# Patient Record
Sex: Male | Born: 1950 | Race: White | Hispanic: No | Marital: Married | Smoking: Former smoker
Health system: Southern US, Community
[De-identification: ages and names within clinical notes are randomized; demographics above are authoritative.]

## PROBLEM LIST (undated history)

## (undated) DIAGNOSIS — H9190 Unspecified hearing loss, unspecified ear: Secondary | ICD-10-CM

## (undated) DIAGNOSIS — N529 Male erectile dysfunction, unspecified: Secondary | ICD-10-CM

## (undated) DIAGNOSIS — N400 Enlarged prostate without lower urinary tract symptoms: Secondary | ICD-10-CM

## (undated) DIAGNOSIS — E039 Hypothyroidism, unspecified: Secondary | ICD-10-CM

## (undated) DIAGNOSIS — I10 Essential (primary) hypertension: Secondary | ICD-10-CM

## (undated) DIAGNOSIS — M549 Dorsalgia, unspecified: Secondary | ICD-10-CM

## (undated) DIAGNOSIS — I4891 Unspecified atrial fibrillation: Secondary | ICD-10-CM

## (undated) DIAGNOSIS — L570 Actinic keratosis: Secondary | ICD-10-CM

## (undated) DIAGNOSIS — E291 Testicular hypofunction: Secondary | ICD-10-CM

## (undated) DIAGNOSIS — I480 Paroxysmal atrial fibrillation: Secondary | ICD-10-CM

## (undated) HISTORY — DX: Essential (primary) hypertension: I10

## (undated) HISTORY — PX: TONSILLECTOMY: SUR1361

## (undated) HISTORY — DX: Actinic keratosis: L57.0

## (undated) HISTORY — DX: Benign prostatic hyperplasia without lower urinary tract symptoms: N40.0

## (undated) HISTORY — DX: Paroxysmal atrial fibrillation: I48.0

## (undated) HISTORY — DX: Testicular hypofunction: E29.1

## (undated) HISTORY — DX: Male erectile dysfunction, unspecified: N52.9

---

## 2005-04-15 HISTORY — PX: COLONOSCOPY: SHX174

## 2005-05-05 ENCOUNTER — Ambulatory Visit: Payer: Self-pay | Admitting: General Surgery

## 2005-05-08 ENCOUNTER — Ambulatory Visit: Payer: Self-pay | Admitting: General Surgery

## 2005-11-17 ENCOUNTER — Ambulatory Visit: Payer: Self-pay | Admitting: Physician Assistant

## 2013-03-10 ENCOUNTER — Encounter: Payer: Self-pay | Admitting: General Surgery

## 2013-03-22 ENCOUNTER — Encounter: Payer: Self-pay | Admitting: General Surgery

## 2013-05-05 ENCOUNTER — Encounter: Payer: Self-pay | Admitting: General Surgery

## 2013-05-05 ENCOUNTER — Ambulatory Visit (INDEPENDENT_AMBULATORY_CARE_PROVIDER_SITE_OTHER): Payer: BC Managed Care – PPO | Admitting: General Surgery

## 2013-05-05 VITALS — BP 130/70 | HR 78 | Resp 14 | Ht 73.0 in | Wt 210.0 lb

## 2013-05-05 DIAGNOSIS — Z1211 Encounter for screening for malignant neoplasm of colon: Secondary | ICD-10-CM

## 2013-05-05 MED ORDER — POLYETHYLENE GLYCOL 3350 17 GM/SCOOP PO POWD: ORAL | Status: DC

## 2013-05-05 NOTE — Progress Notes (Signed)
Patient ID: Alexander Carey, male   DOB: [birth year], 63 y.o.   MRN: [id number]  Chief Complaint  Patient presents with  . Other    colonoscopy    HPI Alexander Carey is a 62 y.o. male here for colonoscopy discussion. His last colonoscopy was done 04/2005. He reports no problems with the bowels since his last visit. Family history is notable in that his older brother has had colonic polyps. HPI  Past Medical History  Diagnosis Date  . Enlarged prostate     Past Surgical History  Procedure Laterality Date  . Colonoscopy  04/2005    Family History  Problem Relation Age of Onset  . Ovarian cancer Mother   . Throat cancer Father     Social History History  Substance Use Topics  . Smoking status: Former Smoker -- 0.50 packs/day    Types: Cigarettes    Quit date: 09/15/2008  . Smokeless tobacco: Never Used  . Alcohol Use: Yes    No Known Allergies  Current Outpatient Prescriptions  Medication Sig Dispense Refill  . aspirin 81 MG tablet Take 81 mg by mouth daily.      Marland Kitchen dutasteride (AVODART) 0.5 MG capsule Take 0.5 mg by mouth daily.      . fish oil-omega-3 fatty acids 1000 MG capsule Take 2 g by mouth daily.      . Multiple Vitamin (MULTIVITAMIN) tablet Take 1 tablet by mouth daily.      . tamsulosin (FLOMAX) 0.4 MG CAPS capsule Take 0.4 mg by mouth.      . polyethylene glycol powder (GLYCOLAX/MIRALAX) powder 255 grams one bottle for colonoscopy prep  255 g  0   No current facility-administered medications for this visit.    Review of Systems Review of Systems  Constitutional: Negative.   Respiratory: Negative.   Cardiovascular: Negative.     Blood pressure 130/70, pulse 78, resp. rate 14, height 6\' 1"  (1.854 m), weight 210 lb (95.255 kg).  Physical Exam Physical Exam  Neck: Neck supple.  Cardiovascular: Normal rate and regular rhythm.   Pulmonary/Chest: Effort normal and breath sounds normal.  Lymphadenopathy:    He has no cervical adenopathy.    Data  Reviewed Colonoscopy in 2006 was unremarkable.  Assessment    Candidate for screening colonoscopy.     Plan    The procedure was reviewed. This will be scheduled convenient day.     Patient has been scheduled for a colonoscopy on 06-29-13 at [facility]. It is okay for patient to continue 81 mg aspirin. Patient has been asked to discontinue fish oil one week prior to procedure.    Earline Mayotte 05/06/2013, 7:48 PM

## 2013-05-05 NOTE — Patient Instructions (Addendum)
Colonoscopy A colonoscopy is an exam to evaluate your entire colon. In this exam, your colon is cleansed. A long fiberoptic tube is inserted through your rectum and into your colon. The fiberoptic scope (endoscope) is a long bundle of enclosed and very flexible fibers. These fibers transmit light to the area examined and send images from that area to your caregiver. Discomfort is usually minimal. You may be given a drug to help you sleep (sedative) during or prior to the procedure. This exam helps to detect lumps (tumors), polyps, inflammation, and areas of bleeding. Your caregiver may also take a small piece of tissue (biopsy) that will be examined under a microscope. LET YOUR CAREGIVER KNOW ABOUT:   Allergies to food or medicine.  Medicines taken, including vitamins, herbs, eyedrops, over-the-counter medicines, and creams.  Use of steroids (by mouth or creams).  Previous problems with anesthetics or numbing medicines.  History of bleeding problems or blood clots.  Previous surgery.  Other health problems, including diabetes and kidney problems.  Possibility of pregnancy, if this applies. BEFORE THE PROCEDURE   A clear liquid diet may be required for 2 days before the exam.  Ask your caregiver about changing or stopping your regular medications.  Liquid injections (enemas) or laxatives may be required.  A large amount of electrolyte solution may be given to you to drink over a short period of time. This solution is used to clean out your colon.  You should be present 60 minutes prior to your procedure or as directed by your caregiver. AFTER THE PROCEDURE   If you received a sedative or pain relieving medication, you will need to arrange for someone to drive you home.  Occasionally, there is a little blood passed with the first bowel movement. Do not be concerned. FINDING OUT THE RESULTS OF YOUR TEST Not all test results are available during your visit. If your test results are  not back during the visit, make an appointment with your caregiver to find out the results. Do not assume everything is normal if you have not heard from your caregiver or the medical facility. It is important for you to follow up on all of your test results. HOME CARE INSTRUCTIONS   It is not unusual to pass moderate amounts of gas and experience mild abdominal cramping following the procedure. This is due to air being used to inflate your colon during the exam. Walking or a warm pack on your belly (abdomen) may help.  You may resume all normal meals and activities after sedatives and medicines have worn off.  Only take over-the-counter or prescription medicines for pain, discomfort, or fever as directed by your caregiver. Do not use aspirin or blood thinners if a biopsy was taken. Consult your caregiver for medicine usage if biopsies were taken. SEEK IMMEDIATE MEDICAL CARE IF:   You have a fever.  You pass large blood clots or fill a toilet with blood following the procedure. This may also occur 10 to 14 days following the procedure. This is more likely if a biopsy was taken.  You develop abdominal pain that keeps getting worse and cannot be relieved with medicine. Document Released: 08/29/2000 Document Revised: 11/24/2011 Document Reviewed: 04/13/2008 [facility] Patient Information 2014 [place], [place].  Patient has been scheduled for a colonoscopy on 06-29-13 at [facility]. It is okay for patient to continue 81 mg aspirin. Patient has been asked to discontinue fish oil one week prior to procedure.

## 2013-05-06 ENCOUNTER — Encounter: Payer: Self-pay | Admitting: General Surgery

## 2013-06-23 ENCOUNTER — Other Ambulatory Visit: Payer: Self-pay | Admitting: General Surgery

## 2013-06-23 ENCOUNTER — Telehealth: Payer: Self-pay | Admitting: *Deleted

## 2013-06-23 DIAGNOSIS — Z1211 Encounter for screening for malignant neoplasm of colon: Secondary | ICD-10-CM

## 2013-06-23 NOTE — Telephone Encounter (Signed)
Patient reports no change in medications since last office visit. He was instructed to pre-register by Friday since he has not done so already. We will proceed with colonoscopy that is scheduled for 06-29-13 at [facility]. He was instructed to call the office if he has any questions.

## 2013-06-29 ENCOUNTER — Ambulatory Visit: Payer: Self-pay | Admitting: General Surgery

## 2013-06-29 DIAGNOSIS — Z1211 Encounter for screening for malignant neoplasm of colon: Secondary | ICD-10-CM

## 2013-06-30 ENCOUNTER — Encounter: Payer: Self-pay | Admitting: General Surgery

## 2013-07-18 ENCOUNTER — Ambulatory Visit: Payer: Self-pay | Admitting: Orthopedic Surgery

## 2014-02-01 DIAGNOSIS — M5136 Other intervertebral disc degeneration, lumbar region: Secondary | ICD-10-CM | POA: Insufficient documentation

## 2014-02-01 DIAGNOSIS — M5116 Intervertebral disc disorders with radiculopathy, lumbar region: Secondary | ICD-10-CM | POA: Insufficient documentation

## 2014-02-01 DIAGNOSIS — M48061 Spinal stenosis, lumbar region without neurogenic claudication: Secondary | ICD-10-CM | POA: Insufficient documentation

## 2014-02-01 DIAGNOSIS — M47816 Spondylosis without myelopathy or radiculopathy, lumbar region: Secondary | ICD-10-CM | POA: Insufficient documentation

## 2015-07-26 ENCOUNTER — Ambulatory Visit: Payer: Self-pay | Admitting: Obstetrics and Gynecology

## 2015-08-01 ENCOUNTER — Encounter: Payer: Self-pay | Admitting: *Deleted

## 2015-08-01 DIAGNOSIS — K30 Functional dyspepsia: Secondary | ICD-10-CM | POA: Insufficient documentation

## 2015-08-01 DIAGNOSIS — R195 Other fecal abnormalities: Secondary | ICD-10-CM | POA: Insufficient documentation

## 2015-08-01 DIAGNOSIS — N400 Enlarged prostate without lower urinary tract symptoms: Secondary | ICD-10-CM | POA: Insufficient documentation

## 2015-08-01 DIAGNOSIS — J302 Other seasonal allergic rhinitis: Secondary | ICD-10-CM | POA: Insufficient documentation

## 2015-08-01 DIAGNOSIS — E785 Hyperlipidemia, unspecified: Secondary | ICD-10-CM | POA: Insufficient documentation

## 2015-08-02 ENCOUNTER — Ambulatory Visit (INDEPENDENT_AMBULATORY_CARE_PROVIDER_SITE_OTHER): Payer: PPO | Admitting: Obstetrics and Gynecology

## 2015-08-02 ENCOUNTER — Encounter: Payer: Self-pay | Admitting: Obstetrics and Gynecology

## 2015-08-02 VITALS — BP 161/101 | HR 69 | Resp 16 | Ht 71.0 in | Wt 213.3 lb

## 2015-08-02 DIAGNOSIS — N400 Enlarged prostate without lower urinary tract symptoms: Secondary | ICD-10-CM

## 2015-08-02 LAB — BLADDER SCAN AMB NON-IMAGING

## 2015-08-02 MED ORDER — DUTASTERIDE 0.5 MG PO CAPS: 0.5000 mg | ORAL_CAPSULE | Freq: Every day | ORAL | Status: DC

## 2015-08-02 MED ORDER — TAMSULOSIN HCL 0.4 MG PO CAPS: 0.4000 mg | ORAL_CAPSULE | Freq: Every day | ORAL | Status: DC

## 2015-08-02 NOTE — Progress Notes (Addendum)
08/02/2015 9:36 AM   Alexander Carey April 05, 1951 [id number]  Referring provider: Rocco Serene, MD [place], [place] [address]  Chief Complaint  Patient presents with  . Benign Prostatic Hypertrophy    HPI: Patient is a 64 year old male presenting today for his 1 year follow-up for BPH. Patient reports that urinary symptoms have been well controlled on daily Avodart and Flomax for at least 3 years. He states that his urinary symptoms have remained stable. He reports no adverse side effects of medications and would like to continue current treatment.    The current IPSS (International Prostate Symptom Score) is 12 and QOL (Quality of Life) score due to urinary symptoms is 1 pleased. Last Visit: 07/2014  PSA History 12/2012   PSA 0.2 07/2014 PSA 0.3  PMH: Past Medical History  Diagnosis Date  . Enlarged prostate   . ED (erectile dysfunction)   . Hypogonadism in male     Surgical History: Past Surgical History  Procedure Laterality Date  . Colonoscopy  04/2005  . Tonsillectomy      Home Medications:    Medication List       This list is accurate as of: 08/02/15  9:36 AM.  Always use your most recent med list.               aspirin 81 MG tablet  Take 81 mg by mouth daily.     dutasteride 0.5 MG capsule  Commonly known as:  AVODART  Take 1 capsule (0.5 mg total) by mouth daily.     multivitamin tablet  Take 1 tablet by mouth daily.     tamsulosin 0.4 MG Caps capsule  Commonly known as:  FLOMAX  Take 1 capsule (0.4 mg total) by mouth daily.        Allergies:  Allergies  Allergen Reactions  . Ciprofloxacin     Other reaction(s): Unknown    Family History: Family History  Problem Relation Age of Onset  . Ovarian cancer Mother   . Throat cancer Father     Social History:  reports that he quit smoking about 6 years ago. His smoking use included Cigarettes. He smoked 0.50 packs per day. He has never used smokeless tobacco. He reports  that he drinks alcohol. He reports that he does not use illicit drugs.  ROS: UROLOGY Frequent Urination?: No Hard to postpone urination?: No Burning/pain with urination?: No Get up at night to urinate?: No Leakage of urine?: No Urine stream starts and stops?: No Trouble starting stream?: No Do you have to strain to urinate?: No Blood in urine?: No Urinary tract infection?: No Sexually transmitted disease?: No Injury to kidneys or bladder?: No Painful intercourse?: No Weak stream?: No Erection problems?: No Penile pain?: No  Gastrointestinal Nausea?: No Vomiting?: No Indigestion/heartburn?: No Diarrhea?: No Constipation?: No  Constitutional Fever: No Night sweats?: No Weight loss?: No Fatigue?: No  Skin Skin rash/lesions?: No Itching?: No  Eyes Blurred vision?: No Double vision?: No  Ears/Nose/Throat Sore throat?: No Sinus problems?: No  Hematologic/Lymphatic Swollen glands?: No Easy bruising?: No  Cardiovascular Leg swelling?: No Chest pain?: No  Respiratory Cough?: No Shortness of breath?: No  Endocrine Excessive thirst?: No  Musculoskeletal Back pain?: No Joint pain?: No  Neurological Headaches?: No Dizziness?: No  Psychologic Depression?: No Anxiety?: No  Physical Exam: BP 161/101 mmHg  Pulse 69  Resp 16  Ht 5\' 11"  (1.803 m)  Wt 213 lb 4.8 oz (96.752 kg)  BMI 29.76 kg/m2  Constitutional:  Alert and oriented, No acute distress. HEENT: [place] AT, moist mucus membranes.  Trachea midline, no masses. Cardiovascular: No clubbing, cyanosis, or edema. Respiratory: Normal respiratory effort, no increased work of breathing. GI: Abdomen is soft, nontender, nondistended, no abdominal masses GU:  DRE: prostate normal in size and smooth, nontender Skin: No rashes, bruises or suspicious lesions. Lymph: No cervical adenopathy. Neurologic: Grossly intact, no focal deficits, moving all 4 extremities. Psychiatric: Normal mood and  affect.  Laboratory Data:   Urinalysis Results for orders placed or performed in visit on 08/02/15  BLADDER SCAN AMB NON-IMAGING  Result Value Ref Range   Scan Result 12 mL     Pertinent Imaging:   Assessment & Plan:   1. BPH (benign prostatic hyperplasia)-  PVR 92mL. Patient's urinary symptoms are well managed on Flomax and Avodart. We will continue these medications and refill sent to his pharmacy today. PSA checked today. DRE unremarkable. Patient to follow up in 1 year for DRE and PSA. - BLADDER SCAN AMB NON-IMAGING -Flomax -Advodart   Return in about 1 year (around 08/01/2016) for DRE/PSA/I-PSS.  These notes generated with voice recognition software. I apologize for typographical errors.  Herbert Moors, [facility] [address], [place],  [address] [phone]

## 2015-08-02 NOTE — Addendum Note (Signed)
Addended by: Roda Shutters on: 08/02/2015 09:39 AM   Modules accepted: Level of Service

## 2015-08-03 LAB — PSA: PROSTATE SPECIFIC AG, SERUM: 0.3 ng/mL (ref 0.0–4.0)

## 2015-08-06 ENCOUNTER — Telehealth: Payer: Self-pay

## 2015-08-06 NOTE — Telephone Encounter (Signed)
LMOM- labs are good and well see in 1 year.

## 2015-08-06 NOTE — Telephone Encounter (Signed)
-----   Message from Roda Shutters, [place] sent at 08/03/2015  9:24 AM EST ----- Please notify patient that his PSA 0.3 which is within normal limits and stable from last years. We will see him in 1 year as scheduled.  thanks

## 2015-11-29 DIAGNOSIS — J019 Acute sinusitis, unspecified: Secondary | ICD-10-CM | POA: Diagnosis not present

## 2015-11-29 DIAGNOSIS — B9689 Other specified bacterial agents as the cause of diseases classified elsewhere: Secondary | ICD-10-CM | POA: Diagnosis not present

## 2016-01-21 DIAGNOSIS — K130 Diseases of lips: Secondary | ICD-10-CM | POA: Diagnosis not present

## 2016-07-16 DIAGNOSIS — L578 Other skin changes due to chronic exposure to nonionizing radiation: Secondary | ICD-10-CM | POA: Diagnosis not present

## 2016-07-16 DIAGNOSIS — I8311 Varicose veins of right lower extremity with inflammation: Secondary | ICD-10-CM | POA: Diagnosis not present

## 2016-07-16 DIAGNOSIS — L821 Other seborrheic keratosis: Secondary | ICD-10-CM | POA: Diagnosis not present

## 2016-07-16 DIAGNOSIS — Z1283 Encounter for screening for malignant neoplasm of skin: Secondary | ICD-10-CM | POA: Diagnosis not present

## 2016-07-16 DIAGNOSIS — B353 Tinea pedis: Secondary | ICD-10-CM | POA: Diagnosis not present

## 2016-07-16 DIAGNOSIS — B351 Tinea unguium: Secondary | ICD-10-CM | POA: Diagnosis not present

## 2016-07-16 DIAGNOSIS — L570 Actinic keratosis: Secondary | ICD-10-CM | POA: Diagnosis not present

## 2016-07-16 DIAGNOSIS — L853 Xerosis cutis: Secondary | ICD-10-CM | POA: Diagnosis not present

## 2016-07-16 DIAGNOSIS — L309 Dermatitis, unspecified: Secondary | ICD-10-CM | POA: Diagnosis not present

## 2016-07-16 DIAGNOSIS — L718 Other rosacea: Secondary | ICD-10-CM | POA: Diagnosis not present

## 2016-07-16 DIAGNOSIS — L812 Freckles: Secondary | ICD-10-CM | POA: Diagnosis not present

## 2016-09-21 NOTE — Progress Notes (Signed)
09/22/2016 9:16 AM   [name] [birth year] [id number]  Referring provider: Rocco Serene, MD [facility] [place], [place] [address]  Chief Complaint  Patient presents with  . Benign Prostatic Hypertrophy    1 year follow up needs med refill    HPI: Patient is a 66 year old Caucasian male with BPH with LU TS and erectile dysfunction who presents today for yearly follow-up.  BPH WITH LUTS His IPSS score today is 8, which is moderate lower urinary tract symptomatology.  He is pleased with his quality life due to his urinary symptoms.  His previous IPSS score was 12/1.   His major complaints today are intermittency and a weak urinary stream.  He has had these symptoms for the last few years.  He denies any dysuria, hematuria or suprapubic pain.   He currently taking tamsulosin 0.4 mg and dutasteride 0.5 mg daily.  He also denies any recent fevers, chills, nausea or vomiting.  He does not have a family history of PCa.     IPSS    Row Name 09/22/16 0900         International Prostate Symptom Score   How often have you had the sensation of not emptying your bladder? Less than 1 in 5     How often have you had to urinate less than every two hours? Less than 1 in 5 times     How often have you found you stopped and started again several times when you urinated? Less than half the time     How often have you found it difficult to postpone urination? Not at All     How often have you had a weak urinary stream? Less than half the time     How often have you had to strain to start urination? Less than 1 in 5 times     How many times did you typically get up at night to urinate? 1 Time     Total IPSS Score 8       Quality of Life due to urinary symptoms   If you were to spend the rest of your life with your urinary condition just the way it is now how would you feel about that? Pleased        Score:  1-7 Mild 8-19 Moderate 20-35 Severe  Erectile dysfunction His SHIM  score is 16, which is mild to moderate ED.   He has been having difficulty with erections for several years.   His major complaint is lack of firmness.  His libido is preserved.   His risk factors for ED are age, BPH, HTN and HLD.  He denies any painful erections or curvatures with his erections.   He has tried Cialis in the past, he would like to have a prescription.       SHIM    Row Name 09/22/16 0901         SHIM: Over the last 6 months:   How do you rate your confidence that you could get and keep an erection? Moderate     When you had erections with sexual stimulation, how often were your erections hard enough for penetration (entering your partner)? Sometimes (about half the time)     During sexual intercourse, how often were you able to maintain your erection after you had penetrated (entered) your partner? Difficult     During sexual intercourse, how difficult was it to maintain your erection to completion of intercourse?  Slightly Difficult     When you attempted sexual intercourse, how often was it satisfactory for you? Difficult       SHIM Total Score   SHIM 16        Score: 1-7 Severe ED 8-11 Moderate ED 12-16 Mild-Moderate ED 17-21 Mild ED 22-25 No ED    PMH: Past Medical History:  Diagnosis Date  . ED (erectile dysfunction)   . Enlarged prostate   . Hypogonadism in male     Surgical History: Past Surgical History:  Procedure Laterality Date  . COLONOSCOPY  04/2005  . TONSILLECTOMY      Home Medications:  Allergies as of 09/22/2016      Reactions   Ciprofloxacin    Other reaction(s): Unknown      Medication List       Accurate as of 09/22/16  9:16 AM. Always use your most recent med list.          aspirin 81 MG tablet Take 81 mg by mouth daily.   dutasteride 0.5 MG capsule Commonly known as:  AVODART Take 1 capsule (0.5 mg total) by mouth daily.   mometasone 0.1 % ointment Commonly known as:  ELOCON Apply topically.   multivitamin  tablet Take 1 tablet by mouth daily.   tadalafil 20 MG tablet Commonly known as:  CIALIS Take 1 tablet (20 mg total) by mouth daily as needed for erectile dysfunction.   tamsulosin 0.4 MG Caps capsule Commonly known as:  FLOMAX Take 1 capsule (0.4 mg total) by mouth daily.       Allergies:  Allergies  Allergen Reactions  . Ciprofloxacin     Other reaction(s): Unknown    Family History: Family History  Problem Relation Age of Onset  . Ovarian cancer Mother   . Throat cancer Father   . Prostate cancer Neg Hx   . Kidney cancer Neg Hx     Social History:  reports that he quit smoking about 8 years ago. His smoking use included Cigarettes. He smoked 0.50 packs per day. He has never used smokeless tobacco. He reports that he drinks alcohol. He reports that he does not use drugs.  ROS: UROLOGY Frequent Urination?: No Hard to postpone urination?: No Burning/pain with urination?: No Get up at night to urinate?: No Leakage of urine?: No Urine stream starts and stops?: No Trouble starting stream?: No Do you have to strain to urinate?: No Blood in urine?: No Urinary tract infection?: No Sexually transmitted disease?: No Injury to kidneys or bladder?: No Painful intercourse?: No Weak stream?: No Erection problems?: No Penile pain?: No  Gastrointestinal Nausea?: No Vomiting?: No Indigestion/heartburn?: No Diarrhea?: No Constipation?: No  Constitutional Fever: No Night sweats?: No Weight loss?: No Fatigue?: No  Skin Skin rash/lesions?: No Itching?: No  Eyes Blurred vision?: No Double vision?: No  Ears/Nose/Throat Sore throat?: No Sinus problems?: No  Hematologic/Lymphatic Swollen glands?: No Easy bruising?: No  Cardiovascular Leg swelling?: No Chest pain?: No  Respiratory Cough?: No Shortness of breath?: No  Endocrine Excessive thirst?: No  Musculoskeletal Back pain?: No Joint pain?: No  Neurological Headaches?: No Dizziness?:  No  Psychologic Depression?: No Anxiety?: No  Physical Exam: BP (!) 163/82   Pulse 75   Ht 6' (1.829 m)   Wt 209 lb 6.4 oz (95 kg)   BMI 28.40 kg/m   Constitutional: Well nourished. Alert and oriented, No acute distress. HEENT: [place] AT, moist mucus membranes. Trachea midline, no masses. Cardiovascular: No clubbing, cyanosis, or edema.  Respiratory: Normal respiratory effort, no increased work of breathing. GI: Abdomen is soft, non tender, non distended, no abdominal masses. Liver and spleen not palpable.  No hernias appreciated.  Stool sample for occult testing is not indicated.   GU: No CVA tenderness.  No bladder fullness or masses.  Patient with uncircumcised phallus.  Foreskin easily retracted  Urethral meatus is patent.  No penile discharge. No penile lesions or rashes. Scrotum without lesions, cysts, rashes and/or edema.  Testicles are located scrotally bilaterally. No masses are appreciated in the testicles. Left and right epididymis are normal. Rectal: Patient with  normal sphincter tone. Anus and perineum without scarring or rashes. No rectal masses are appreciated. Prostate is approximately 45 grams, no nodules are appreciated. Seminal vesicles are normal. Skin: No rashes, bruises or suspicious lesions. Lymph: No cervical or inguinal adenopathy. Neurologic: Grossly intact, no focal deficits, moving all 4 extremities. Psychiatric: Normal mood and affect.  Laboratory Data: PSA History 12/2012   PSA 0.2 07/2014 PSA 0.3   Assessment & Plan:    1. BPH with LUTS  - IPSS score is 8/1, it is improving  - Continue conservative management, avoiding bladder irritants and timed voiding's  - Continue tamsulosin 0.4 mg daily and dutasteride 0.5 mg daily; refills given  - RTC in 12 months for IPSS, PSA and exam    2. Erectile dysfunction  - SHIM score is 16   - Continue Cialis; script sent to pharmacy  - RTC in 12 months for repeat SHIM score and exam    Return in about 1 year  (around 09/22/2017) for IPSS, SHIM, PSA and exam.  These notes generated with voice recognition software. I apologize for typographical errors.  Zara Council, [facility] [address], [place], [place] [address] [phone]

## 2016-09-22 ENCOUNTER — Encounter: Payer: Self-pay | Admitting: Urology

## 2016-09-22 ENCOUNTER — Ambulatory Visit: Payer: PPO | Admitting: Urology

## 2016-09-22 VITALS — BP 163/82 | HR 75 | Ht 72.0 in | Wt 209.4 lb

## 2016-09-22 DIAGNOSIS — N401 Enlarged prostate with lower urinary tract symptoms: Secondary | ICD-10-CM | POA: Diagnosis not present

## 2016-09-22 DIAGNOSIS — L2389 Allergic contact dermatitis due to other agents: Secondary | ICD-10-CM | POA: Diagnosis not present

## 2016-09-22 DIAGNOSIS — N138 Other obstructive and reflux uropathy: Secondary | ICD-10-CM | POA: Diagnosis not present

## 2016-09-22 DIAGNOSIS — N529 Male erectile dysfunction, unspecified: Secondary | ICD-10-CM | POA: Diagnosis not present

## 2016-09-22 DIAGNOSIS — L2089 Other atopic dermatitis: Secondary | ICD-10-CM | POA: Diagnosis not present

## 2016-09-22 DIAGNOSIS — N400 Enlarged prostate without lower urinary tract symptoms: Secondary | ICD-10-CM | POA: Diagnosis not present

## 2016-09-22 DIAGNOSIS — L308 Other specified dermatitis: Secondary | ICD-10-CM | POA: Diagnosis not present

## 2016-09-22 MED ORDER — DUTASTERIDE 0.5 MG PO CAPS: 0.5000 mg | ORAL_CAPSULE | Freq: Every day | ORAL | 3 refills | Status: DC

## 2016-09-22 MED ORDER — TAMSULOSIN HCL 0.4 MG PO CAPS
0.4000 mg | ORAL_CAPSULE | Freq: Every day | ORAL | 12 refills | Status: DC
Start: 2016-09-22 — End: 2016-09-22

## 2016-09-22 MED ORDER — DUTASTERIDE 0.5 MG PO CAPS: 0.5000 mg | ORAL_CAPSULE | Freq: Every day | ORAL | 12 refills | Status: DC

## 2016-09-22 MED ORDER — TAMSULOSIN HCL 0.4 MG PO CAPS
0.4000 mg | ORAL_CAPSULE | Freq: Every day | ORAL | 3 refills | Status: DC
Start: 2016-09-22 — End: 2017-09-22

## 2016-09-22 MED ORDER — TADALAFIL 20 MG PO TABS: 20.0000 mg | ORAL_TABLET | Freq: Every day | ORAL | 12 refills | Status: DC | PRN

## 2016-09-23 LAB — PSA: PROSTATE SPECIFIC AG, SERUM: 0.4 ng/mL (ref 0.0–4.0)

## 2016-09-25 ENCOUNTER — Telehealth: Payer: Self-pay

## 2016-09-25 NOTE — Telephone Encounter (Signed)
LMOM

## 2016-09-25 NOTE — Telephone Encounter (Signed)
-----   Message from Nori Riis, PA-C sent at 09/24/2016  7:43 AM EST ----- Please notify the patient that his PSA is stable at 0.4 ng/mL.  We will see him in one year.

## 2016-09-25 NOTE — Telephone Encounter (Signed)
Spoke with pt in reference to PSA results. Pt voiced understanding.  

## 2016-10-06 DIAGNOSIS — M19011 Primary osteoarthritis, right shoulder: Secondary | ICD-10-CM | POA: Diagnosis not present

## 2016-10-06 DIAGNOSIS — M542 Cervicalgia: Secondary | ICD-10-CM | POA: Diagnosis not present

## 2016-10-06 DIAGNOSIS — M25511 Pain in right shoulder: Secondary | ICD-10-CM | POA: Diagnosis not present

## 2016-10-06 DIAGNOSIS — M47812 Spondylosis without myelopathy or radiculopathy, cervical region: Secondary | ICD-10-CM | POA: Insufficient documentation

## 2016-10-06 DIAGNOSIS — L300 Nummular dermatitis: Secondary | ICD-10-CM | POA: Diagnosis not present

## 2016-10-06 DIAGNOSIS — M7581 Other shoulder lesions, right shoulder: Secondary | ICD-10-CM | POA: Insufficient documentation

## 2016-10-07 ENCOUNTER — Other Ambulatory Visit: Payer: Self-pay | Admitting: Surgery

## 2016-10-07 DIAGNOSIS — M25511 Pain in right shoulder: Secondary | ICD-10-CM

## 2016-10-14 DIAGNOSIS — D2372 Other benign neoplasm of skin of left lower limb, including hip: Secondary | ICD-10-CM | POA: Diagnosis not present

## 2016-10-14 DIAGNOSIS — M79672 Pain in left foot: Secondary | ICD-10-CM | POA: Diagnosis not present

## 2016-10-22 ENCOUNTER — Ambulatory Visit
Admission: RE | Admit: 2016-10-22 | Discharge: 2016-10-22 | Disposition: A | Discharge status: Home / Self Care | Payer: PPO | Source: Ambulatory Visit | Attending: Surgery | Admitting: Surgery

## 2016-10-22 DIAGNOSIS — M25511 Pain in right shoulder: Secondary | ICD-10-CM | POA: Insufficient documentation

## 2016-10-22 DIAGNOSIS — M25811 Other specified joint disorders, right shoulder: Secondary | ICD-10-CM | POA: Insufficient documentation

## 2016-10-22 DIAGNOSIS — M25411 Effusion, right shoulder: Secondary | ICD-10-CM | POA: Diagnosis not present

## 2016-10-22 DIAGNOSIS — M75101 Unspecified rotator cuff tear or rupture of right shoulder, not specified as traumatic: Secondary | ICD-10-CM | POA: Insufficient documentation

## 2016-10-22 DIAGNOSIS — M24011 Loose body in right shoulder: Secondary | ICD-10-CM | POA: Insufficient documentation

## 2016-10-22 DIAGNOSIS — M19011 Primary osteoarthritis, right shoulder: Secondary | ICD-10-CM | POA: Diagnosis not present

## 2016-10-27 DIAGNOSIS — M75111 Incomplete rotator cuff tear or rupture of right shoulder, not specified as traumatic: Secondary | ICD-10-CM | POA: Diagnosis not present

## 2016-10-27 DIAGNOSIS — M720 Palmar fascial fibromatosis [Dupuytren]: Secondary | ICD-10-CM | POA: Diagnosis not present

## 2016-10-27 DIAGNOSIS — M19011 Primary osteoarthritis, right shoulder: Secondary | ICD-10-CM | POA: Diagnosis not present

## 2016-10-27 DIAGNOSIS — M7581 Other shoulder lesions, right shoulder: Secondary | ICD-10-CM | POA: Diagnosis not present

## 2016-11-04 DIAGNOSIS — M79672 Pain in left foot: Secondary | ICD-10-CM | POA: Diagnosis not present

## 2016-11-04 DIAGNOSIS — D2372 Other benign neoplasm of skin of left lower limb, including hip: Secondary | ICD-10-CM | POA: Diagnosis not present

## 2016-11-26 ENCOUNTER — Encounter: Payer: Self-pay | Admitting: *Deleted

## 2016-12-03 ENCOUNTER — Ambulatory Visit
Admission: RE | Admit: 2016-12-03 | Discharge: 2016-12-03 | Disposition: A | Discharge status: Home / Self Care | Payer: PPO | Source: Ambulatory Visit | Attending: Surgery | Admitting: Surgery

## 2016-12-03 ENCOUNTER — Ambulatory Visit: Payer: PPO | Admitting: Anesthesiology

## 2016-12-03 ENCOUNTER — Encounter
Admission: RE | Disposition: A | Discharge status: Home / Self Care | Payer: Self-pay | Source: Ambulatory Visit | Attending: Surgery

## 2016-12-03 DIAGNOSIS — Z7982 Long term (current) use of aspirin: Secondary | ICD-10-CM | POA: Diagnosis not present

## 2016-12-03 DIAGNOSIS — M720 Palmar fascial fibromatosis [Dupuytren]: Secondary | ICD-10-CM | POA: Insufficient documentation

## 2016-12-03 DIAGNOSIS — N400 Enlarged prostate without lower urinary tract symptoms: Secondary | ICD-10-CM | POA: Diagnosis not present

## 2016-12-03 DIAGNOSIS — Z881 Allergy status to other antibiotic agents status: Secondary | ICD-10-CM | POA: Insufficient documentation

## 2016-12-03 DIAGNOSIS — Z79899 Other long term (current) drug therapy: Secondary | ICD-10-CM | POA: Insufficient documentation

## 2016-12-03 DIAGNOSIS — Z87891 Personal history of nicotine dependence: Secondary | ICD-10-CM | POA: Insufficient documentation

## 2016-12-03 HISTORY — PX: TRIGGER FINGER RELEASE: SHX641

## 2016-12-03 SURGERY — RELEASE, A1 PULLEY, FOR TRIGGER FINGER
Anesthesia: Monitor Anesthesia Care | Laterality: Left | Wound class: Clean

## 2016-12-03 MED ORDER — ROPIVACAINE HCL 5 MG/ML IJ SOLN
INTRAMUSCULAR | Status: DC | PRN
  Administered 2016-12-03: 40 mL via PERINEURAL

## 2016-12-03 MED ORDER — OXYCODONE HCL 5 MG PO TABS: 5.0000 mg | ORAL_TABLET | Freq: Once | ORAL | Status: DC | PRN

## 2016-12-03 MED ORDER — LACTATED RINGERS IV SOLN
INTRAVENOUS | Status: DC
  Administered 2016-12-03: 12:00:00 via INTRAVENOUS

## 2016-12-03 MED ORDER — BUPIVACAINE HCL (PF) 0.25 % IJ SOLN
INTRAMUSCULAR | Status: DC | PRN
  Administered 2016-12-03: 10 mL

## 2016-12-03 MED ORDER — HYDROCODONE-ACETAMINOPHEN 5-325 MG PO TABS: 1.0000 | ORAL_TABLET | Freq: Four times a day (QID) | ORAL | 0 refills | Status: DC | PRN

## 2016-12-03 MED ORDER — LIDOCAINE HCL (CARDIAC) 20 MG/ML IV SOLN
INTRAVENOUS | Status: DC | PRN
  Administered 2016-12-03: 50 mg via INTRAVENOUS

## 2016-12-03 MED ORDER — DEXAMETHASONE SODIUM PHOSPHATE 4 MG/ML IJ SOLN
INTRAMUSCULAR | Status: DC | PRN
  Administered 2016-12-03: 4 mg via PERINEURAL

## 2016-12-03 MED ORDER — FENTANYL CITRATE (PF) 100 MCG/2ML IJ SOLN
INTRAMUSCULAR | Status: DC | PRN
  Administered 2016-12-03: 100 ug via INTRAVENOUS

## 2016-12-03 MED ORDER — PROMETHAZINE HCL 25 MG/ML IJ SOLN: 6.2500 mg | INTRAMUSCULAR | Status: DC | PRN

## 2016-12-03 MED ORDER — PROPOFOL 500 MG/50ML IV EMUL
INTRAVENOUS | Status: DC | PRN
  Administered 2016-12-03: 75 ug/kg/min via INTRAVENOUS

## 2016-12-03 MED ORDER — OXYCODONE HCL 5 MG/5ML PO SOLN: 5.0000 mg | Freq: Once | ORAL | Status: DC | PRN

## 2016-12-03 MED ORDER — MIDAZOLAM HCL 2 MG/2ML IJ SOLN
INTRAMUSCULAR | Status: DC | PRN
  Administered 2016-12-03: 2 mg via INTRAVENOUS

## 2016-12-03 MED ORDER — MEPERIDINE HCL 25 MG/ML IJ SOLN: 6.2500 mg | INTRAMUSCULAR | Status: DC | PRN

## 2016-12-03 MED ORDER — CEFAZOLIN SODIUM-DEXTROSE 2-4 GM/100ML-% IV SOLN
2.0000 g | Freq: Once | INTRAVENOUS | Status: AC
  Administered 2016-12-03: 2 g via INTRAVENOUS

## 2016-12-03 MED ORDER — HYDROMORPHONE HCL 1 MG/ML IJ SOLN: 0.2500 mg | INTRAMUSCULAR | Status: DC | PRN

## 2016-12-03 SURGICAL SUPPLY — 26 items
BANDAGE ELASTIC 2 LF NS (GAUZE/BANDAGES/DRESSINGS) IMPLANT
BANDAGE ELASTIC 3 LF NS (GAUZE/BANDAGES/DRESSINGS) ×3 IMPLANT
BNDG ESMARK 4X12 TAN STRL LF (GAUZE/BANDAGES/DRESSINGS) ×3 IMPLANT
CHLORAPREP W/TINT 26ML (MISCELLANEOUS) ×3 IMPLANT
CORD BIP STRL DISP 12FT (MISCELLANEOUS) ×3 IMPLANT
COVER LIGHT HANDLE UNIVERSAL (MISCELLANEOUS) ×6 IMPLANT
CUFF TOURNIQUET DUAL PORT 18X3 (MISCELLANEOUS) ×3 IMPLANT
DECANTER SPIKE VIAL GLASS SM (MISCELLANEOUS) IMPLANT
GAUZE PETRO XEROFOAM 1X8 (MISCELLANEOUS) ×3 IMPLANT
GAUZE SPONGE 4X4 12PLY STRL (GAUZE/BANDAGES/DRESSINGS) ×3 IMPLANT
GLOVE BIO SURGEON STRL SZ8 (GLOVE) ×6 IMPLANT
GLOVE INDICATOR 8.0 STRL GRN (GLOVE) ×3 IMPLANT
GOWN STRL REUS W/ TWL LRG LVL3 (GOWN DISPOSABLE) ×1 IMPLANT
GOWN STRL REUS W/ TWL XL LVL3 (GOWN DISPOSABLE) ×1 IMPLANT
GOWN STRL REUS W/TWL LRG LVL3 (GOWN DISPOSABLE) ×2
GOWN STRL REUS W/TWL XL LVL3 (GOWN DISPOSABLE) ×2
KIT ROOM TURNOVER OR (KITS) ×3 IMPLANT
NS IRRIG 500ML POUR BTL (IV SOLUTION) ×3 IMPLANT
PACK EXTREMITY ARMC (MISCELLANEOUS) ×3 IMPLANT
SLING ARM LRG DEEP (SOFTGOODS) ×3 IMPLANT
SPLINT CAST 1 STEP 3X12 (MISCELLANEOUS) ×3 IMPLANT
STOCKINETTE IMPERVIOUS 9X36 MD (GAUZE/BANDAGES/DRESSINGS) ×3 IMPLANT
STRAP BODY AND KNEE 60X3 (MISCELLANEOUS) ×3 IMPLANT
SUT PROLENE 4 0 PS 2 18 (SUTURE) ×9 IMPLANT
SUT VIC AB 3-0 SH 27 (SUTURE)
SUT VIC AB 3-0 SH 27X BRD (SUTURE) IMPLANT

## 2016-12-03 NOTE — Discharge Instructions (Addendum)
General Anesthesia, Adult, Care After These instructions provide you with information about caring for yourself after your procedure. Your health care provider may also give you more specific instructions. Your treatment has been planned according to current medical practices, but problems sometimes occur. Call your health care provider if you have any problems or questions after your procedure. What can I expect after the procedure? After the procedure, it is common to have:  Vomiting.  A sore throat.  Mental slowness. It is common to feel:  Nauseous.  Cold or shivery.  Sleepy.  Tired.  Sore or achy, even in parts of your body where you did not have surgery. Follow these instructions at home: For at least 24 hours after the procedure:   Do not:  Participate in activities where you could fall or become injured.  Drive.  Use heavy machinery.  Drink alcohol.  Take sleeping pills or medicines that cause drowsiness.  Make important decisions or sign legal documents.  Take care of children on your own.  Rest. Eating and drinking   If you vomit, drink water, juice, or soup when you can drink without vomiting.  Drink enough fluid to keep your urine clear or pale yellow.  Make sure you have little or no nausea before eating solid foods.  Follow the diet recommended by your health care provider. General instructions   Have a responsible adult stay with you until you are awake and alert.  Return to your normal activities as told by your health care provider. Ask your health care provider what activities are safe for you.  Take over-the-counter and prescription medicines only as told by your health care provider.  If you smoke, do not smoke without supervision.  Keep all follow-up visits as told by your health care provider. This is important. Contact a health care provider if:  You continue to have nausea or vomiting at home, and medicines are not helpful.  You  cannot drink fluids or start eating again.  You cannot urinate after 8-12 hours.  You develop a skin rash.  You have fever.  You have increasing redness at the site of your procedure. Get help right away if:  You have difficulty breathing.  You have chest pain.  You have unexpected bleeding.  You feel that you are having a life-threatening or urgent problem. This information is not intended to replace advice given to you by your health care provider. Make sure you discuss any questions you have with your health care provider. Document Released: 12/08/2000 Document Revised: 02/04/2016 Document Reviewed: 08/16/2015 Elsevier Interactive Patient Education  2017 [place].  Keep splint dry and intact. Keep hand elevated above heart level. Apply ice to affected area frequently. Take ibuprofen 800 mg TID with meals for 7-10 days, then as necessary. Take pain medication as prescribed or ES Tylenol when needed.  Return for follow-up in 2 days as scheduled.

## 2016-12-03 NOTE — Progress Notes (Signed)
Assisted Estill Batten ANDO with left, ultrasound guided, supraclavicular block. Side rails up, monitors on throughout procedure. See vital signs in flow sheet. Tolerated Procedure well.

## 2016-12-03 NOTE — Anesthesia Postprocedure Evaluation (Signed)
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

## 2016-12-03 NOTE — H&P (Signed)
Paper H&P to be scanned into permanent record. H&P reviewed and patient re-examined. No changes. 

## 2016-12-03 NOTE — Anesthesia Procedure Notes (Signed)
Anesthesia Regional Block: Supraclavicular block   Pre-Anesthetic Checklist: ,, timeout performed, Correct Patient, Correct Site, Correct Laterality, Correct Procedure, Correct Position, site marked, Risks and benefits discussed,  Surgical consent,  Pre-op evaluation,  At surgeon's request and post-op pain management  Laterality: Left  Prep: chloraprep       Needles:  Injection technique: Single-shot  Needle Type: Echogenic Stimulator Needle      Needle Gauge: 21     Additional Needles:   Procedures: ultrasound guided, nerve stimulator,,,,,,   Nerve Stimulator or Paresthesia:  Response: bicep contraction, 0.45 mA,   Additional Responses:   Narrative:  Injection made incrementally with aspirations every 5 mL.  Performed by: Personally  Anesthesiologist: Estill Batten  Additional Notes: Functioning IV was confirmed and monitors applied.  Sterile prep and drape,hand hygiene and sterile gloves were used.Ultrasound guidance: relevant anatomy identified, needle position confirmed, local anesthetic spread visualized around nerve(s)., vascular puncture avoided.  Image printed for medical record.  Negative aspiration and negative test dose prior to incremental administration of local anesthetic. The patient tolerated the procedure well. Vitals signes recorded in RN notes.

## 2016-12-03 NOTE — Op Note (Signed)
12/03/2016  1:57 PM  Patient:   Alexander Carey  Pre-Op Diagnosis:   Dupuytren's contracture, left little finger  Post-Op Diagnosis:   Same.  Procedure:   Release of Dupuytren's contracture, left little finger.  Surgeon:   Pascal Lux, MD  Assistant:   None  Anesthesia:   IV sedation with supraclavicular block by anesthesiologist  Findings:   As above.  Complications:   None  EBL:   0 cc  Fluids:   1000 cc crystalloid  TT:   69 minutes at 250 mmHg  Drains:   None  Closure:   4-0 Prolene interrupted sutures  Brief Clinical Note:   The patient is a 66 year old male with a several year history of progressively worsening contracture of the left little finger. The patient's history and examination are consistent with a Dupuytren's contracture of the left little finger. The patient presents at this time for release of the Dupuytren's contracture of the left little finger.  Procedure:   The patient underwent placement of a supraclavicular block in the preoperative holding area by the anesthesiologist before he was brought into the operating room and lain in the supine position. After adequate IV sedation was achieved, the left hand and upper extremity were prepped with ChloraPrep solution before being draped sterilely. Preoperative antibiotics were administered. After performing a timeout to verify the appropriate surgical site, a Erick Blinks type zigzag incision was made along the volar aspect of the left little finger beginning just proximal to the proximal palmar crease and extending to the PIP flexion crease. The incision was carried down through subcutaneous tissues. The fibrous cord was identified and carefully dissected out from proximal to distal after releasing it proximally. As dissection was carried out, care was taken to identify and protect the common digital nerve and artery on either side of the cord, as well as the underlying flexor tendon, proximally. More distally, the  digital neurovascular bundles were identified and protected. The ulnar digital neurovascular bundle was noted to have been pulled toward the midline of the little finger, requiring very careful dissection. After the mass was removed in its entirety, the adequacy of excision was verified by palpation as well as visually. Several additional fibrous bands were released on the palmar aspect overlying the ring metacarpal. After excision of the Dupuytren's tissue, the little finger MCP and PIP joints could be extended fully.  The wound was copiously irrigated with sterile saline solution before the skin was reapproximated using 4-0 Prolene interrupted sutures. A total of 10 cc of 0.25% plain Sensorcaine was injected in and around the incision to help with postoperative analgesia before a sterile bulky dressing and volar splint extending to the fingertips was applied, maintaining the MCP joints in extension. The patient was then awakened and returned to the recovery room in satisfactory condition after tolerating the procedure well.

## 2016-12-03 NOTE — Anesthesia Procedure Notes (Signed)
Procedure Name: MAC Performed by: Willma Obando Pre-anesthesia Checklist: Patient identified, Emergency Drugs available, Suction available, Timeout performed and Patient being monitored Patient Re-evaluated:Patient Re-evaluated prior to inductionOxygen Delivery Method: Nasal cannula Placement Confirmation: positive ETCO2     

## 2016-12-03 NOTE — Anesthesia Preprocedure Evaluation (Signed)
Anesthesia Evaluation  Patient identified by MRN, date of birth, ID band Patient awake    Reviewed: Allergy & Precautions, NPO status , Patient's Chart, lab work & pertinent test results  Airway Mallampati: II  TM Distance: >3 FB Neck ROM: Full    Dental no notable dental hx.    Pulmonary former smoker,    Pulmonary exam normal        Cardiovascular      Neuro/Psych  Neuromuscular disease    GI/Hepatic negative GI ROS, Neg liver ROS,   Endo/Other  negative endocrine ROS  Renal/GU negative Renal ROS     Musculoskeletal  (+) Arthritis , Osteoarthritis,    Abdominal   Peds  Hematology negative hematology ROS (+)   Anesthesia Other Findings   Reproductive/Obstetrics                             Anesthesia Physical Anesthesia Plan  ASA: II  Anesthesia Plan: Regional and MAC   Post-op Pain Management:    Induction: Intravenous  Airway Management Planned:   Additional Equipment:   Intra-op Plan:   Post-operative Plan:   Informed Consent: I have reviewed the patients History and Physical, chart, labs and discussed the procedure including the risks, benefits and alternatives for the proposed anesthesia with the patient or authorized representative who has indicated his/her understanding and acceptance.     Plan Discussed with: CRNA  Anesthesia Plan Comments:         Anesthesia Quick Evaluation

## 2016-12-03 NOTE — Transfer of Care (Signed)
Immediate Anesthesia Transfer of Care Note  Patient: Alexander Carey  Procedure(s) Performed: Procedure(s): 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 (Left)  Patient Location: PACU  Anesthesia Type: Regional, MAC  Level of Consciousness: awake, alert  and patient cooperative  Airway and Oxygen Therapy: Patient Spontanous Breathing and Patient connected to supplemental oxygen  Post-op Assessment: Post-op Vital signs reviewed, Patient's Cardiovascular Status Stable, Respiratory Function Stable, Patent Airway and No signs of Nausea or vomiting  Post-op Vital Signs: Reviewed and stable  Complications: No apparent anesthesia complications

## 2016-12-05 LAB — SURGICAL PATHOLOGY

## 2016-12-22 ENCOUNTER — Ambulatory Visit: Payer: PPO | Attending: Surgery | Admitting: Occupational Therapy

## 2016-12-22 DIAGNOSIS — L905 Scar conditions and fibrosis of skin: Secondary | ICD-10-CM | POA: Diagnosis not present

## 2016-12-22 DIAGNOSIS — M6281 Muscle weakness (generalized): Secondary | ICD-10-CM | POA: Insufficient documentation

## 2016-12-22 DIAGNOSIS — R600 Localized edema: Secondary | ICD-10-CM | POA: Insufficient documentation

## 2016-12-22 DIAGNOSIS — M79642 Pain in left hand: Secondary | ICD-10-CM | POA: Insufficient documentation

## 2016-12-22 DIAGNOSIS — M25642 Stiffness of left hand, not elsewhere classified: Secondary | ICD-10-CM | POA: Insufficient documentation

## 2016-12-22 NOTE — Patient Instructions (Signed)
Contrast  Scar massage and  cica scar pad for night time   PROM for extention of 5th digit  Tendon glide - each step  Blocked intrinsic  Full fist to 1 finger out of palm 10 reps each  Reinforce exention importance  3 x day  Static custom splint wear at night time

## 2016-12-22 NOTE — Therapy (Signed)
[facility] [address], [place], [address] Phone: [phone]   Fax:  [phone]  Occupational Therapy Treatment  Patient Details  Name: Alexander Carey MRN: [id number] Date of Birth: 03-Jan-1951 Referring Provider: Roland Rack  Encounter Date: 12/22/2016      OT End of Session - 12/22/16 1543    Visit Number 1   Number of Visits 12   Date for OT Re-Evaluation 02/02/17   OT Start Time 0910   OT Stop Time 1002   OT Time Calculation (min) 52 min   Activity Tolerance Patient tolerated treatment well   Behavior During Therapy [facility] for tasks assessed/performed      Past Medical History:  Diagnosis Date  . ED (erectile dysfunction)   . Enlarged prostate   . Hypogonadism in male     Past Surgical History:  Procedure Laterality Date  . COLONOSCOPY  04/2005  . TONSILLECTOMY    . TRIGGER FINGER RELEASE Left 12/03/2016   Procedure: 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;  Surgeon: Corky Mull, MD;  Location: [place];  Service: Orthopedics;  Laterality: Left;    There were no vitals filed for this visit.      Subjective Assessment - 12/22/16 0917    Subjective  My pinkie was bend nearly 90 degrees at big knuckle - so much better - but scar feels tight , very thick and tight and tender  - surgery was 3/21   Patient Stated Goals Want to use my hand - play golf, lift objects - still work  as Chief Strategy Officer - gardening, fishing , driving truck   Currently in Pain? Yes   Pain Score 2    Pain Location Hand   Pain Orientation Left   Pain Descriptors / Indicators Sore   Pain Type Surgical pain            [facility] OT Assessment - 12/22/16 0001      Assessment   Diagnosis L 5th digit dupuytrens release    Referring Provider Poggi   Onset Date 12/03/16     Precautions   Required Braces or Orthoses --  Needs new custom splint - static     Home  Environment   Lives  With Spouse     Prior Function   Vocation --  contractor   Leisure WOrk still as Chief Strategy Officer, R hand dominant-  play golf, drive truck , fish, and work in garden      Edema   Edema proximal phalanges 5th L 7.8 cm and R 6.8 cm      Right Hand AROM   R Little  MCP 0-90 90 Degrees   R Little PIP 0-100 95 Degrees   R Little DIP 0-70 65 Degrees     Left Hand AROM   L Little  MCP 0-90 80 Degrees  -25 with hyper extention of PIP during extention of 5th   L Little PIP 0-100 50 Degrees   L Little DIP 0-70 45 Degrees         Contrast  Scar massage and  cica scar pad for night time   PROM for extention of 5th digit  Tendon glide - each step  Blocked intrinsic  Full fist to 1 finger out of palm 10 reps each  Reinforce exention importance  3 x day  Static custom splint wear at night time  Fabricate hand base dorsal splint -included 5th and 4th -  increase extention at -20 at [facility] extention - sleep with                    OT Education - 12/22/16 1542    Education provided Yes   Education Details HEP and findings of eval    Person(s) Educated Patient;Spouse   Methods Explanation;Demonstration;Tactile cues;Verbal cues;Handout   Comprehension Verbal cues required;Returned demonstration;Verbalized understanding          OT Short Term Goals - 12/22/16 1552      OT SHORT TERM GOAL #1   Title Pt ind in wearing of custom splint and show improve MC extention at L 5th MC    Baseline fabricate  this date dorsal hand base splint for 4th and 5th extention    Time 3   Period Weeks   Status New     OT SHORT TERM GOAL #2   Title L 5th digit flexion improve at all joints to [facility] to touch palm to hold on to 1 cm objects   Baseline MC 80 , PIP 50 , DIP 45 L 5th digit- extention MC - 25   Time 4   Period Weeks   Status New           OT Long Term Goals - 12/22/16 1602      OT LONG TERM GOAL #1   Title L hand grip strength improve to at least 50% compare to R hand to  grip on to tools , golf club   Baseline NT yet   Time 6   Period Weeks   Status New     OT LONG TERM GOAL #2   Title Scar improve for pt to tolerate different textures , clapping hands and using tools    Baseline scar still with 3 scabs and with increase edema and thickness on thenar eminence - pain 2/10   Time 3   Period Weeks   Status New     OT LONG TERM GOAL #3   Title Function on PRWHE improve by at least 7 points and pain by 10 points    Baseline Pain on PRHWE 17/50 and function 9/50   Time 6   Period Weeks   Status New               Plan - 12/22/16 1543    Clinical Impression Statement Pt present 2 and 1/2 wks s/p dupuytrens release of L 5th digit - pt with scar healing very well - 3 areas of scabs - pt show some increase edema , decrease ROM in flexion in all digits at 5th and decreae MC extention -fabricated dorsal hand base  extention splint -included 4th and 5th digits with 2 straps on digits - one at proximal phalanges to decrease hyper extention of PIP - pt and wife ed on HEP and splint wearing    Rehab Potential Good   OT Frequency 2x / week   OT Duration 6 weeks   OT Treatment/Interventions Self-care/ADL training;Fluidtherapy;Splinting;Patient/family education;Therapeutic exercises;Contrast Bath;Ultrasound;Scar mobilization;Passive range of motion;Manual Therapy;Parrafin   Plan assess tolerance for splint - if needed change - update HEP as needed    Consulted and Agree with Plan of Care Patient      Patient will benefit from skilled therapeutic intervention in order to improve the following deficits and impairments:  Decreased coordination, Decreased range of motion, Impaired flexibility, Increased edema, Decreased skin integrity, Pain, Impaired UE functional use, Decreased scar mobility, Decreased strength  Visit Diagnosis: Pain in  left hand - Plan: Ot plan of care cert/re-cert  Stiffness of left hand, not elsewhere classified - Plan: Ot plan of care  cert/re-cert  Localized edema - Plan: Ot plan of care cert/re-cert  Muscle weakness (generalized) - Plan: Ot plan of care cert/re-cert  Scar condition and fibrosis of skin - Plan: Ot plan of care cert/re-cert    Problem List Patient Active Problem List   Diagnosis Date Noted  . 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/01/2015  . Dyslipidemia 08/01/2015  . Acid indigestion 08/01/2015  . Fecal occult blood test positive 08/01/2015  . Allergic rhinitis, seasonal 08/01/2015  . Degeneration of intervertebral disc of lumbar region 02/01/2014  . Neuritis or radiculitis due to rupture of lumbar intervertebral disc 02/01/2014  . Lumbar canal stenosis 02/01/2014  . Degenerative arthritis of lumbar spine 02/01/2014    Rosalyn Gess OTR/L,CLT  12/22/2016, 4:09 PM  [facility] [address], [place], [address] Phone: [phone]   Fax:  [phone]  Name: Alexander Carey MRN: [id number] Date of Birth: February 21, 1951

## 2016-12-26 ENCOUNTER — Ambulatory Visit: Payer: PPO | Admitting: Occupational Therapy

## 2016-12-26 DIAGNOSIS — M79642 Pain in left hand: Secondary | ICD-10-CM

## 2016-12-26 DIAGNOSIS — L905 Scar conditions and fibrosis of skin: Secondary | ICD-10-CM

## 2016-12-26 DIAGNOSIS — R600 Localized edema: Secondary | ICD-10-CM

## 2016-12-26 DIAGNOSIS — M25642 Stiffness of left hand, not elsewhere classified: Secondary | ICD-10-CM

## 2016-12-26 DIAGNOSIS — M6281 Muscle weakness (generalized): Secondary | ICD-10-CM

## 2016-12-26 NOTE — Patient Instructions (Addendum)
Same hep  Add  isotoner glove for night time fitted to decrease edema in hand  Silicon digi sleeve fitted on 4th digit to decrease digit edema  for during day and  cica scar pad for night time under glove

## 2016-12-26 NOTE — Therapy (Signed)
[place] [address], [place], [address] Phone: [phone]   Fax:  [phone]  Occupational Therapy Treatment  Patient Details  Name: Alexander Carey MRN: [id number] Date of Birth: [birth year] Referring Provider: Roland Rack  Encounter Date: 12/26/2016      OT End of Session - 12/26/16 0939    Visit Number 2   Number of Visits 12   Date for OT Re-Evaluation 02/02/17   OT Start Time 0844   OT Stop Time 0929   OT Time Calculation (min) 45 min   Activity Tolerance Patient tolerated treatment well   Behavior During Therapy [facility] for tasks assessed/performed      Past Medical History:  Diagnosis Date  . ED (erectile dysfunction)   . Enlarged prostate   . Hypogonadism in male     Past Surgical History:  Procedure Laterality Date  . COLONOSCOPY  04/2005  . TONSILLECTOMY    . TRIGGER FINGER RELEASE Left 12/03/2016   Procedure: 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;  Surgeon: Corky Mull, MD;  Location: [place];  Service: Orthopedics;  Laterality: Left;    There were no vitals filed for this visit.      Subjective Assessment - 12/26/16 0845    Subjective  Motion better - just tender still -and the splint was bothering me last night - had to take if off - was throbbing - but other nights were fine - scar improving    Patient Stated Goals Want to use my hand - play golf, lift objects - still work  as Chief Strategy Officer - gardening, fishing , driving truck   Currently in Pain? Yes   Pain Score 1    Pain Location Hand   Pain Orientation Left   Pain Descriptors / Indicators Tender   Pain Type Surgical pain            [facility] OT Assessment - 12/26/16 0001      Left Hand AROM   L Little  MCP 0-90 85 Degrees   L Little PIP 0-100 80 Degrees                  OT Treatments/Exercises (OP) - 12/26/16 0001      Ultrasound   Ultrasound Location palmar scar    Ultrasound Parameters 3.3MHZ at 20% , 1.0 intensity , for 5 min to decrease pain and edema   Ultrasound Goals Edema;Pain     LUE Fluidotherapy   Number Minutes Fluidotherapy 10 Minutes   LUE Fluidotherapy Location Hand   Comments at [facility] to increase ROM and decrease scar tissue       Measure ROM at [facility] and PIP of L 5th  See flow sheet   fluido done this date - to increase ROM   Scar mobs and massage done over palmar scar - focus on thick areas , as well as fibrosis around scar and on thenar eminence  Done vibration  On parts of scar  isotoner glove for night time fitted to decrease edema in hand  Silicon digi sleeve fitted on 4th digit to decrease digit edema  for during day and  cica scar pad for night time under glove  PROM for extention at [facility] of 5th digit  Tendon glide - each step  Blocked intrinsic  Full fist touching palm - limited by edema in PIP and MC  10 reps each  Reinforce exention importance  - do  compensate with increase PIP ext for lag at [facility]  3 x day  Static custom splint to cont with for extention of 4th and 5th -  night time             OT Education - 12/26/16 0939    Education provided Yes   Education Details glove and digi sleeve wearing - review scar massage   Person(s) Educated Patient   Methods Explanation;Demonstration;Tactile cues;Verbal cues   Comprehension Verbal cues required;Returned demonstration;Verbalized understanding          OT Short Term Goals - 12/22/16 1552      OT SHORT TERM GOAL #1   Title Pt ind in wearing of custom splint and show improve MC extention at L 5th MC    Baseline fabricate  this date dorsal hand base splint for 4th and 5th extention    Time 3   Period Weeks   Status New     OT SHORT TERM GOAL #2   Title L 5th digit flexion improve at all joints to [facility] to touch palm to hold on to 1 cm objects   Baseline MC 80 , PIP 50 , DIP 45 L 5th digit- extention MC - 25   Time 4   Period Weeks   Status New            OT Long Term Goals - 12/22/16 1602      OT LONG TERM GOAL #1   Title L hand grip strength improve to at least 50% compare to R hand to grip on to tools , golf club   Baseline NT yet   Time 6   Period Weeks   Status New     OT LONG TERM GOAL #2   Title Scar improve for pt to tolerate different textures , clapping hands and using tools    Baseline scar still with 3 scabs and with increase edema and thickness on thenar eminence - pain 2/10   Time 3   Period Weeks   Status New     OT LONG TERM GOAL #3   Title Function on PRWHE improve by at least 7 points and pain by 10 points    Baseline Pain on PRHWE 17/50 and function 9/50   Time 6   Period Weeks   Status New               Plan - 12/26/16 0939    Clinical Impression Statement Pt this date walking in with flexion - able to touch palm but limited by edema in thenar eminence and 5th digit - showed increase exention - but compensate with hyper extention of PIP for lag at [facility] extnetion - still 2 scabs present but closed - provided compression glove and digi sleeve for 5th - to decrease edema - cont to decrease scar tissue , increase ROM and decrease edema    Rehab Potential Good   OT Frequency 2x / week   OT Duration 6 weeks   OT Treatment/Interventions Self-care/ADL training;Fluidtherapy;Splinting;Patient/family education;Therapeutic exercises;Contrast Bath;Ultrasound;Scar mobilization;Passive range of motion;Manual Therapy;Parrafin   Plan assess fit of splint and scar mobs and assess ROM    OT Home Exercise Plan see pt instruction    Consulted and Agree with Plan of Care Patient      Patient will benefit from skilled therapeutic intervention in order to improve the following deficits and impairments:  Decreased coordination, Decreased range of motion, Impaired flexibility, Increased edema, Decreased skin integrity, Pain, Impaired UE functional  use, Decreased scar mobility, Decreased strength  Visit  Diagnosis: Pain in left hand  Stiffness of left hand, not elsewhere classified  Muscle weakness (generalized)  Scar condition and fibrosis of skin  Localized edema    Problem List Patient Active Problem List   Diagnosis Date Noted  . 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/01/2015  . Dyslipidemia 08/01/2015  . Acid indigestion 08/01/2015  . Fecal occult blood test positive 08/01/2015  . Allergic rhinitis, seasonal 08/01/2015  . Degeneration of intervertebral disc of lumbar region 02/01/2014  . Neuritis or radiculitis due to rupture of lumbar intervertebral disc 02/01/2014  . Lumbar canal stenosis 02/01/2014  . Degenerative arthritis of lumbar spine 02/01/2014    Rosalyn Gess OTR/L,CLT 12/26/2016, 12:29 PM  [place] [facility] [address] [place], [address] Phone: [phone]   Fax:  [phone]  Name: Alexander Carey MRN: [id number] Date of Birth: [birth year]

## 2017-01-01 ENCOUNTER — Ambulatory Visit: Payer: PPO | Admitting: Occupational Therapy

## 2017-01-01 DIAGNOSIS — R600 Localized edema: Secondary | ICD-10-CM

## 2017-01-01 DIAGNOSIS — L905 Scar conditions and fibrosis of skin: Secondary | ICD-10-CM

## 2017-01-01 DIAGNOSIS — M79642 Pain in left hand: Secondary | ICD-10-CM

## 2017-01-01 DIAGNOSIS — M25642 Stiffness of left hand, not elsewhere classified: Secondary | ICD-10-CM

## 2017-01-01 DIAGNOSIS — M6281 Muscle weakness (generalized): Secondary | ICD-10-CM

## 2017-01-01 NOTE — Patient Instructions (Addendum)
stretch for 5th MC extention - gentle traction  Ed wife to do at home   Silicon digi sleeve fitted on 5th digit to decrease digit edema  for during day and  and one for night time too - cut larger cica scar pad for palmar scar to use at night  Time   Same ROM HEP  add prayer stretch for composite extention stretch - 8 x 5 sec    3 x day  To to ice several times during day for edema and tenderness Modify Static custom splint  - cut splint down to allow PIP flexion and DIP flexion - keeping 4th and 5th MC's into extention  - to use at  night time

## 2017-01-01 NOTE — Therapy (Signed)
[place] [facility] [address], [place], [address] Phone: [phone]   Fax:  [phone]  Occupational Therapy Treatment  Patient Details  Name: Alexander Carey MRN: [id number] Date of Birth: 10-30-1950 Referring Provider: Roland Rack  Encounter Date: 01/01/2017      OT End of Session - 01/01/17 0936    Visit Number 3   Number of Visits 12   Date for OT Re-Evaluation 02/02/17   OT Start Time 0841   OT Stop Time 0928   OT Time Calculation (min) 47 min   Activity Tolerance Patient tolerated treatment well   Behavior During Therapy [facility] for tasks assessed/performed      Past Medical History:  Diagnosis Date  . ED (erectile dysfunction)   . Enlarged prostate   . Hypogonadism in male     Past Surgical History:  Procedure Laterality Date  . COLONOSCOPY  04/2005  . TONSILLECTOMY    . 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      Subjective Assessment - 01/01/17 0931    Subjective  DOing okay - tender at the end of day - and still feels swollen and thick - cannot tolerate the splint at night time - starts hurting along the side of finger and hand   Patient Stated Goals Want to use my hand - play golf, lift objects - still work  as Chief Strategy Officer - gardening, fishing , driving truck   Currently in Pain? No/denies                      OT Treatments/Exercises (OP) - 01/01/17 0001      LUE Paraffin   Number Minutes Paraffin 10 Minutes   LUE Paraffin Location Hand   Comments at [facility] to decrease scar tissue and increase ROM     Paraffin to L hand see flowsheet  Scar mobs and massage done over palmar scar - focus on thick areas , as well as fibrosis around scar and on thenar  eminence  Done vibration  and soft tissue by OT - with extention stretch for 5th MC extention - gentle traction  Ed wife to do at home   Silicon digi sleeve fitted on 5th digit to decrease digit edema  for during day and  and one for night time too - cut larger cica scar pad for palmar scar to use at night  Time   PROM for extention at [facility] of 5th digit  Tendon glide - each step  Blocked intrinsic  Full fist touching palm - limited by edema in PIP  10 reps each  Done and add prayer stretch for composite extention stretch - 8 x 5 sec  Reinforce exention importance  - do compensate with increase PIP ext for lag at [facility]  3 x day  To to ice several times during day for edema and tenderness Modify Static custom splint  - cut splint down to allow PIP flexion and DIP flexion - keeping 4th and 5th MC's into extention  - to use at  night time  Ice at the end of session to decrease tenderness and edema              OT Education - 01/01/17  0936    Education provided Yes   Education Details update HEP and  modify splint    Person(s) Educated Patient   Methods Explanation;Demonstration;Tactile cues;Verbal cues   Comprehension Verbal cues required;Returned demonstration;Verbalized understanding          OT Short Term Goals - 12/22/16 1552      OT SHORT TERM GOAL #1   Title Pt ind in wearing of custom splint and show improve MC extention at L 5th MC    Baseline fabricate  this date dorsal hand base splint for 4th and 5th extention    Time 3   Period Weeks   Status New     OT SHORT TERM GOAL #2   Title L 5th digit flexion improve at all joints to [facility] to touch palm to hold on to 1 cm objects   Baseline MC 80 , PIP 50 , DIP 45 L 5th digit- extention MC - 25   Time 4   Period Weeks   Status New           OT Long Term Goals - 12/22/16 1602      OT LONG TERM GOAL #1   Title L hand grip strength improve to at least 50% compare to R hand to grip on to tools , golf club    Baseline NT yet   Time 6   Period Weeks   Status New     OT LONG TERM GOAL #2   Title Scar improve for pt to tolerate different textures , clapping hands and using tools    Baseline scar still with 3 scabs and with increase edema and thickness on thenar eminence - pain 2/10   Time 3   Period Weeks   Status New     OT LONG TERM GOAL #3   Title Function on PRWHE improve by at least 7 points and pain by 10 points    Baseline Pain on PRHWE 17/50 and function 9/50   Time 6   Period Weeks   Status New               Plan - 01/01/17 3474    Clinical Impression Statement Pt show increase scar healing - still edema in hand and PIP - thick scar tissue at areas where it took longer to heal - pt to wear scar pad on palmar scar now and silicon dige sleeve night time too - focus on scar massage - modify splint to tolerate better  - and ice for edema /tenderness    Rehab Potential Good   OT Frequency 1x / week   OT Duration 4 weeks   OT Treatment/Interventions Self-care/ADL training;Fluidtherapy;Splinting;Patient/family education;Therapeutic exercises;Contrast Bath;Ultrasound;Scar mobilization;Passive range of motion;Manual Therapy;Parrafin   Plan asses splint - and scar tissue of focus - [place] at end if needed    OT Home Exercise Plan see pt instruction    Consulted and Agree with Plan of Care Patient      Patient will benefit from skilled therapeutic intervention in order to improve the following deficits and impairments:  Decreased coordination, Decreased range of motion, Impaired flexibility, Increased edema, Decreased skin integrity, Pain, Impaired UE functional use, Decreased scar mobility, Decreased strength  Visit Diagnosis: Pain in left hand  Stiffness of left hand, not elsewhere classified  Muscle weakness (generalized)  Scar condition and fibrosis of skin  Localized edema    Problem List Patient Active Problem List   Diagnosis Date Noted  . Benign fibroma of prostate  08/01/2015  . Dyslipidemia 08/01/2015  . Acid indigestion 08/01/2015  . Fecal occult blood test positive 08/01/2015  . Allergic rhinitis, seasonal 08/01/2015  . Degeneration of intervertebral disc of lumbar region 02/01/2014  . Neuritis or radiculitis due to rupture of lumbar intervertebral disc 02/01/2014  . Lumbar canal stenosis 02/01/2014  . Degenerative arthritis of lumbar spine 02/01/2014    Rosalyn Gess OTR/L,CLT 01/01/2017, 9:41 AM  [place] [facility] [address] [place], [address] Phone: [phone]   Fax:  [phone]  Name: CLEMONS SALVUCCI MRN: [id number] Date of Birth: 1951-08-30

## 2017-01-07 ENCOUNTER — Ambulatory Visit: Payer: PPO | Admitting: Occupational Therapy

## 2017-01-07 DIAGNOSIS — R600 Localized edema: Secondary | ICD-10-CM

## 2017-01-07 DIAGNOSIS — M79642 Pain in left hand: Secondary | ICD-10-CM | POA: Diagnosis not present

## 2017-01-07 DIAGNOSIS — L905 Scar conditions and fibrosis of skin: Secondary | ICD-10-CM

## 2017-01-07 DIAGNOSIS — M25642 Stiffness of left hand, not elsewhere classified: Secondary | ICD-10-CM

## 2017-01-07 DIAGNOSIS — M6281 Muscle weakness (generalized): Secondary | ICD-10-CM

## 2017-01-07 NOTE — Patient Instructions (Signed)
Extention with intrinsic fist to increase MC extention and not compensate with PIP extention  digi sleeve with Cica scar pad for night time  And digi sleeve during day  Same for scar massage and ROM

## 2017-01-07 NOTE — Therapy (Signed)
[place] [facility] [address], [place], [address] Phone: [phone]   Fax:  [phone]  Occupational Therapy Treatment  Patient Details  Name: Alexander Carey MRN: [id number] Date of Birth: March 18, 1951 Referring Provider: Roland Rack  Encounter Date: 01/07/2017      OT End of Session - 01/07/17 1442    Visit Number 4   Number of Visits 12   Date for OT Re-Evaluation 02/02/17   OT Start Time 0930   OT Stop Time 1020   OT Time Calculation (min) 50 min   Activity Tolerance Patient tolerated treatment well   Behavior During Therapy [facility] for tasks assessed/performed      Past Medical History:  Diagnosis Date  . ED (erectile dysfunction)   . Enlarged prostate   . Hypogonadism in male     Past Surgical History:  Procedure Laterality Date  . COLONOSCOPY  04/2005  . TONSILLECTOMY    . 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      Subjective Assessment - 01/07/17 0931    Subjective  Tenderness getting better - felt at the base of my pinkie like paper cut - scar was little dry - felt like I got stitch out at that spot over weekend- still feeling thick in palm and base of pinkie    Patient Stated Goals Want to use my hand - play golf, lift objects - still work  as Chief Strategy Officer - gardening, fishing , driving truck   Currently in Pain? No/denies            [facility] OT Assessment - 01/07/17 0001      Strength   Right Hand Grip (lbs) 95   Left Hand Grip (lbs) 78     Left Hand AROM   L Little  MCP 0-90 90 Degrees   L Little PIP 0-100 85 Degrees                  OT Treatments/Exercises (OP) - 01/07/17 0001      LUE Paraffin   Number Minutes Paraffin 10 Minutes   LUE  Paraffin Location Hand   Comments at [facility] to increase MC extention of 5th and decrease scar tissue       Paraffin to L hand see flowsheet  Scar mobs and massage done over palmar scar - focus on thick areas , as well as fibrosis around scar and on thenar eminence  Done vibration and soft tissue by OT - with extention stretch for 5th MC extention - gentle traction  Ed wife to do at home   Cisco digi sleeves fitted on 5th digit to decrease digit edema for during day and  one for night time too - larger cica scar pad for palmar scar to use at night  make sure proximal 5th covered   PROM for extention at [facility] of 5th digit  Tendon glide - each step  Blocked intrinsic  Full fist touching palm - limited by edema in  Base of 5th   10 reps each  Done and cont prayer stretch for composite extention stretch - 8 x 5 sec  Reinforce exention importance - do compensate with increase PIP ext for lag at [facility]  3 x day  To to ice several times during day for edema and tenderness            OT Education - 01/07/17 1442    Education provided Yes   Education Details HEP updated    Person(s) Educated Patient;Spouse   Methods Explanation;Demonstration;Tactile cues;Verbal cues   Comprehension Verbal cues required;Returned demonstration;Verbalized understanding          OT Short Term Goals - 12/22/16 1552      OT SHORT TERM GOAL #1   Title Pt ind in wearing of custom splint and show improve MC extention at L 5th MC    Baseline fabricate  this date dorsal hand base splint for 4th and 5th extention    Time 3   Period Weeks   Status New     OT SHORT TERM GOAL #2   Title L 5th digit flexion improve at all joints to [facility] to touch palm to hold on to 1 cm objects   Baseline MC 80 , PIP 50 , DIP 45 L 5th digit- extention MC - 25   Time 4   Period Weeks   Status New           OT Long Term Goals - 12/22/16 1602      OT LONG TERM GOAL #1   Title L hand grip strength improve to at  least 50% compare to R hand to grip on to tools , golf club   Baseline NT yet   Time 6   Period Weeks   Status New     OT LONG TERM GOAL #2   Title Scar improve for pt to tolerate different textures , clapping hands and using tools    Baseline scar still with 3 scabs and with increase edema and thickness on thenar eminence - pain 2/10   Time 3   Period Weeks   Status New     OT LONG TERM GOAL #3   Title Function on PRWHE improve by at least 7 points and pain by 10 points    Baseline Pain on PRHWE 17/50 and function 9/50   Time 6   Period Weeks   Status New               Plan - 01/07/17 1442    Clinical Impression Statement Pt making progress in scar tissue, edema over hypothenar eminence, and flexion- MC extention was 5 dgrees worse this date - but pt to focus on scar massage and mobs to base of 5th , volar 5th and palm    Rehab Potential Good   OT Frequency 1x / week   OT Duration 4 weeks   OT Treatment/Interventions Self-care/ADL training;Fluidtherapy;Splinting;Patient/family education;Therapeutic exercises;Contrast Bath;Ultrasound;Scar mobilization;Passive range of motion;Manual Therapy;Parrafin   Plan scar tissue massage, extention of MC    OT Home Exercise Plan see pt instruction    Consulted and Agree with Plan of Care Patient      Patient will benefit from skilled therapeutic intervention in order to improve the following deficits and impairments:  Decreased coordination, Decreased range of motion, Impaired flexibility, Increased edema, Decreased skin integrity, Pain, Impaired UE functional use, Decreased scar mobility, Decreased strength  Visit Diagnosis: Pain in left hand  Stiffness of left hand, not elsewhere classified  Muscle weakness (generalized)  Scar condition and fibrosis of skin  Localized edema    Problem List Patient Active Problem List   Diagnosis Date Noted  . 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/01/2015  . Dyslipidemia 08/01/2015  .  Acid  indigestion 08/01/2015  . Fecal occult blood test positive 08/01/2015  . Allergic rhinitis, seasonal 08/01/2015  . Degeneration of intervertebral disc of lumbar region 02/01/2014  . Neuritis or radiculitis due to rupture of lumbar intervertebral disc 02/01/2014  . Lumbar canal stenosis 02/01/2014  . Degenerative arthritis of lumbar spine 02/01/2014    Rosalyn Gess  OTR/L,CLT 01/07/2017, 2:47 PM  [place] [facility] [address] [place], [address] Phone: [phone]   Fax:  [phone]  Name: Alexander Carey MRN: [id number] Date of Birth: [birth year]

## 2017-01-09 ENCOUNTER — Ambulatory Visit: Payer: PPO | Admitting: Occupational Therapy

## 2017-01-15 ENCOUNTER — Ambulatory Visit: Payer: PPO | Attending: Surgery | Admitting: Occupational Therapy

## 2017-01-15 DIAGNOSIS — M6281 Muscle weakness (generalized): Secondary | ICD-10-CM

## 2017-01-15 DIAGNOSIS — L905 Scar conditions and fibrosis of skin: Secondary | ICD-10-CM | POA: Diagnosis not present

## 2017-01-15 DIAGNOSIS — M25642 Stiffness of left hand, not elsewhere classified: Secondary | ICD-10-CM | POA: Diagnosis not present

## 2017-01-15 DIAGNOSIS — R600 Localized edema: Secondary | ICD-10-CM | POA: Diagnosis not present

## 2017-01-15 DIAGNOSIS — M79642 Pain in left hand: Secondary | ICD-10-CM

## 2017-01-15 NOTE — Therapy (Signed)
[place] [facility] [address] [place], [address] Phone: [phone]   Fax:  [phone]  Occupational Therapy Treatment  Patient Details  Name: Alexander Carey MRN: [id number] Date of Birth: 11/13/1950 Referring Provider: Roland Rack  Encounter Date: 01/15/2017      OT End of Session - 01/15/17 1921    Visit Number 5   Number of Visits 12   Date for OT Re-Evaluation 02/02/17   OT Start Time 0855   OT Stop Time 0935   OT Time Calculation (min) 40 min   Activity Tolerance Patient tolerated treatment well   Behavior During Therapy [facility] for tasks assessed/performed      Past Medical History:  Diagnosis Date  . ED (erectile dysfunction)   . Enlarged prostate   . Hypogonadism in male     Past Surgical History:  Procedure Laterality Date  . COLONOSCOPY  04/2005  . TONSILLECTOMY    . 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      Subjective Assessment - 01/15/17 1910    Subjective  Seen Dr and released me - doing okay - using it more - still feels thick in the base of pinkie when trying to make tight fist- and at one part of scar    Patient Stated Goals Want to use my hand - play golf, lift objects - still work  as Chief Strategy Officer - gardening, fishing , driving truck   Currently in Pain? No/denies            [facility] OT Assessment - 01/15/17 0001      Strength   Right Hand Grip (lbs) 95   Left Hand Grip (lbs) 82                  OT Treatments/Exercises (OP) - 01/15/17 0001      Ultrasound   Ultrasound Location volar wrist and 5th digit   Ultrasound Parameters 3.3MHZ, 1.0 intensity , 20 %  for 4 min    Ultrasound Goals Edema;Pain     LUE Paraffin   Number Minutes  Paraffin 10 Minutes   LUE Paraffin Location Hand   Comments at [facility] to decrease scar tissue and increase MC extenttion at [facility]       Assess grip and ROM in R hand - see flowsheet  Assess scar tissue and edema  - still increase by .5 cm at proximal phalanges   Paraffin to L hand   Scar mobs and massage done over palmar scar - focus on thick areas , as well as fibrosis around scar and on thenar eminence  Done vibration and soft tissue by OT - with extention stretch for 5th MC extention - gentle traction  Pt report did not do this as much - the stretch  Did to some prayer stretch  Provide new  Silicon digi sleeves fitted on 5th digit to decrease digit edema for during day and  night time- larger cica scar pad for palmar scar to use at night make sure proximal 5th covered   If do not have time - focus on PROM MC extention , prayer stretch  Scar mobs and scar pad or digisleeve  Reinforce exention importance - do compensate with increase PIP  ext for lag at [facility]  3 x day              OT Education - 01/15/17 1920    Education provided Yes   Education Details HEP reinforce scar mobs and compression and MC extention    Person(s) Educated Patient   Methods Explanation;Demonstration;Tactile cues;Verbal cues;Handout   Comprehension Verbal cues required;Returned demonstration;Verbalized understanding          OT Short Term Goals - 01/15/17 1924      OT SHORT TERM GOAL #1   Title Pt ind in wearing of custom splint and show improve MC extention at L 5th MC    Baseline unable - even with modifications - increase shooting pain at night time    Status Deferred     OT SHORT TERM GOAL #2   Title L 5th digit flexion improve at all joints to [facility] to touch palm to hold on to 1 cm objects   Status Achieved           OT Long Term Goals - 01/15/17 1925      OT LONG TERM GOAL #1   Title L hand grip strength improve to at least 50% compare to R hand to grip on to tools , golf  club   Status Achieved     OT LONG TERM GOAL #2   Title Scar improve for pt to tolerate different textures , clapping hands and using tools    Status Achieved     OT LONG TERM GOAL #3   Title Function on PRWHE improve by at least 7 points and pain by 10 points    Baseline Pain on PRHWE 17/50 and function 9/50at eval - will reassess in 3 wks    Time 3   Period Weeks   Status On-going               Plan - 01/15/17 1921    Clinical Impression Statement Pt made great progress in  scar healing , ROM and grip strength - but scar still has one area of thickness and base of 5th at proximal phalanges is thick and swollen still - pt MC extention was worse than last time - to do PROM for [facility] extention , scar mobs and scar pad /compression sleeve - will reassess in 3 wks    Rehab Potential Good   OT Frequency Biweekly   OT Duration 4 weeks   OT Treatment/Interventions Self-care/ADL training;Fluidtherapy;Splinting;Patient/family education;Therapeutic exercises;Contrast Bath;Ultrasound;Scar mobilization;Passive range of motion;Manual Therapy;Parrafin   Plan reassess extention of MC , scar tissue and edema    OT Home Exercise Plan see pt instruction    Consulted and Agree with Plan of Care Patient      Patient will benefit from skilled therapeutic intervention in order to improve the following deficits and impairments:  Decreased coordination, Decreased range of motion, Impaired flexibility, Increased edema, Decreased skin integrity, Pain, Impaired UE functional use, Decreased scar mobility, Decreased strength  Visit Diagnosis: Pain in left hand  Stiffness of left hand, not elsewhere classified  Muscle weakness (generalized)  Scar condition and fibrosis of skin  Localized edema    Problem List Patient Active Problem List   Diagnosis Date Noted  . 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/01/2015  . Dyslipidemia 08/01/2015  . Acid indigestion 08/01/2015  . Fecal occult blood test positive  08/01/2015  . Allergic rhinitis, seasonal 08/01/2015  . Degeneration of intervertebral disc of lumbar region 02/01/2014  . Neuritis or radiculitis due to  rupture of lumbar intervertebral disc 02/01/2014  . Lumbar canal stenosis 02/01/2014  . Degenerative arthritis of lumbar spine 02/01/2014    Rosalyn Gess OTR/L,CLT 01/15/2017, 7:26 PM  [place] [facility] [address] [place], [address] Phone: [phone]   Fax:  [phone]  Name: HERMON ZEA MRN: [id number] Date of Birth: 08-11-1951

## 2017-01-15 NOTE — Patient Instructions (Signed)
Provide new  Silicon digi sleeves fitted on 5th digit to decrease digit edema for during day and  night time- larger cica scar pad for palmar scar to use at night make sure proximal 5th covered   If do not have time - focus on PROM MC extention , prayer stretch  Scar mobs and scar pad or digisleeve  Reinforce exention importance - do compensate with increase PIP ext for lag at [facility]  3 x day

## 2017-02-05 ENCOUNTER — Ambulatory Visit: Payer: PPO | Admitting: Occupational Therapy

## 2017-02-05 DIAGNOSIS — R600 Localized edema: Secondary | ICD-10-CM

## 2017-02-05 DIAGNOSIS — M6281 Muscle weakness (generalized): Secondary | ICD-10-CM

## 2017-02-05 DIAGNOSIS — M25642 Stiffness of left hand, not elsewhere classified: Secondary | ICD-10-CM

## 2017-02-05 DIAGNOSIS — L905 Scar conditions and fibrosis of skin: Secondary | ICD-10-CM

## 2017-02-05 DIAGNOSIS — M79642 Pain in left hand: Secondary | ICD-10-CM

## 2017-02-05 NOTE — Patient Instructions (Signed)
  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

## 2017-02-05 NOTE — Therapy (Signed)
[facility] [address] [place], [address] Phone: [phone]   Fax:  [phone]  Occupational Therapy Treatment/discharge  Patient Details  Name: Alexander Carey MRN: [id number] Date of Birth: 11-25-1950 Referring Provider: Roland Rack  Encounter Date: 02/05/2017      OT End of Session - 02/05/17 1059    Visit Number 6   Number of Visits 6   Date for OT Re-Evaluation 02/05/17   OT Start Time 0850   OT Stop Time 0929   OT Time Calculation (min) 39 min   Activity Tolerance Patient tolerated treatment well   Behavior During Therapy [facility] for tasks assessed/performed      Past Medical History:  Diagnosis Date  . ED (erectile dysfunction)   . Enlarged prostate   . Hypogonadism in male     Past Surgical History:  Procedure Laterality Date  . COLONOSCOPY  04/2005  . TONSILLECTOMY    . 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      Subjective Assessment - 02/05/17 1053    Subjective  Doing okay - stopped probably about 2 wks ago scar pads - still do some massage - hand doing okay - use it in everything - no pain - but my side of forearm and pinkie/ring finger goes numb sometimes when driving    Patient Stated Goals Want to use my hand - play golf, lift objects - still work  as Chief Strategy Officer - gardening, fishing , driving truck   Currently in Pain? No/denies            [facility] OT Assessment - 02/05/17 0001      Strength   Right Hand Grip (lbs) 95   Left Hand Grip (lbs) 85     Left Hand AROM   L Little  MCP 0-90 100 Degrees  -20   L Little PIP 0-100 90 Degrees              Assess AROM in digits , grip and prehension strength - see flowsheet  assess scar still  thick on distal palmar scar and volar MC of 5th   Pt negative for Cubital tunnel pain or tenderness  Or Tinel  Pt ed pt and wife about Cubital tunnel - and avoid propping it up on armrest or sleeping with it flex more than 90 degrees Pt report he do sleep on L arm because of R shoulder pain , and 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   CUS at 3.3MHZ done .8 intensity - for 5 min over volar scar  Followed by vibration for scar mobs  Pt and wife end on continues scar mobs , cica  scar pad for another month/digisleeve for 5th  - pt now 9 wks S/P surgery  Issued new ones And MC still extention lag  PROM for [facility] extention done with palm on table  Pt to do at home 2-3 x day               OT Education - 02/05/17 1059    Education provided Yes   Education Details HEP review to do for another month to 3 months out from surgery -    Person(s) Educated Patient;Spouse   Methods  Explanation;Demonstration;Tactile cues;Verbal cues   Comprehension Verbal cues required;Returned demonstration;Verbalized understanding          OT Short Term Goals - 02/05/17 1102      OT SHORT TERM GOAL #1   Title Pt ind in wearing of custom splint and show improve MC extention at L 5th MC    Status Deferred     OT SHORT TERM GOAL #2   Title L 5th digit flexion improve at all joints to [facility] to touch palm to hold on to 1 cm objects   Status Achieved           OT Long Term Goals - 02/05/17 1102      OT LONG TERM GOAL #1   Title L hand grip strength improve to at least 50% compare to R hand to grip on to tools , golf club   Status Achieved     OT LONG TERM GOAL #2   Title Scar improve for pt to tolerate different textures , clapping hands and using tools    Status Achieved     OT LONG TERM GOAL #3   Title Function on PRWHE improve by at least 7 points and pain by 10 points    Baseline Pain on PRHWE 17/50 and function 9/50at eval - now 0/50 for both   Status Achieved                Plan - 02/05/17 1100    Clinical Impression Statement Pt made great progress from [facility] - ROM , scar healing ,pain and functional use - pt still with extention lag at 26th [facility] - pt to cont with scar mobs, cica scar pad and silicon digisleeve for another 4 wks to 3 months s/P surgery - and cont PROM for [facility] extention  - discharge with HEP - but can contact me if any issues    OT Treatment/Interventions Self-care/ADL training;Fluidtherapy;Splinting;Patient/family education;Therapeutic exercises;Contrast Bath;Ultrasound;Scar mobilization;Passive range of motion;Manual Therapy;Parrafin   Plan discharge with HEP for scar mobs, scar pads , PROM for MC extention until 90months s/p   OT Home Exercise Plan see pt instruction    Consulted and Agree with Plan of Care Patient      Patient will benefit from skilled therapeutic intervention in order to improve the following deficits and impairments:     Visit Diagnosis: Pain in left hand - Plan: Ot plan of care cert/re-cert  Stiffness of left hand, not elsewhere classified - Plan: Ot plan of care cert/re-cert  Muscle weakness (generalized) - Plan: Ot plan of care cert/re-cert  Scar condition and fibrosis of skin - Plan: Ot plan of care cert/re-cert  Localized edema - Plan: Ot plan of care cert/re-cert    Problem List Patient Active Problem List   Diagnosis Date Noted  . 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/01/2015  . Dyslipidemia 08/01/2015  . Acid indigestion 08/01/2015  . Fecal occult blood test positive 08/01/2015  . Allergic rhinitis, seasonal 08/01/2015  . Degeneration of intervertebral disc of lumbar region 02/01/2014  . Neuritis or radiculitis due to rupture of lumbar intervertebral disc 02/01/2014  . Lumbar canal stenosis 02/01/2014  . Degenerative arthritis of lumbar spine 02/01/2014    Rosalyn Gess OTR/L,CLT 02/05/2017, 11:06 AM  [facility] [address] [place], [address] Phone: [phone]   Fax:  [phone]  Name: ITHAN TOUHEY MRN: [id number] Date of Birth: 12/21/1950

## 2017-04-05 ENCOUNTER — Emergency Department
Admission: EM | Admit: 2017-04-05 | Discharge: 2017-04-05 | Disposition: A | Discharge status: Home / Self Care | Payer: PPO | Attending: Emergency Medicine | Admitting: Emergency Medicine

## 2017-04-05 ENCOUNTER — Encounter: Payer: Self-pay | Admitting: Emergency Medicine

## 2017-04-05 ENCOUNTER — Emergency Department: Payer: PPO

## 2017-04-05 ENCOUNTER — Ambulatory Visit (INDEPENDENT_AMBULATORY_CARE_PROVIDER_SITE_OTHER)
Admission: EM | Admit: 2017-04-05 | Discharge: 2017-04-05 | Disposition: A | Discharge status: Other Acute Inpatient Hospital | Payer: PPO | Source: Home / Self Care | Attending: Emergency Medicine | Admitting: Emergency Medicine

## 2017-04-05 DIAGNOSIS — I4891 Unspecified atrial fibrillation: Secondary | ICD-10-CM

## 2017-04-05 DIAGNOSIS — R079 Chest pain, unspecified: Secondary | ICD-10-CM

## 2017-04-05 DIAGNOSIS — Z79899 Other long term (current) drug therapy: Secondary | ICD-10-CM

## 2017-04-05 DIAGNOSIS — N400 Enlarged prostate without lower urinary tract symptoms: Secondary | ICD-10-CM

## 2017-04-05 DIAGNOSIS — N529 Male erectile dysfunction, unspecified: Secondary | ICD-10-CM

## 2017-04-05 DIAGNOSIS — R42 Dizziness and giddiness: Secondary | ICD-10-CM | POA: Insufficient documentation

## 2017-04-05 DIAGNOSIS — Z8041 Family history of malignant neoplasm of ovary: Secondary | ICD-10-CM

## 2017-04-05 DIAGNOSIS — Z7982 Long term (current) use of aspirin: Secondary | ICD-10-CM

## 2017-04-05 DIAGNOSIS — Z87891 Personal history of nicotine dependence: Secondary | ICD-10-CM | POA: Insufficient documentation

## 2017-04-05 DIAGNOSIS — R0789 Other chest pain: Secondary | ICD-10-CM | POA: Diagnosis present

## 2017-04-05 DIAGNOSIS — R000 Tachycardia, unspecified: Secondary | ICD-10-CM | POA: Diagnosis not present

## 2017-04-05 DIAGNOSIS — Z808 Family history of malignant neoplasm of other organs or systems: Secondary | ICD-10-CM

## 2017-04-05 LAB — BASIC METABOLIC PANEL WITH GFR
Anion gap: 7 (ref 5–15)
BUN: 11 mg/dL (ref 6–20)
CO2: 23 mmol/L (ref 22–32)
Calcium: 8.9 mg/dL (ref 8.9–10.3)
Chloride: 107 mmol/L (ref 101–111)
Creatinine, Ser: 0.72 mg/dL (ref 0.61–1.24)
GFR calc Af Amer: >60 mL/min (ref >60)
GFR calc non Af Amer: >60 mL/min (ref >60)
Glucose, Bld: 111 mg/dL — ABNORMAL HIGH (ref 65–99)
Potassium: 4.3 mmol/L (ref 3.5–5.1)
Sodium: 137 mmol/L (ref 135–145)

## 2017-04-05 LAB — CBC
HCT: 44.3 % (ref 40.0–52.0)
Hemoglobin: 15.6 g/dL (ref 13.0–18.0)
MCH: 36.2 pg — ABNORMAL HIGH (ref 26.0–34.0)
MCHC: 35.2 g/dL (ref 32.0–36.0)
MCV: 102.8 fL — ABNORMAL HIGH (ref 80.0–100.0)
Platelets: 204 K/uL (ref 150–440)
RBC: 4.31 MIL/uL — ABNORMAL LOW (ref 4.40–5.90)
RDW: 13.1 % (ref 11.5–14.5)
WBC: 5.6 K/uL (ref 3.8–10.6)

## 2017-04-05 LAB — TROPONIN I: Troponin I: <0.03 ng/mL (ref <0.03)

## 2017-04-05 MED ORDER — ASPIRIN 81 MG PO CHEW
324.0000 mg | CHEWABLE_TABLET | Freq: Once | ORAL | Status: AC
  Administered 2017-04-05: 324 mg via ORAL

## 2017-04-05 MED ORDER — METOPROLOL TARTRATE 50 MG PO TABS: 50.0000 mg | ORAL_TABLET | Freq: Two times a day (BID) | ORAL | 0 refills | Status: DC

## 2017-04-05 MED ORDER — METOPROLOL TARTRATE 50 MG PO TABS
50.0000 mg | ORAL_TABLET | Freq: Once | ORAL | Status: AC
  Administered 2017-04-05: 50 mg via ORAL
  Filled 2017-04-05: qty 1

## 2017-04-05 MED ORDER — METOPROLOL TARTRATE 5 MG/5ML IV SOLN
5.0000 mg | Freq: Once | INTRAVENOUS | Status: AC
  Administered 2017-04-05: 5 mg via INTRAVENOUS
  Filled 2017-04-05: qty 5

## 2017-04-05 MED ORDER — METOPROLOL TARTRATE 50 MG PO TABS: 50.0000 mg | ORAL_TABLET | Freq: Two times a day (BID) | ORAL | 11 refills | Status: DC

## 2017-04-05 NOTE — ED Triage Notes (Signed)
Patient via [facility] from [facility] urgent care. Reports chest pain and rapid heart rate starting last night with dizziness starting this morning. At urgent care EKG showed new onset Afib. Patient denies symptoms at this time. Denies cardiac history.

## 2017-04-05 NOTE — ED Provider Notes (Signed)
[facility] Emergency Department Provider Note   ____________________________________________    I have reviewed the triage vital signs and the nursing notes.   HISTORY  Chief Complaint Atrial Fibrillation     HPI Alexander Carey is a 66 y.o. male Who presents with complaints of racing heart. Patient sent from urgent care because of these symptoms. He was found to have atrial fibrillation there. Patient reports yesterday he developed racing heart and had chest pressure,his chest pressure improved overnight but today his heart rate again seemed to be racing and he felt mildly lightheaded.no chest pain today. No recent travel. No nausea or vomiting. No fevers or chills. No history of the same. No history of heart disease. He denies smoking although family reports he smokes 3-4 cigarettes a day.   Past Medical History:  Diagnosis Date  . ED (erectile dysfunction)   . Enlarged prostate   . Hypogonadism in male     Patient Active Problem List   Diagnosis Date Noted  . 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/01/2015  . Dyslipidemia 08/01/2015  . Acid indigestion 08/01/2015  . Fecal occult blood test positive 08/01/2015  . Allergic rhinitis, seasonal 08/01/2015  . Degeneration of intervertebral disc of lumbar region 02/01/2014  . Neuritis or radiculitis due to rupture of lumbar intervertebral disc 02/01/2014  . Lumbar canal stenosis 02/01/2014  . Degenerative arthritis of lumbar spine 02/01/2014    Past Surgical History:  Procedure Laterality Date  . COLONOSCOPY  04/2005  . TONSILLECTOMY    . 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 Authorizing Provider  aspirin 81 MG tablet Take 81 mg by mouth  daily.   Yes [provider]  dutasteride (AVODART) 0.5 MG capsule Take 1 capsule (0.5 mg total) by mouth daily. 09/22/16  Yes McGowan, Larene Beach A, PA-C  Multiple Vitamin (MULTIVITAMIN) tablet Take 1 tablet by mouth daily.   Yes [provider]  tadalafil (CIALIS) 20 MG tablet Take 1 tablet (20 mg total) by mouth daily as needed for erectile dysfunction. 09/22/16  Yes McGowan, Larene Beach A, PA-C  tamsulosin (FLOMAX) 0.4 MG CAPS capsule Take 1 capsule (0.4 mg total) by mouth daily. 09/22/16  Yes McGowan, Larene Beach A, PA-C  HYDROcodone-acetaminophen (NORCO) 5-325 MG tablet Take 1-2 tablets by mouth every 6 (six) hours as needed for moderate pain. MAXIMUM TOTAL ACETAMINOPHEN DOSE IS 4000 MG PER DAY Patient not taking: Reported on 04/05/2017 12/03/16   Poggi, Marshall Cork, MD  metoprolol tartrate (LOPRESSOR) 50 MG tablet Take 1 tablet (50 mg total) by mouth 2 (two) times daily. 04/05/17 05/05/17  Lavonia Drafts, MD     Allergies Ciprofloxacin  Family History  Problem Relation Age of Onset  . Ovarian cancer Mother   . Throat cancer Father   . Prostate cancer Neg Hx   . Kidney cancer Neg Hx     Social History Social History  Substance Use Topics  . Smoking status: Former Smoker    Packs/day: 0.50    Types: Cigarettes    Quit date: 09/15/2008  . Smokeless tobacco: Never Used  . Alcohol use 8.4 oz/week    14 Shots of liquor per week    Review of Systems  Constitutional: No fever/chills Eyes: No visual  changes.  ENT: No sore throat. Cardiovascular: as above Respiratory: Denies shortness of breath. Gastrointestinal: No abdominal pain.  No nausea, no vomiting.   Genitourinary: Negative for dysuria. Musculoskeletal: Negative for back pain. Skin: Negative for rash. Neurological: Negative for headaches   ____________________________________________   PHYSICAL EXAM:  VITAL SIGNS: ED Triage Vitals  Enc Vitals Group     BP --      Pulse Rate 04/05/17 1051 (!) 122     Resp 04/05/17  1051 (!) 22     Temp 04/05/17 1051 97.8 F (36.6 C)     Temp Source 04/05/17 1051 Oral     SpO2 04/05/17 1051 98 %     Weight 04/05/17 1054 90.7 kg (200 lb)     Height 04/05/17 1054 1.829 m (6')     Head Circumference --      Peak Flow --      Pain Score --      Pain Loc --      Pain Edu? --      Excl. in [place]? --     Constitutional: Alert and oriented. No acute distress. Pleasant and interactive Eyes: Conjunctivae are normal.   Nose: No congestion/rhinnorhea. Mouth/Throat: Mucous membranes are moist.   Cardiovascular: irregularly irregular rate, tachycardia. Grossly normal heart sounds.  Good peripheral circulation. Respiratory: Normal respiratory effort.  No retractions. Lungs CTAB. Gastrointestinal: Soft and nontender. No distention.  No CVA tenderness. Genitourinary: deferred Musculoskeletal: No lower extremity tenderness nor edema.  Warm and well perfused Neurologic:  Normal speech and language. No gross focal neurologic deficits are appreciated.  Skin:  Skin is warm, dry and intact. No rash noted. Psychiatric: Mood and affect are normal. Speech and behavior are normal.  ____________________________________________   LABS (all labs ordered are listed, but only abnormal results are displayed)  Labs Reviewed  BASIC METABOLIC PANEL - Abnormal; Notable for the following:       Result Value   Glucose, Bld 111 (*)    All other components within normal limits  CBC - Abnormal; Notable for the following:    RBC 4.31 (*)    MCV 102.8 (*)    MCH 36.2 (*)    All other components within normal limits  TROPONIN I   ____________________________________________  EKG  ED ECG REPORT I, Lavonia Drafts, the attending physician, personally viewed and interpreted this ECG.  Date: 04/05/2017  Rhythm: A. Fib with RVR QRS Axis: normal Intervals: abnormal ST/T Wave abnormalities: normal Narrative Interpretation: A.  fib ____________________________________________  RADIOLOGY  Chest x-ray Unremarkable ____________________________________________   PROCEDURES  Procedure(s) performed: No    Critical Care performed: yes  CRITICAL CARE Performed by: Lavonia Drafts   Total critical care time: 31minutes  Critical care time was exclusive of separately billable procedures and treating other patients.  Critical care was necessary to treat or prevent imminent or life-threatening deterioration.  Critical care was time spent personally by me on the following activities: development of treatment plan with patient and/or surrogate as well as nursing, discussions with consultants, evaluation of patient's response to treatment, examination of patient, obtaining history from patient or surrogate, ordering and performing treatments and interventions, ordering and review of laboratory studies, ordering and review of radiographic studies, pulse oximetry and re-evaluation of patient's condition. ____________________________________________   INITIAL IMPRESSION / ASSESSMENT AND PLAN / ED COURSE  Pertinent labs & imaging results that were available during my care of the patient were reviewed by me and considered in my medical decision making (see  chart for details).  Patient presents with new onset A. Fib with RVR, symptomatic but blood pressure is stable. Cardizem on national backorder, we'll give 5 mg of metoprolol and monitor carefully.  Discussed with daughter Paraschos of cardiology, he recommends 50 of by mouth metoprolol tartrate given good response to IV metoprolol.  Patient monitored after given metoprolol tartrate, heart rate remains well-controlled. Lab work is unremarkable. Dr. Saralyn Pilar feels discharge is appropriate with outpatient follow-up with him. No need for anticoagulation at this point given low chads   Discussed with patient and family and they approve of this plan. Patient knows to  return if symptoms redevelop.    ____________________________________________   FINAL CLINICAL IMPRESSION(S) / ED DIAGNOSES  Final diagnoses:  Atrial fibrillation with RVR ([place])      NEW MEDICATIONS STARTED DURING THIS VISIT:  Discharge Medication List as of 04/05/2017  1:37 PM       Note:  This document was prepared using Dragon voice recognition software and may include unintentional dictation errors.    Lavonia Drafts, MD 04/05/17 [phone]

## 2017-04-05 NOTE — ED Provider Notes (Signed)
HPI  SUBJECTIVE:  Alexander Carey is a 66 y.o. male who presents with  diffuse chest pain described as pressure for an hour last night starting at 0100. He reports dizziness described as lightheadedness and palpitations. This started while he was in bed. He did not get up and walk around. He denies radiation to his neck, jaw, arm, back. He tried raising the head of the bed which seemed to help, no aggravating factors. This morning he reports a return of his palpitations/rapid heart rate and lightheadedness. He denies nausea, diaphoresis, presyncope, syncope. No abdominal pain, shortness of breath, hemoptysis, cough. No calf pain, immobilization, surgery in the past 4 weeks. Denies excess EtOH use last night. Past medical history of BPH. No history of diabetes, hypertension, atrial fibrillation, MI, hypercholesterolemia, smoking, HIV. LFY:BOFBPZ, Guy Begin, MD  Past Medical History:  Diagnosis Date  . ED (erectile dysfunction)   . Enlarged prostate   . Hypogonadism in male     Past Surgical History:  Procedure Laterality Date  . COLONOSCOPY  04/2005  . TONSILLECTOMY    . 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 Ovarian cancer Mother   . Throat cancer Father   . Prostate cancer Neg Hx   . Kidney cancer Neg Hx     Social History  Substance Use Topics  . Smoking status: Former Smoker    Packs/day: 0.50    Types: Cigarettes    Quit date: 09/15/2008  . Smokeless tobacco: Never Used  . Alcohol use 8.4 oz/week    14 Shots of liquor per week    No current facility-administered medications for this encounter.   Current Outpatient Prescriptions:  .  aspirin 81 MG tablet, Take 81 mg by mouth daily., Disp: , Rfl:  .  dutasteride (AVODART) 0.5 MG  capsule, Take 1 capsule (0.5 mg total) by mouth daily., Disp: 90 capsule, Rfl: 3 .  HYDROcodone-acetaminophen (NORCO) 5-325 MG tablet, Take 1-2 tablets by mouth every 6 (six) hours as needed for moderate pain. MAXIMUM TOTAL ACETAMINOPHEN DOSE IS 4000 MG PER DAY, Disp: 30 tablet, Rfl: 0 .  Multiple Vitamin (MULTIVITAMIN) tablet, Take 1 tablet by mouth daily., Disp: , Rfl:  .  tadalafil (CIALIS) 20 MG tablet, Take 1 tablet (20 mg total) by mouth daily as needed for erectile dysfunction., Disp: 6 tablet, Rfl: 12 .  tamsulosin (FLOMAX) 0.4 MG CAPS capsule, Take 1 capsule (0.4 mg total) by mouth daily., Disp: 90 capsule, Rfl: 3  Allergies  Allergen Reactions  . Ciprofloxacin     Other reaction(s): Unknown    ROS  As noted in HPI.   Physical Exam  BP 111/74 (BP Location: Right Arm)   Pulse (!) 54   Temp 97.8 F (36.6 C) (Oral)   Resp 16   Ht 6' (1.829 m)   Wt 200 lb (90.7 kg)   SpO2 99%   BMI 27.12 kg/m   Constitutional: Well developed, well nourished, no acute distress Eyes:  EOMI, conjunctiva normal bilaterally HENT: Normocephalic, atraumatic,mucus membranes moist Respiratory: Normal inspiratory effort lungs clear bilaterally Cardiovascular: Irregularly irregular. No murmurs rubs gallops GI: nondistended soft, normal bowel sounds, no palpable masses skin: No rash, skin intact Musculoskeletal: no deformities Neurologic: Alert & oriented x 3, no focal neuro deficits  Psychiatric: Speech and behavior appropriate   ED Course   Medications  aspirin chewable tablet 324 mg (324 mg Oral Given 04/05/17 0958)    Orders Placed This Encounter  Procedures  . ED EKG    Standing Status:   Standing    Number of Occurrences:   1    Order Specific Question:   Reason for Exam    Answer:   Chest Pain  . EKG 12-Lead    Standing Status:   Standing    Number of Occurrences:   1    No results found for this or any previous visit (from the past 24 hour(s)). No results found.  ED  Clinical Impression  Atrial fibrillation, unspecified type [facility])  Chest pain, unspecified type  ED Assessment/Plan  Care everywhere records reviewed. No history of atrial fibrillation or EKGs that I can find.  EKG: Atrial fibrillation rate 113. And left axis deviation, no hypertrophy. No ST elevation. Q waves in 3, V1, V2. No previous EKG for comparison.  Patient with new onset atrial fibrillation and is symptomatic. He also had an episode of chest pain last night that is concerning for cardiac etiology. Transferring to [facility] ED via EMS for cardiac monitoring. Gave 324 mg of aspirin here.  Discussed EKG and rationale for transfer to the ER with the family. They agree with plan.    Meds ordered this encounter  Medications  . aspirin chewable tablet 324 mg    *This clinic note was created using [employer]. Therefore, there may be occasional mistakes despite careful proofreading.  ?   Melynda Ripple, MD 04/05/17 1025

## 2017-04-05 NOTE — ED Triage Notes (Signed)
Patient c/o chest pain and rapid heart rate that started last night.  Patient reports some dizziness.  Patient denies SOB.  Patient denies N/V.

## 2017-04-05 NOTE — ED Notes (Signed)
EMS called to transport patient to [facility] ED 

## 2017-04-06 DIAGNOSIS — I480 Paroxysmal atrial fibrillation: Secondary | ICD-10-CM | POA: Diagnosis not present

## 2017-04-06 DIAGNOSIS — Z1329 Encounter for screening for other suspected endocrine disorder: Secondary | ICD-10-CM | POA: Diagnosis not present

## 2017-04-06 DIAGNOSIS — R0789 Other chest pain: Secondary | ICD-10-CM | POA: Diagnosis not present

## 2017-04-06 DIAGNOSIS — E785 Hyperlipidemia, unspecified: Secondary | ICD-10-CM | POA: Diagnosis not present

## 2017-04-15 DIAGNOSIS — I480 Paroxysmal atrial fibrillation: Secondary | ICD-10-CM | POA: Diagnosis not present

## 2017-05-01 DIAGNOSIS — I480 Paroxysmal atrial fibrillation: Secondary | ICD-10-CM | POA: Diagnosis not present

## 2017-05-07 DIAGNOSIS — E785 Hyperlipidemia, unspecified: Secondary | ICD-10-CM | POA: Diagnosis not present

## 2017-05-07 DIAGNOSIS — I480 Paroxysmal atrial fibrillation: Secondary | ICD-10-CM | POA: Diagnosis not present

## 2017-06-04 DIAGNOSIS — M7541 Impingement syndrome of right shoulder: Secondary | ICD-10-CM | POA: Diagnosis not present

## 2017-06-04 DIAGNOSIS — M19011 Primary osteoarthritis, right shoulder: Secondary | ICD-10-CM | POA: Diagnosis not present

## 2017-08-11 DIAGNOSIS — G4730 Sleep apnea, unspecified: Secondary | ICD-10-CM | POA: Diagnosis not present

## 2017-08-11 DIAGNOSIS — I480 Paroxysmal atrial fibrillation: Secondary | ICD-10-CM | POA: Diagnosis not present

## 2017-09-21 NOTE — Progress Notes (Signed)
09/22/2017 9:09 AM   Alexander Carey Jan 23, 1951 [id number]  Referring provider: Valera Castle, [place], [place] [address]  Chief Complaint  Patient presents with  . Benign Prostatic Hypertrophy    HPI: Patient is a 67 year old Caucasian male with BPH with LU TS and erectile dysfunction who presents today for yearly follow-up.  BPH WITH LUTS His IPSS score today is 8, which is moderate lower urinary tract symptomatology.  He is pleased with his quality life due to his urinary symptoms.  His previous IPSS score was 8/1.  His major complaints today are mild frequency.  He has had these symptoms for awhile.  He denies any dysuria, hematuria or suprapubic pain.   He currently taking tamsulosin 0.4 mg and dutasteride 0.5 mg daily.  He also denies any recent fevers, chills, nausea or vomiting.  He does not have a family history of PCa. IPSS    Row Name 09/22/17 0800         International Prostate Symptom Score   How often have you had the sensation of not emptying your bladder?  Less than 1 in 5     How often have you had to urinate less than every two hours?  Less than half the time     How often have you found you stopped and started again several times when you urinated?  Less than 1 in 5 times     How often have you found it difficult to postpone urination?  Less than 1 in 5 times     How often have you had a weak urinary stream?  Less than 1 in 5 times     How often have you had to strain to start urination?  Less than 1 in 5 times     How many times did you typically get up at night to urinate?  1 Time     Total IPSS Score  8       Quality of Life due to urinary symptoms   If you were to spend the rest of your life with your urinary condition just the way it is now how would you feel about that?  Pleased        Score:  1-7 Mild 8-19 Moderate 20-35 Severe  Erectile dysfunction His SHIM score is 12, which is mild to moderate ED.   His previous  SHIM score was 16.  He has been having difficulty with erections for several years.   His major complaint is lack of firmness.  His libido is preserved.   His risk factors for ED are age, BPH, HTN and HLD.  He denies any painful erections or curvatures with his erections.   He is finding the Cialis not as effective as in the past.  Viagra gives him a headache.   [place] Name 09/22/17 0853         SHIM: Over the last 6 months:   How do you rate your confidence that you could get and keep an erection?  Low     When you had erections with sexual stimulation, how often were your erections hard enough for penetration (entering your partner)?  A Few Times (much less than half the time)     During sexual intercourse, how often were you able to maintain your erection after you had penetrated (entered) your partner?  A Few Times (much less than half the time)     During sexual  intercourse, how difficult was it to maintain your erection to completion of intercourse?  Difficult     When you attempted sexual intercourse, how often was it satisfactory for you?  Sometimes (about half the time)       SHIM Total Score   SHIM  12        Score: 1-7 Severe ED 8-11 Moderate ED 12-16 Mild-Moderate ED 17-21 Mild ED 22-25 No ED    PMH: Past Medical History:  Diagnosis Date  . ED (erectile dysfunction)   . Enlarged prostate   . Hypogonadism in male     Surgical History: Past Surgical History:  Procedure Laterality Date  . COLONOSCOPY  04/2005  . TONSILLECTOMY    . 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 Always use your most recent med  list.          dutasteride 0.5 MG capsule Commonly known as:  AVODART Take 1 capsule (0.5 mg total) by mouth daily.   ELIQUIS 5 MG Tabs tablet Generic drug:  apixaban   metoprolol tartrate 50 MG tablet Commonly known as:  LOPRESSOR Take 1 tablet (50 mg total) by mouth 2 (two) times daily.   multivitamin tablet Take 1 tablet by mouth daily.   tadalafil 20 MG tablet Commonly known as:  CIALIS Take 1 tablet (20 mg total) by mouth daily as needed for erectile dysfunction.   tamsulosin 0.4 MG Caps capsule Commonly known as:  FLOMAX Take 1 capsule (0.4 mg total) by mouth daily.   vardenafil 20 MG tablet Commonly known as:  LEVITRA Take 1 tablet (20 mg total) by mouth daily as needed for erectile dysfunction.       Allergies:  Allergies  Allergen Reactions  . Ciprofloxacin     Other reaction(s): Unknown    Family History: Family History  Problem Relation Age of Onset  . Ovarian cancer Mother   . Throat cancer Father   . Prostate cancer Neg Hx   . Kidney cancer Neg Hx     Social History:  reports that he quit smoking about 9 years ago. His smoking use included cigarettes. He smoked 0.50 packs per day. he has never used smokeless tobacco. He reports that he drinks about 8.4 oz of alcohol per week. He reports that he does not use drugs.  ROS: UROLOGY Frequent Urination?: No Hard to postpone urination?: No Burning/pain with urination?: No Get up at night to urinate?: No Leakage of urine?: No Urine stream starts and stops?: No Trouble starting stream?: No Do you have to strain to urinate?: No Blood in urine?: No Urinary tract infection?: No Sexually transmitted disease?: No Injury to kidneys or bladder?: No Painful intercourse?: No Weak stream?: No Erection problems?: No Penile pain?: No  Gastrointestinal Nausea?: No Vomiting?: No Indigestion/heartburn?: No Diarrhea?: No Constipation?: No  Constitutional Fever: No Night sweats?: No Weight loss?:  No Fatigue?: No  Skin Skin rash/lesions?: No Itching?: No  Eyes Blurred vision?: No Double vision?: No  Ears/Nose/Throat Sore throat?: No Sinus problems?: No  Hematologic/Lymphatic Swollen glands?: No Easy bruising?:  No  Cardiovascular Leg swelling?: No Chest pain?: No  Respiratory Cough?: No Shortness of breath?: No  Endocrine Excessive thirst?: No  Musculoskeletal Back pain?: No Joint pain?: No  Neurological Headaches?: No Dizziness?: No  Psychologic Depression?: No Anxiety?: No  Physical Exam: BP (!) 160/88 (BP Location: Right Arm, Patient Position: Sitting, Cuff Size: Normal)   Pulse 78   Ht 6' (1.829 m)   Wt 216 lb 8 oz (98.2 kg)   BMI 29.36 kg/m   Constitutional: Well nourished. Alert and oriented, No acute distress. HEENT: [place] AT, moist mucus membranes. Trachea midline, no masses. Cardiovascular: No clubbing, cyanosis, or edema. Respiratory: Normal respiratory effort, no increased work of breathing. GI: Abdomen is soft, non tender, non distended, no abdominal masses. Liver and spleen not palpable.  No hernias appreciated.  Stool sample for occult testing is not indicated.   GU: No CVA tenderness.  No bladder fullness or masses.  Patient with uncircumcised phallus. Foreskin easily retracted  Urethral meatus is patent.  No penile discharge. No penile lesions or rashes. Scrotum without lesions, cysts, rashes and/or edema.  Testicles are located scrotally bilaterally. No masses are appreciated in the testicles. Left and right epididymis are normal. Rectal: Patient with  normal sphincter tone. Anus and perineum without scarring or rashes. No rectal masses are appreciated. Prostate is approximately 45 grams, no nodules are appreciated. Seminal vesicles are normal. Skin: No rashes, bruises or suspicious lesions. Lymph: No cervical or inguinal adenopathy. Neurologic: Grossly intact, no focal deficits, moving all 4 extremities. Psychiatric: Normal mood and  affect.   Laboratory Data: PSA History 12/2012   PSA 0.2 07/2014 PSA 0.3 09/2016 PSA 0.4  Assessment & Plan:    1. BPH with LUTS  - IPSS score is 8/1, it is stable  - Continue conservative management, avoiding bladder irritants and timed voiding's  - Continue tamsulosin 0.4 mg daily and dutasteride 0.5 mg daily; refills given  - RTC in 12 months for IPSS, PSA and exam    2. Erectile dysfunction  - SHIM score is 12, it is slightly worsening  -he would like to try a different PDE5i- Levitra 20 mg sent to pharmacy  - RTC in 12 months for repeat SHIM score and exam    Return in about 1 year (around 09/22/2018) for IPSS, SHIM, PSA and exam.  These notes generated with voice recognition software. I apologize for typographical errors.  Zara Council, [facility] [address] [place],  [address] [phone]

## 2017-09-22 ENCOUNTER — Ambulatory Visit: Payer: PPO | Admitting: Urology

## 2017-09-22 ENCOUNTER — Encounter: Payer: Self-pay | Admitting: Urology

## 2017-09-22 VITALS — BP 160/88 | HR 78 | Ht 72.0 in | Wt 216.5 lb

## 2017-09-22 DIAGNOSIS — N138 Other obstructive and reflux uropathy: Secondary | ICD-10-CM

## 2017-09-22 DIAGNOSIS — N401 Enlarged prostate with lower urinary tract symptoms: Secondary | ICD-10-CM

## 2017-09-22 DIAGNOSIS — N529 Male erectile dysfunction, unspecified: Secondary | ICD-10-CM | POA: Diagnosis not present

## 2017-09-22 MED ORDER — TAMSULOSIN HCL 0.4 MG PO CAPS: 0.4000 mg | ORAL_CAPSULE | Freq: Every day | ORAL | 3 refills | Status: DC

## 2017-09-22 MED ORDER — VARDENAFIL HCL 20 MG PO TABS: 20.0000 mg | ORAL_TABLET | Freq: Every day | ORAL | 11 refills | Status: DC | PRN

## 2017-09-22 MED ORDER — DUTASTERIDE 0.5 MG PO CAPS: 0.5000 mg | ORAL_CAPSULE | Freq: Every day | ORAL | 3 refills | Status: DC

## 2017-09-23 LAB — PSA: PROSTATE SPECIFIC AG, SERUM: 0.3 ng/mL (ref 0.0–4.0)

## 2017-09-25 ENCOUNTER — Telehealth: Payer: Self-pay

## 2017-09-25 NOTE — Telephone Encounter (Signed)
Patient notified

## 2017-09-25 NOTE — Telephone Encounter (Signed)
-----   Message from Nori Riis, PA-C sent at 09/23/2017  9:54 AM EST ----- Please let Mr. Seever know that his PSA is stable at 0.3.

## 2017-10-09 DIAGNOSIS — M7541 Impingement syndrome of right shoulder: Secondary | ICD-10-CM | POA: Diagnosis not present

## 2017-10-13 ENCOUNTER — Other Ambulatory Visit: Payer: Self-pay | Admitting: Family Medicine

## 2017-10-13 ENCOUNTER — Ambulatory Visit
Admission: RE | Admit: 2017-10-13 | Discharge: 2017-10-13 | Disposition: A | Discharge status: Home / Self Care | Payer: PPO | Source: Ambulatory Visit | Attending: Family Medicine | Admitting: Family Medicine

## 2017-10-13 DIAGNOSIS — J9801 Acute bronchospasm: Secondary | ICD-10-CM | POA: Insufficient documentation

## 2017-10-13 DIAGNOSIS — R062 Wheezing: Secondary | ICD-10-CM

## 2017-10-13 DIAGNOSIS — R0602 Shortness of breath: Secondary | ICD-10-CM

## 2017-10-15 ENCOUNTER — Other Ambulatory Visit: Payer: Self-pay | Admitting: Orthopedic Surgery

## 2017-10-15 DIAGNOSIS — M7541 Impingement syndrome of right shoulder: Secondary | ICD-10-CM

## 2017-10-16 DIAGNOSIS — M4316 Spondylolisthesis, lumbar region: Secondary | ICD-10-CM | POA: Diagnosis not present

## 2017-10-16 DIAGNOSIS — G8929 Other chronic pain: Secondary | ICD-10-CM | POA: Diagnosis not present

## 2017-10-16 DIAGNOSIS — M545 Low back pain: Secondary | ICD-10-CM | POA: Diagnosis not present

## 2017-10-16 DIAGNOSIS — Z6829 Body mass index (BMI) 29.0-29.9, adult: Secondary | ICD-10-CM | POA: Diagnosis not present

## 2017-10-22 ENCOUNTER — Ambulatory Visit: Payer: PPO

## 2017-10-27 ENCOUNTER — Other Ambulatory Visit: Payer: Self-pay | Admitting: Neurosurgery

## 2017-10-27 DIAGNOSIS — M4316 Spondylolisthesis, lumbar region: Secondary | ICD-10-CM

## 2017-10-28 DIAGNOSIS — G4733 Obstructive sleep apnea (adult) (pediatric): Secondary | ICD-10-CM | POA: Diagnosis not present

## 2017-10-29 ENCOUNTER — Ambulatory Visit
Admission: RE | Admit: 2017-10-29 | Discharge: 2017-10-29 | Disposition: A | Discharge status: Home / Self Care | Payer: PPO | Source: Ambulatory Visit | Attending: Orthopedic Surgery | Admitting: Orthopedic Surgery

## 2017-10-29 ENCOUNTER — Encounter: Payer: Self-pay | Admitting: Internal Medicine

## 2017-10-29 ENCOUNTER — Ambulatory Visit (INDEPENDENT_AMBULATORY_CARE_PROVIDER_SITE_OTHER): Payer: PPO | Admitting: Internal Medicine

## 2017-10-29 VITALS — BP 110/72 | HR 110 | Ht 72.0 in | Wt 213.0 lb

## 2017-10-29 DIAGNOSIS — M7541 Impingement syndrome of right shoulder: Secondary | ICD-10-CM | POA: Insufficient documentation

## 2017-10-29 DIAGNOSIS — I481 Persistent atrial fibrillation: Secondary | ICD-10-CM | POA: Diagnosis not present

## 2017-10-29 DIAGNOSIS — I480 Paroxysmal atrial fibrillation: Secondary | ICD-10-CM

## 2017-10-29 DIAGNOSIS — I4819 Other persistent atrial fibrillation: Secondary | ICD-10-CM

## 2017-10-29 DIAGNOSIS — H81399 Other peripheral vertigo, unspecified ear: Secondary | ICD-10-CM | POA: Diagnosis not present

## 2017-10-29 LAB — CBC
HEMATOCRIT: 46.8 % (ref 40.0–52.0)
Hemoglobin: 15.5 g/dL (ref 13.0–18.0)
MCH: 35.6 pg — ABNORMAL HIGH (ref 26.0–34.0)
MCHC: 33.2 g/dL (ref 32.0–36.0)
MCV: 107.2 fL — AB (ref 80.0–100.0)
Platelets: 165 10*3/uL (ref 150–440)
RBC: 4.36 MIL/uL — ABNORMAL LOW (ref 4.40–5.90)
RDW: 13.6 % (ref 11.5–14.5)
WBC: 8 10*3/uL (ref 3.8–10.6)

## 2017-10-29 LAB — PROTIME-INR
INR: 0.97
Prothrombin Time: 12.8 seconds (ref 11.4–15.2)

## 2017-10-29 LAB — APTT: APTT: 26 s (ref 24–36)

## 2017-10-29 MED ORDER — METHYLPREDNISOLONE SODIUM SUCC 125 MG IJ SOLR
125.0000 mg | Freq: Once | INTRAMUSCULAR | Status: AC
  Administered 2017-10-29: 125 mg via INTRAMUSCULAR
  Filled 2017-10-29: qty 2

## 2017-10-29 MED ORDER — LIDOCAINE HCL (PF) 1 % IJ SOLN
5.0000 mL | Freq: Once | INTRAMUSCULAR | Status: AC
  Administered 2017-10-29: 5 mL
  Filled 2017-10-29: qty 5

## 2017-10-29 MED ORDER — BUPIVACAINE HCL (PF) 0.25 % IJ SOLN
7.0000 mL | Freq: Once | INTRAMUSCULAR | Status: AC
  Administered 2017-10-29: 7 mL via INTRA_ARTICULAR
  Filled 2017-10-29: qty 10

## 2017-10-29 MED ORDER — IOPAMIDOL (ISOVUE-200) INJECTION 41%
4.0000 mL | Freq: Once | INTRAVENOUS | Status: AC | PRN
  Administered 2017-10-29: 4 mL
  Filled 2017-10-29: qty 50

## 2017-10-29 NOTE — Patient Instructions (Addendum)
Medication Instructions: - Your physician recommends that you continue on your current medications as directed. Please refer to the Current Medication list given to you today.  Labwork: - none ordered  Procedures/Testing: - Your physician has recommended that you have a brain MRI.   - Your physician has recommended that you wear an event monitor. Event monitors are medical devices that record the heart's electrical activity. Doctors most often [place] these monitors to diagnose arrhythmias. Arrhythmias are problems with the speed or rhythm of the heartbeat. The monitor is a small, portable device. You can wear one while you do your normal daily activities. This is usually used to diagnose what is causing palpitations/syncope (passing out).- This will be mailed to you from [employer].    Follow-Up: - Your physician recommends that you schedule a follow-up appointment in: 5-6 weeks   Any Additional Special Instructions Will Be Listed Below (If Applicable).     If you need a refill on your cardiac medications before your next appointment, please call your pharmacy.

## 2017-10-29 NOTE — Progress Notes (Signed)
Pt ambulatory to rm 9 for pre-procedure labs.  Pt reports last dose of eliquis was 10/26/17.

## 2017-10-29 NOTE — Progress Notes (Signed)
ELECTROPHYSIOLOGY CONSULT NOTE  Patient ID: Alexander Carey, MRN: [id number], DOB/AGE: 06-01-1951 67 y.o. Admit date: (Not on file) Date of Consult: 10/29/2017  Primary Physician: Valera Castle, MD Primary Cardiologist: [facility]     Alexander Carey is a 67 y.o. male who is being seen today for the evaluation of afib at the request of DrAP.    HPI Alexander Carey is a 67 y.o. male  With AFib diagnosed 7/18 when he presented with RVR Rx with metoprolol and apixoban-- which he would take sometimes once daily.  Holter was reported to show  paroxysms of atrial fibrillation but on personal review no sinus rhythm, on the strips we had access to, seen  He has had episodes of dizziness.  There are 3 separate types.  The first is orthostatic with rapid resolution.  This is been ascribed to his alpha blockers.  The second is very brief and presyncopal lasting just seconds.  Third is non-positional and can last 30-60 minutes.  He has had infrequent palpitations.  He and his wife have noted no significant impact on his exercise tolerance.  He has had some nuisance bleeding with his Eliquis taking it sometimes just daily  Sleep study>>+ OSA . Just started  On CPAP  He denies nocturnal dyspnea peripheral edema  His alcohol intake is exuberant, admitting to 1-2 shots per day.   DATE TEST EF   7/18 Echo   55-65 %                 Date Cr Hgb  7//18 0.72 15.6  2/19          Past Medical History:  Diagnosis Date  . ED (erectile dysfunction)   . Enlarged prostate   . Hypogonadism in male   . Paroxysmal A-fib [facility])       Surgical History:  Past Surgical History:  Procedure Laterality Date  . COLONOSCOPY  04/2005  . TONSILLECTOMY    . 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 Authorizing Provider  dutasteride (AVODART) 0.5 MG capsule Take 1 capsule (0.5 mg total) by mouth daily. 09/22/17   McGowan, Hunt Oris, PA-C  ELIQUIS 5 MG TABS tablet  08/03/17   [provider]  metoprolol tartrate (LOPRESSOR) 50 MG tablet Take 1 tablet (50 mg total) by mouth 2 (two) times daily. 04/05/17 09/22/17  Lavonia Drafts, MD  Multiple Vitamin (MULTIVITAMIN) tablet Take 1 tablet by mouth daily.    [provider]  tadalafil (CIALIS) 20 MG tablet Take 1 tablet (20 mg total) by mouth daily as needed for erectile dysfunction. 09/22/16   Zara Council A, PA-C  tamsulosin (FLOMAX) 0.4 MG CAPS capsule Take 1 capsule (0.4 mg total) by mouth daily. 09/22/17   Zara Council A, PA-C  vardenafil (LEVITRA) 20 MG tablet Take 1 tablet (20 mg total) by mouth daily as needed for erectile dysfunction. 09/22/17   Nori Riis, PA-C    Allergies:  Allergies  Allergen Reactions  . Ciprofloxacin     Other reaction(s): Unknown  . Other     Social History   Socioeconomic History  . Marital status: Married    Spouse name: Not on file  .  Number of children: Not on file  . Years of education: Not on file  . Highest education level: Not on file  Social Needs  . Financial resource strain: Not on file  . Food insecurity - worry: Not on file  . Food insecurity - inability: Not on file  . Transportation needs - medical: Not on file  . Transportation needs - non-medical: Not on file  Occupational History  . Not on file  Tobacco Use  . Smoking status: Former Smoker    Packs/day: 0.50    Types: Cigarettes    Last attempt to quit: 09/15/2008    Years since quitting: 9.1  . Smokeless tobacco: Never Used  Substance and Sexual Activity  . Alcohol use: Yes    Alcohol/week: 8.4 oz    Types: 14 Shots of liquor per week  . Drug use: No  . Sexual activity: Not on file  Other Topics Concern  . Not on  file  Social History Narrative  . Not on file     Family History  Problem Relation Age of Onset  . Ovarian cancer Mother   . Throat cancer Father   . Prostate cancer Neg Hx   . Kidney cancer Neg Hx      ROS:  Please see the history of present illness.     All other systems reviewed and negative.    Physical Exam:  Blood pressure 110/72, pulse (!) 110, height 6' (1.829 m), weight 213 lb (96.6 kg). General: Well developed, well nourished male in no acute distress. Head: Normocephalic, atraumatic, sclera non-icteric, no xanthomas, nares are without discharge. EENT: normal  Lymph Nodes:  none Neck: Negative for carotid bruits. JVD not elevated. Back:without scoliosis kyphosis Lungs: Clear bilaterally to auscultation without wheezes, rales, or rhonchi. Breathing is unlabored. Heart: Irregularly irregular rhythm and rate with no murmur . No rubs, or gallops appreciated. Abdomen: Soft, non-tender, non-distended with normoactive bowel sounds. No hepatomegaly. No rebound/guarding. No obvious abdominal masses. Msk:  Strength and tone appear normal for age. Extremities: No clubbing or cyanosis. No  edema.  Distal pedal pulses are 2+ and equal bilaterally. Skin: Warm and Dry Neuro: Alert and oriented X 3. CN III-XII intact Grossly normal sensory and motor function . Psych:  Responds to questions appropriately with a normal affect.      Labs: Cardiac Enzymes No results for input(s): CKTOTAL, CKMB, TROPONINI in the last 72 hours. CBC Lab Results  Component Value Date   WBC 8.0 10/29/2017   HGB 15.5 10/29/2017   HCT 46.8 10/29/2017   MCV 107.2 (H) 10/29/2017   PLT 165 10/29/2017   PROTIME: Recent Labs    10/29/17 1037  LABPROT 12.8  INR 0.97   Chemistry No results for input(s): NA, K, CL, CO2, BUN, CREATININE, CALCIUM, PROT, BILITOT, ALKPHOS, ALT, AST, GLUCOSE in the last 168 hours.  Invalid input(s): LABALBU Lipids No results found for: CHOL, HDL, LDLCALC, TRIG BNP No  results found for: PROBNP Thyroid Function Tests: No results for input(s): TSH, T4TOTAL, T3FREE, THYROIDAB in the last 72 hours.  Invalid input(s): FREET3 Miscellaneous No results found for: DDIMER  Radiology/Studies:  Dg Chest 2 View  Result Date: 10/13/2017 CLINICAL DATA:  Shortness of breath, wheezing. EXAM: CHEST  2 VIEW COMPARISON:  Radiographs of April 05, 2017. FINDINGS: The heart size and mediastinal contours are within normal limits. Both lungs are clear. No pneumothorax or pleural effusion is noted. The visualized skeletal structures are unremarkable. IMPRESSION: No active cardiopulmonary disease. Electronically Signed  By: Marijo Conception, M.D.   On: 10/13/2017 10:53   Dg Fluoro Guided Needle Plc Aspiration/injection Loc  Result Date: 10/29/2017 CLINICAL DATA:  Impingement symptoms right shoulder. EXAM: Right SHOULDER INJECTION UNDER FLUOROSCOPY FLUOROSCOPY TIME:  Fluoroscopy Time:  1 minute 0 seconds Radiation Exposure Index (if provided by the fluoroscopic device): 9.7 mGy Number of Acquired Spot Images: 1 PROCEDURE: After discussing the risks and benefits of this procedure the patient informed consent was obtained. Right shoulder sterilely prepped and draped. Following local anesthesia 1% lidocaine 22 gauge spinal needle was advanced into the right shoulder joint and approximately 3 4 cc of nonionic contrast administered to confirm intra-articular location of needle tip. This was followed by administration of 125 mg of Solu-Medrol and 5 cc of Sensorcaine. IMPRESSION: Successful fluoroscopically guided right shoulder injection. Electronically Signed   By: Marcello Moores  Register   On: 10/29/2017 12:16    EKG: Atrial fibrillation at 110 Intervals-/0 8/32 Low voltage  Echo was reviewed and demonstrated normal wall thickness Assessment and Plan:  Atrial fibrillation-persistent  Presyncope question posttermination pauses/pauses  Dizziness rule out stroke  Alcohol intake is  exuberant  Sleep apnea-just treated   Priorities for the treatment of atrial fibrillation (#1 anticoagulation/stroke-prevention, #2 adequate rate control, and #3 considerations of rhythm control) were reviewed with questions answered. Risks, benefits, and alternatives to various diagnostic and treatment strategies were reviewed. Diagnostic considerations to exclude contributing factors such as thyroid disease and/ or obstructive sleep apnea, alcohol/hypertension/obesity were reviewed.  Treatment priorities with focus on assessment of thromboembolic risk (CHADS vs. CHADSvasc) and treatment options, adequate rate control (atrioventricular nodal blockade), and rhythm control considerations (based on symptom profile and antiarrhythmic candidacy) were reviewed including antiarrhythmic drugs with risk of proarrhythmia, atrial fibrillation ablation and permanent pacemaker implantation with atrioventricular node ablation (including discussion of risks and implications of permanent pacemaker and pacemaker dependent status).   Importance of optimal control of blood pressure, blood glucose, Obstructive Sleep Apnea, volume status   Additional discussion of possible need for upward titration of rate control meds if atrial fibrillation burden rises as well as potential use of antiarrhythmic drug medications with limitations/concerns noted based on underlying conduction system disease   Given dizziness, need to exclude a prior stroke for long-term management options related to anticoagulation  Given presyncope, pauses also need to be excluded.  30-day monitor.  Reviewing the Holter monitor previously utilized describing paroxysms of atrial fibrillation which I do not see, if no sinus is detected, cardioversion.  Paroxysms of sinus are noted, then given his age would have a low threshold for referral for catheter ablation  Stressed the direct relationship of alcohol intake and atrial fibrillation burden as well  as the importance of treating sleep apnea related to therapies for atrial fibrillation including ablation       Virl Axe

## 2017-11-03 ENCOUNTER — Telehealth: Payer: Self-pay | Admitting: Internal Medicine

## 2017-11-03 NOTE — Telephone Encounter (Signed)
I called and spoke with the patient's wife, Vickie regarding the an appointment for the patient's brain MRI. She is aware this has been scheduled for Thursday 11/12/17 at [facility] at 8:00 am- the patient will need to arrive about 10-15 minutes early to register in the [facility]- registration.   Pre-cert has been sent.

## 2017-11-04 ENCOUNTER — Ambulatory Visit
Admission: RE | Admit: 2017-11-04 | Discharge: 2017-11-04 | Disposition: A | Discharge status: Home / Self Care | Payer: PPO | Source: Ambulatory Visit | Attending: Neurosurgery | Admitting: Neurosurgery

## 2017-11-04 DIAGNOSIS — M545 Low back pain: Secondary | ICD-10-CM | POA: Diagnosis not present

## 2017-11-04 DIAGNOSIS — M47816 Spondylosis without myelopathy or radiculopathy, lumbar region: Secondary | ICD-10-CM | POA: Diagnosis not present

## 2017-11-04 DIAGNOSIS — M2578 Osteophyte, vertebrae: Secondary | ICD-10-CM | POA: Insufficient documentation

## 2017-11-04 DIAGNOSIS — M4316 Spondylolisthesis, lumbar region: Secondary | ICD-10-CM | POA: Insufficient documentation

## 2017-11-04 DIAGNOSIS — M48061 Spinal stenosis, lumbar region without neurogenic claudication: Secondary | ICD-10-CM | POA: Insufficient documentation

## 2017-11-04 DIAGNOSIS — M5126 Other intervertebral disc displacement, lumbar region: Secondary | ICD-10-CM | POA: Insufficient documentation

## 2017-11-09 ENCOUNTER — Telehealth: Payer: Self-pay | Admitting: Internal Medicine

## 2017-11-09 NOTE — Telephone Encounter (Signed)
Lmov to schedule  

## 2017-11-09 NOTE — Telephone Encounter (Signed)
-----   Message from Blain Pais sent at 11/05/2017 10:12 AM EST ----- l mom to schedule ----- Message ----- From: Emily Filbert, RN Sent: 11/05/2017   8:55 AM To: Emily Filbert, RN, [facility] Support Pool  Please call the patient to arrange a 5-6 week f/u with Dr. Caryl Comes (from 10/29/17 appt date).  He did not get to stop by check out the day he was here due to another appointment.   Thank you!!

## 2017-11-11 ENCOUNTER — Ambulatory Visit (INDEPENDENT_AMBULATORY_CARE_PROVIDER_SITE_OTHER): Payer: PPO

## 2017-11-11 DIAGNOSIS — I480 Paroxysmal atrial fibrillation: Secondary | ICD-10-CM | POA: Diagnosis not present

## 2017-11-11 DIAGNOSIS — I481 Persistent atrial fibrillation: Secondary | ICD-10-CM | POA: Diagnosis not present

## 2017-11-11 DIAGNOSIS — I4819 Other persistent atrial fibrillation: Secondary | ICD-10-CM

## 2017-11-12 ENCOUNTER — Ambulatory Visit
Admission: RE | Admit: 2017-11-12 | Discharge: 2017-11-12 | Disposition: A | Discharge status: Home / Self Care | Payer: PPO | Source: Ambulatory Visit | Attending: Internal Medicine | Admitting: Internal Medicine

## 2017-11-12 DIAGNOSIS — H81399 Other peripheral vertigo, unspecified ear: Secondary | ICD-10-CM

## 2017-11-24 DIAGNOSIS — Z6829 Body mass index (BMI) 29.0-29.9, adult: Secondary | ICD-10-CM | POA: Diagnosis not present

## 2017-11-24 DIAGNOSIS — M545 Low back pain: Secondary | ICD-10-CM | POA: Diagnosis not present

## 2017-11-25 DIAGNOSIS — G4733 Obstructive sleep apnea (adult) (pediatric): Secondary | ICD-10-CM | POA: Diagnosis not present

## 2017-11-29 ENCOUNTER — Telehealth: Payer: Self-pay | Admitting: Internal Medicine

## 2017-11-29 NOTE — Telephone Encounter (Addendum)
Cardiology [place] Note  Received call from [place]. Patient wearing heart monitor. Has known diagnosis of atrial fibrillation. Was found to have heart rate in the 160's. No symptoms reported. HR's improved to the 110's spontaneously.   Marcie Mowers, MD Cardiology Fellow, PGY-5

## 2017-11-30 DIAGNOSIS — M5416 Radiculopathy, lumbar region: Secondary | ICD-10-CM | POA: Diagnosis not present

## 2017-11-30 DIAGNOSIS — M48062 Spinal stenosis, lumbar region with neurogenic claudication: Secondary | ICD-10-CM | POA: Diagnosis not present

## 2017-11-30 DIAGNOSIS — M5136 Other intervertebral disc degeneration, lumbar region: Secondary | ICD-10-CM | POA: Diagnosis not present

## 2017-12-02 DIAGNOSIS — M545 Low back pain: Secondary | ICD-10-CM | POA: Diagnosis not present

## 2017-12-07 DIAGNOSIS — M48062 Spinal stenosis, lumbar region with neurogenic claudication: Secondary | ICD-10-CM | POA: Diagnosis not present

## 2017-12-07 DIAGNOSIS — M5416 Radiculopathy, lumbar region: Secondary | ICD-10-CM | POA: Diagnosis not present

## 2017-12-07 DIAGNOSIS — M5136 Other intervertebral disc degeneration, lumbar region: Secondary | ICD-10-CM | POA: Diagnosis not present

## 2017-12-10 ENCOUNTER — Ambulatory Visit: Payer: PPO | Admitting: Internal Medicine

## 2017-12-24 DIAGNOSIS — J019 Acute sinusitis, unspecified: Secondary | ICD-10-CM | POA: Diagnosis not present

## 2017-12-24 DIAGNOSIS — B9689 Other specified bacterial agents as the cause of diseases classified elsewhere: Secondary | ICD-10-CM | POA: Diagnosis not present

## 2017-12-29 DIAGNOSIS — G5761 Lesion of plantar nerve, right lower limb: Secondary | ICD-10-CM | POA: Diagnosis not present

## 2017-12-29 DIAGNOSIS — B356 Tinea cruris: Secondary | ICD-10-CM | POA: Diagnosis not present

## 2017-12-31 ENCOUNTER — Ambulatory Visit (INDEPENDENT_AMBULATORY_CARE_PROVIDER_SITE_OTHER): Payer: PPO | Admitting: Internal Medicine

## 2017-12-31 ENCOUNTER — Encounter: Payer: Self-pay | Admitting: Internal Medicine

## 2017-12-31 VITALS — BP 118/78 | HR 89 | Ht 72.0 in | Wt 218.0 lb

## 2017-12-31 DIAGNOSIS — I481 Persistent atrial fibrillation: Secondary | ICD-10-CM

## 2017-12-31 DIAGNOSIS — R55 Syncope and collapse: Secondary | ICD-10-CM | POA: Diagnosis not present

## 2017-12-31 DIAGNOSIS — I4819 Other persistent atrial fibrillation: Secondary | ICD-10-CM

## 2017-12-31 NOTE — Patient Instructions (Signed)
Medication Instructions: - Your physician recommends that you continue on your current medications as directed. Please refer to the Current Medication list given to you today.  Labwork: - lab work- pending the date of your Cardioversion  Procedures/Testing: - Your physician has recommended that you have a Cardioversion (DCCV). Electrical Cardioversion uses a jolt of electricity to your heart either through paddles or wired patches attached to your chest. This is a controlled, usually prescheduled, procedure. Defibrillation is done under light anesthesia in the hospital, and you usually go home the day of the procedure. This is done to get your heart back into a normal rhythm. You are not awake for the procedure.  You are scheduled for a Cardioversion on ________________ with Dr.___________ Please arrive at the [facility] at _________ a.m. on the day of your procedure.  DIET INSTRUCTIONS:  Nothing to eat or drink after midnight except your medications with a              sip of water.         1) Labs: __________________  2) Medications:  YOU MAY TAKE ALL of your remaining medications with a small amount of water.  3) Must have a responsible person to drive you home.  4) Bring a current list of your medications and current insurance cards.    If you have any questions after you get home, please call the office at [phone]  Follow-Up: - pending the date of your Cardioversion   Any Additional Special Instructions Will Be Listed Below (If Applicable).     If you need a refill on your cardiac medications before your next appointment, please call your pharmacy.     Electrical Cardioversion Electrical cardioversion is the delivery of a jolt of electricity to restore a normal rhythm to the heart. A rhythm that is too fast or is not regular keeps the heart from pumping well. In this procedure, sticky patches or metal paddles are placed on the chest to deliver electricity to  the heart from a device. This procedure may be done in an emergency if:  There is low or no blood pressure as a result of the heart rhythm.  Normal rhythm must be restored as fast as possible to protect the brain and heart from further damage.  It may save a life.  This procedure may also be done for irregular or fast heart rhythms that are not immediately life-threatening. Tell a health care provider about:  Any allergies you have.  All medicines you are taking, including vitamins, herbs, eye drops, creams, and over-the-counter medicines.  Any problems you or family members have had with anesthetic medicines.  Any blood disorders you have.  Any surgeries you have had.  Any medical conditions you have.  Whether you are pregnant or may be pregnant. What are the risks? Generally, this is a safe procedure. However, problems may occur, including:  Allergic reactions to medicines.  A blood clot that breaks free and travels to other parts of your body.  The possible return of an abnormal heart rhythm within hours or days after the procedure.  Your heart stopping (cardiac arrest). This is rare.  What happens before the procedure? Medicines  Your health care provider may have you start taking: ? Blood-thinning medicines (anticoagulants) so your blood does not clot as easily. ? Medicines may be given to help stabilize your heart rate and rhythm.  Ask your health care provider about changing or stopping your regular medicines. This  is especially important if you are taking diabetes medicines or blood thinners. General instructions  Plan to have someone take you home from the hospital or clinic.  If you will be going home right after the procedure, plan to have someone with you for 24 hours.  Follow instructions from your health care provider about eating or drinking restrictions. What happens during the procedure?  To lower your risk of infection: ? Your health care team  will wash or sanitize their hands. ? Your skin will be washed with soap.  An IV tube will be inserted into one of your veins.  You will be given a medicine to help you relax (sedative).  Sticky patches (electrodes) or metal paddles may be placed on your chest.  An electrical shock will be delivered. The procedure may vary among health care providers and hospitals. What happens after the procedure?  Your blood pressure, heart rate, breathing rate, and blood oxygen level will be monitored until the medicines you were given have worn off.  Do not drive for 24 hours if you were given a sedative.  Your heart rhythm will be watched to make sure it does not change. This information is not intended to replace advice given to you by your health care provider. Make sure you discuss any questions you have with your health care provider. Document Released: 08/22/2002 Document Revised: 04/30/2016 Document Reviewed: 03/07/2016 Elsevier Interactive Patient Education  2017 [employer].

## 2017-12-31 NOTE — Progress Notes (Signed)
Patient Care Team: Valera Castle, MD as PCP - General (Family Medicine) Bary Castilla Forest Gleason, MD (General Surgery)   HPI  Alexander Carey is a 67 y.o. male Seen in follow-up for atrial fibrillation accompanied by symptoms of dizziness.  Event recorder utilized to exclude posttermination pauses    His major complaint is fatigue.  He wonders whether this is related to the medications.  Less dizziness.  Exercise tolerance is limited both by his back as well as occasional palpitations.  Potential contributing factors include alcohol &  sleep apnea CPAP was recently started; he has not tolerated it.  DATE TEST EF   7/18 Echo   55-65 % LA size normal                Date Cr Hgb  7//18 0.72 15.6  2/19         Records and Results Reviewed   Past Medical History:  Diagnosis Date  . ED (erectile dysfunction)   . Enlarged prostate   . Hypogonadism in male   . Paroxysmal A-fib [facility])     Past Surgical History:  Procedure Laterality Date  . COLONOSCOPY  04/2005  . TONSILLECTOMY    . 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 amoxicillin-clavulanate (AUGMENTIN) 875-125 MG tablet Take by mouth 2 (two) times daily.  Marland Kitchen dutasteride (AVODART) 0.5 MG capsule Take 1 capsule (0.5 mg total) by mouth daily.  Marland Kitchen ELIQUIS 5 MG TABS tablet Take 5 mg by mouth 2 (two) times daily.   Marland Kitchen ipratropium (ATROVENT) 0.06 % nasal spray Place into the nose 3 (three) times daily.  . Ipratropium-Albuterol (COMBIVENT RESPIMAT) 20-100 MCG/ACT AERS respimat Inhale into the lungs.  Marland Kitchen ketoconazole (NIZORAL) 2 % cream 2 (two) times daily.  . metoprolol tartrate (LOPRESSOR) 25 MG tablet metoprolol tartrate 25 mg tablet one tablet twice a day.  . tamsulosin (FLOMAX) 0.4 MG CAPS  capsule Take 1 capsule (0.4 mg total) by mouth daily.  Marland Kitchen terbinafine (LAMISIL) 250 MG tablet daily.  . vardenafil (LEVITRA) 20 MG tablet Take 1 tablet (20 mg total) by mouth daily as needed for erectile dysfunction.    Allergies  Allergen Reactions  . Ciprofloxacin     Other reaction(s): Unknown  . Other       Review of Systems negative except from HPI and PMH  Physical Exam BP 118/78 (BP Location: Left Arm, Patient Position: Sitting, Cuff Size: Normal)   Pulse 89   Ht 6' (1.829 m)   Wt 218 lb (98.9 kg)   BMI 29.57 kg/m  Well developed and well nourished in no acute distress HENT normal E scleral and icterus clear Neck Supple JVP flat; carotids brisk and full Clear to ausculation Regular rate and rhythm, no murmurs gallops or rub Soft with active bowel sounds No clubbing cyanosis  Edema Alert and oriented, grossly normal motor and sensory function Skin Warm and Dry  ECG demonstrates atrial fibrillation at 89 Intervals-/08/36  Event recorder was reviewed and demonstrated atrial fibrillation for the duration with 43% of the time rates faster than 100.  There are no bradycardia arrhythmias.  Assessment and  Plan  Atrial fibrillation-persistent  Presyncope question posttermination pauses/pauses  Dizziness rule out stroke  Alcohol intake is exuberant  Sleep  apnea-   He is not working with the CPAP.  His atrial fibrillation is persistent.  He has fatigue.  I suspect that these are related.  Especially given the rapid rates of his atrial fibrillation, I suggested that we pursue cardioversion as our next step.  We have reviewed the likelihood that atrial fibrillation will revert we would like to answer to questions following cardioversion.  The first is can he tell a difference in symptoms in the second would be how long as he holds sinus rhythm.  We have reviewed taking his pulse of that he can help [place] with this.  The answers to these questions were informed  recommendations regarding antiarrhythmic therapy.  In this regard, we will have to reconsider therapy for his sleep apnea as the implications of untreated sleep apnea are so profound for the lack of benefit of atrial fibrillation.  We spent more than 50% of our >25 min visit in face to face counseling regarding the above     Current medicines are reviewed at length with the patient today .  The patient does not  have concerns regarding medicines.

## 2017-12-31 NOTE — H&P (View-Only) (Signed)
Patient Care Team: Valera Castle, MD as PCP - General (Family Medicine) Bary Castilla Forest Gleason, MD (General Surgery)   HPI  Alexander Carey is a 67 y.o. male Seen in follow-up for atrial fibrillation accompanied by symptoms of dizziness.  Event recorder utilized to exclude posttermination pauses    His major complaint is fatigue.  He wonders whether this is related to the medications.  Less dizziness.  Exercise tolerance is limited both by his back as well as occasional palpitations.  Potential contributing factors include alcohol &  sleep apnea CPAP was recently started; he has not tolerated it.  DATE TEST EF   7/18 Echo   55-65 % LA size normal                Date Cr Hgb  7//18 0.72 15.6  2/19         Records and Results Reviewed   Past Medical History:  Diagnosis Date  . ED (erectile dysfunction)   . Enlarged prostate   . Hypogonadism in male   . Paroxysmal A-fib [facility])     Past Surgical History:  Procedure Laterality Date  . COLONOSCOPY  04/2005  . TONSILLECTOMY    . 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;  Service: Orthopedics;  Laterality: Left;    Current Meds  Medication Sig  . amoxicillin-clavulanate (AUGMENTIN) 875-125 MG tablet Take by mouth 2 (two) times daily.  Marland Kitchen dutasteride (AVODART) 0.5 MG capsule Take 1 capsule (0.5 mg total) by mouth daily.  Marland Kitchen ELIQUIS 5 MG TABS tablet Take 5 mg by mouth 2 (two) times daily.   Marland Kitchen ipratropium (ATROVENT) 0.06 % nasal spray Place into the nose 3 (three) times daily.  . Ipratropium-Albuterol (COMBIVENT RESPIMAT) 20-100 MCG/ACT AERS respimat Inhale into the lungs.  Marland Kitchen ketoconazole (NIZORAL) 2 % cream 2 (two) times daily.  . metoprolol tartrate (LOPRESSOR) 25 MG tablet metoprolol tartrate 25 mg tablet one tablet twice a day.  . tamsulosin (FLOMAX) 0.4 MG CAPS  capsule Take 1 capsule (0.4 mg total) by mouth daily.  Marland Kitchen terbinafine (LAMISIL) 250 MG tablet daily.  . vardenafil (LEVITRA) 20 MG tablet Take 1 tablet (20 mg total) by mouth daily as needed for erectile dysfunction.    Allergies  Allergen Reactions  . Ciprofloxacin     Other reaction(s): Unknown  . Other       Review of Systems negative except from HPI and PMH  Physical Exam BP 118/78 (BP Location: Left Arm, Patient Position: Sitting, Cuff Size: Normal)   Pulse 89   Ht 6' (1.829 m)   Wt 218 lb (98.9 kg)   BMI 29.57 kg/m  Well developed and well nourished in no acute distress HENT normal E scleral and icterus clear Neck Supple JVP flat; carotids brisk and full Clear to ausculation Regular rate and rhythm, no murmurs gallops or rub Soft with active bowel sounds No clubbing cyanosis  Edema Alert and oriented, grossly normal motor and sensory function Skin Warm and Dry  ECG demonstrates atrial fibrillation at 89 Intervals-/08/36  Event recorder was reviewed and demonstrated atrial fibrillation for the duration with 43% of the time rates faster than 100.  There are no bradycardia arrhythmias.  Assessment and  Plan  Atrial fibrillation-persistent  Presyncope question posttermination pauses/pauses  Dizziness rule out stroke  Alcohol intake is exuberant  Sleep  apnea-   He is not working with the CPAP.  His atrial fibrillation is persistent.  He has fatigue.  I suspect that these are related.  Especially given the rapid rates of his atrial fibrillation, I suggested that we pursue cardioversion as our next step.  We have reviewed the likelihood that atrial fibrillation will revert we would like to answer to questions following cardioversion.  The first is can he tell a difference in symptoms in the second would be how long as he holds sinus rhythm.  We have reviewed taking his pulse of that he can help [place] with this.  The answers to these questions were informed  recommendations regarding antiarrhythmic therapy.  In this regard, we will have to reconsider therapy for his sleep apnea as the implications of untreated sleep apnea are so profound for the lack of benefit of atrial fibrillation.  We spent more than 50% of our >25 min visit in face to face counseling regarding the above     Current medicines are reviewed at length with the patient today .  The patient does not  have concerns regarding medicines.

## 2018-01-04 ENCOUNTER — Telehealth: Payer: Self-pay | Admitting: Internal Medicine

## 2018-01-04 DIAGNOSIS — I4819 Other persistent atrial fibrillation: Secondary | ICD-10-CM

## 2018-01-04 DIAGNOSIS — Z01812 Encounter for preprocedural laboratory examination: Secondary | ICD-10-CM

## 2018-01-04 NOTE — Telephone Encounter (Signed)
Called and s/w Vickie, patient's wife.  We discussed potential cardioversion dates. Patient can do 01/13/18 with Dr End, arrival at [facility] I wanted to check with Nira Conn, Dr Olin Pia nurse, to verify this would be appropriate for patient.  She was appreciative and aware we will call back to confirm and let them know when to get lab work (probably later this week.)  Called scheduling to schedule and left message for them to add the cardioversion. Message sent to pre-cert.

## 2018-01-04 NOTE — Telephone Encounter (Signed)
Pt wife calling to see if cardioversion has been schedule. Please call to discuss

## 2018-01-05 ENCOUNTER — Other Ambulatory Visit: Payer: Self-pay | Admitting: Internal Medicine

## 2018-01-05 DIAGNOSIS — M5416 Radiculopathy, lumbar region: Secondary | ICD-10-CM | POA: Diagnosis not present

## 2018-01-05 DIAGNOSIS — M5136 Other intervertebral disc degeneration, lumbar region: Secondary | ICD-10-CM | POA: Diagnosis not present

## 2018-01-05 DIAGNOSIS — M48062 Spinal stenosis, lumbar region with neurogenic claudication: Secondary | ICD-10-CM | POA: Diagnosis not present

## 2018-01-05 DIAGNOSIS — I4819 Other persistent atrial fibrillation: Secondary | ICD-10-CM

## 2018-01-05 NOTE — Telephone Encounter (Signed)
I called and spoke with the patient's wife (ok per DPR).  She is aware that the patient has been scheduled for his DCCV on Wed 01/13/18 at 7:30 am with Dr. Saunders Revel. DCCV instructions reviewed with the patient's wife. A written copy of the instructions were given in the office the day of his office visit as well. She is aware to have the patient go, no later than 4/29, for his lab work at the [facility].   An post procedure follow up appointment has been scheduled with Dr. Caryl Comes for Thursday 5/16 at 10:15 am.

## 2018-01-06 ENCOUNTER — Other Ambulatory Visit
Admission: RE | Admit: 2018-01-06 | Discharge: 2018-01-06 | Disposition: A | Discharge status: Home / Self Care | Payer: PPO | Source: Ambulatory Visit | Attending: Internal Medicine | Admitting: Internal Medicine

## 2018-01-06 DIAGNOSIS — I481 Persistent atrial fibrillation: Secondary | ICD-10-CM | POA: Insufficient documentation

## 2018-01-06 DIAGNOSIS — I4819 Other persistent atrial fibrillation: Secondary | ICD-10-CM

## 2018-01-06 DIAGNOSIS — Z01812 Encounter for preprocedural laboratory examination: Secondary | ICD-10-CM | POA: Diagnosis not present

## 2018-01-06 LAB — CBC WITH DIFFERENTIAL/PLATELET
Basophils Absolute: 0 10*3/uL (ref 0–0.1)
Basophils Relative: 0 %
EOS ABS: 0 10*3/uL (ref 0–0.7)
EOS PCT: 0 %
HCT: 42.6 % (ref 40.0–52.0)
Hemoglobin: 14.5 g/dL (ref 13.0–18.0)
LYMPHS ABS: 1.4 10*3/uL (ref 1.0–3.6)
Lymphocytes Relative: 12 %
MCH: 35.5 pg — ABNORMAL HIGH (ref 26.0–34.0)
MCHC: 34.1 g/dL (ref 32.0–36.0)
MCV: 104.1 fL — AB (ref 80.0–100.0)
MONO ABS: 0.8 10*3/uL (ref 0.2–1.0)
Monocytes Relative: 6 %
Neutro Abs: 10 10*3/uL — ABNORMAL HIGH (ref 1.4–6.5)
Neutrophils Relative %: 82 %
PLATELETS: 190 10*3/uL (ref 150–440)
RBC: 4.09 MIL/uL — AB (ref 4.40–5.90)
RDW: 13.1 % (ref 11.5–14.5)
WBC: 12.2 10*3/uL — AB (ref 3.8–10.6)

## 2018-01-06 LAB — BASIC METABOLIC PANEL
ANION GAP: 5 (ref 5–15)
BUN: 16 mg/dL (ref 6–20)
CALCIUM: 9.2 mg/dL (ref 8.9–10.3)
CO2: 26 mmol/L (ref 22–32)
Chloride: 103 mmol/L (ref 101–111)
Creatinine, Ser: 0.85 mg/dL (ref 0.61–1.24)
Glucose, Bld: 116 mg/dL — ABNORMAL HIGH (ref 65–99)
POTASSIUM: 4.6 mmol/L (ref 3.5–5.1)
Sodium: 134 mmol/L — ABNORMAL LOW (ref 135–145)

## 2018-01-13 ENCOUNTER — Ambulatory Visit
Admission: RE | Admit: 2018-01-13 | Discharge: 2018-01-13 | Disposition: A | Discharge status: Home / Self Care | Payer: PPO | Source: Ambulatory Visit | Attending: Internal Medicine | Admitting: Internal Medicine

## 2018-01-13 ENCOUNTER — Encounter
Admission: RE | Disposition: A | Discharge status: Home / Self Care | Payer: Self-pay | Source: Ambulatory Visit | Attending: Internal Medicine

## 2018-01-13 ENCOUNTER — Ambulatory Visit: Payer: PPO | Admitting: Anesthesiology

## 2018-01-13 ENCOUNTER — Encounter: Payer: Self-pay | Admitting: *Deleted

## 2018-01-13 DIAGNOSIS — I480 Paroxysmal atrial fibrillation: Secondary | ICD-10-CM | POA: Insufficient documentation

## 2018-01-13 DIAGNOSIS — J449 Chronic obstructive pulmonary disease, unspecified: Secondary | ICD-10-CM | POA: Diagnosis not present

## 2018-01-13 DIAGNOSIS — N529 Male erectile dysfunction, unspecified: Secondary | ICD-10-CM | POA: Diagnosis not present

## 2018-01-13 DIAGNOSIS — Z9989 Dependence on other enabling machines and devices: Secondary | ICD-10-CM | POA: Diagnosis not present

## 2018-01-13 DIAGNOSIS — I10 Essential (primary) hypertension: Secondary | ICD-10-CM | POA: Insufficient documentation

## 2018-01-13 DIAGNOSIS — Z7901 Long term (current) use of anticoagulants: Secondary | ICD-10-CM | POA: Diagnosis not present

## 2018-01-13 DIAGNOSIS — E291 Testicular hypofunction: Secondary | ICD-10-CM | POA: Diagnosis not present

## 2018-01-13 DIAGNOSIS — Z881 Allergy status to other antibiotic agents status: Secondary | ICD-10-CM | POA: Insufficient documentation

## 2018-01-13 DIAGNOSIS — Z79899 Other long term (current) drug therapy: Secondary | ICD-10-CM | POA: Insufficient documentation

## 2018-01-13 DIAGNOSIS — N400 Enlarged prostate without lower urinary tract symptoms: Secondary | ICD-10-CM | POA: Diagnosis not present

## 2018-01-13 DIAGNOSIS — E785 Hyperlipidemia, unspecified: Secondary | ICD-10-CM | POA: Diagnosis not present

## 2018-01-13 DIAGNOSIS — I4819 Other persistent atrial fibrillation: Secondary | ICD-10-CM

## 2018-01-13 DIAGNOSIS — Z87891 Personal history of nicotine dependence: Secondary | ICD-10-CM | POA: Diagnosis not present

## 2018-01-13 DIAGNOSIS — G4730 Sleep apnea, unspecified: Secondary | ICD-10-CM | POA: Insufficient documentation

## 2018-01-13 DIAGNOSIS — I481 Persistent atrial fibrillation: Secondary | ICD-10-CM | POA: Insufficient documentation

## 2018-01-13 DIAGNOSIS — I4891 Unspecified atrial fibrillation: Secondary | ICD-10-CM | POA: Diagnosis not present

## 2018-01-13 HISTORY — PX: CARDIOVERSION: EP1203

## 2018-01-13 SURGERY — CARDIOVERSION (CATH LAB)
Anesthesia: General

## 2018-01-13 MED ORDER — PROPOFOL 10 MG/ML IV BOLUS
INTRAVENOUS | Status: AC
  Filled 2018-01-13: qty 20

## 2018-01-13 MED ORDER — SODIUM CHLORIDE 0.9 % IV SOLN
INTRAVENOUS | Status: DC
  Administered 2018-01-13: 07:00:00 via INTRAVENOUS

## 2018-01-13 MED ORDER — APIXABAN 5 MG PO TABS
5.0000 mg | ORAL_TABLET | Freq: Once | ORAL | Status: AC
Start: 2018-01-13 — End: 2018-01-13
  Administered 2018-01-13: 5 mg via ORAL
  Filled 2018-01-13: qty 1

## 2018-01-13 MED ORDER — PROPOFOL 10 MG/ML IV BOLUS
INTRAVENOUS | Status: DC | PRN
  Administered 2018-01-13: 70 mg via INTRAVENOUS

## 2018-01-13 NOTE — CV Procedure (Signed)
    Cardioversion Note  RILAN EILAND [id number] 03/08/1951  Procedure: DC Cardioversion Indications: Persistent atrial fibrillation  Procedure Details Consent: Obtained Time Out: Verified patient identification, verified procedure, site/side was marked, verified correct patient position, special equipment/implants available, Radiology Safety Procedures followed,  medications/allergies/relevent history reviewed, required imaging and test results available.  Performed  The patient has been on adequate anticoagulation.  The patient received IV propofol for sedation by anesthesia services.  Synchronous cardioversion was performed at 120 joules without conversion.  A second synchronized shock was delivered at 200 joules.  The cardioversion was successful after second shock with restoration of normal sinus rhythm.   Complications: No apparent complications Patient did tolerate procedure well.   Nelva Bush., MD 01/13/2018, 7:50 AM

## 2018-01-13 NOTE — Anesthesia Postprocedure Evaluation (Signed)
Anesthesia Post Note  Patient: Alexander Carey  Procedure(s) Performed: CARDIOVERSION (N/A )  Patient location during evaluation: Other Anesthesia Type: General Level of consciousness: awake and alert Pain management: pain level controlled Vital Signs Assessment: post-procedure vital signs reviewed and stable Respiratory status: spontaneous breathing and respiratory function stable Cardiovascular status: stable Anesthetic complications: no     Last Vitals:  Vitals:   01/13/18 0750 01/13/18 0754  BP: 101/86 104/78  Pulse: 72 67  Resp: 19 18  Temp:    SpO2: 97% 98%    Last Pain:  Vitals:   01/13/18 0754  TempSrc:   PainSc: 0-No pain                 Trinty Marken K

## 2018-01-13 NOTE — Transfer of Care (Signed)
Immediate Anesthesia Transfer of Care Note  Patient: Alexander Carey  Procedure(s) Performed: CARDIOVERSION (N/A )  Patient Location: Short Stay  Anesthesia Type:General  Level of Consciousness: awake  Airway & Oxygen Therapy: Patient connected to nasal cannula oxygen  Post-op Assessment: Post -op Vital signs reviewed and stable  Post vital signs: stable  Last Vitals:  Vitals Value Taken Time  BP 101/86 01/13/2018  7:50 AM  Temp    Pulse 72 01/13/2018  7:50 AM  Resp 18 01/13/2018  7:50 AM  SpO2 98 % 01/13/2018  7:50 AM  Vitals shown include unvalidated device data.  Last Pain:  Vitals:   01/13/18 0647  TempSrc: Oral  PainSc: 0-No pain         Complications: No apparent anesthesia complications

## 2018-01-13 NOTE — Anesthesia Post-op Follow-up Note (Signed)
Anesthesia QCDR form completed.        

## 2018-01-13 NOTE — Interval H&P Note (Signed)
History and Physical Interval Note:  01/13/2018 7:10 AM  Alexander Carey  has presented today for cardioversion, with the diagnosis of persistent atrial fibrillation. The various methods of treatment have been discussed with the patient and family. After consideration of risks, benefits and other options for treatment, the patient has consented to  Procedure(s): CARDIOVERSION (N/A) as a surgical intervention .  The patient's history has been reviewed, patient examined, no change in status, stable for surgery.  I have reviewed the patient's chart and labs.  Questions were answered to the patient's satisfaction.     Valjean Ruppel

## 2018-01-13 NOTE — Anesthesia Preprocedure Evaluation (Signed)
Anesthesia Evaluation  Patient identified by MRN, date of birth, ID band Patient awake    Reviewed: Allergy & Precautions, NPO status , Patient's Chart, lab work & pertinent test results, reviewed documented beta blocker date and time   History of Anesthesia Complications Negative for: history of anesthetic complications  Airway Mallampati: II       Dental   Pulmonary neg sleep apnea,  COPD inhaler, former smoker,           Cardiovascular hypertension, Pt. on home beta blockers (-) Past MI and (-) CHF + dysrhythmias Atrial Fibrillation (-) Valvular Problems/Murmurs     Neuro/Psych neg Seizures    GI/Hepatic Neg liver ROS, neg GERD  ,  Endo/Other  neg diabetes  Renal/GU negative Renal ROS     Musculoskeletal   Abdominal   Peds  Hematology   Anesthesia Other Findings   Reproductive/Obstetrics                             Anesthesia Physical Anesthesia Plan  ASA: III  Anesthesia Plan: General   Post-op Pain Management:    Induction: Intravenous  PONV Risk Score and Plan: 2 and TIVA and Propofol infusion  Airway Management Planned: Nasal Cannula  Additional Equipment:   Intra-op Plan:   Post-operative Plan:   Informed Consent: I have reviewed the patients History and Physical, chart, labs and discussed the procedure including the risks, benefits and alternatives for the proposed anesthesia with the patient or authorized representative who has indicated his/her understanding and acceptance.     Plan Discussed with:   Anesthesia Plan Comments:         Anesthesia Quick Evaluation

## 2018-01-14 ENCOUNTER — Encounter: Payer: Self-pay | Admitting: Internal Medicine

## 2018-01-14 ENCOUNTER — Telehealth: Payer: Self-pay | Admitting: Internal Medicine

## 2018-01-14 NOTE — Telephone Encounter (Signed)
Pt wife states pt has a rash, where the "rectangle was on the center of his back and on his chest where the patch was under his left breast". Please call to discuss.

## 2018-01-14 NOTE — Telephone Encounter (Signed)
S/w patient's wife. There are little bumps, itchiness and redness where the pads were during cardioversion. S/w Christell Faith, PA who advised patient to use hydrocortisone cream on the area. She verbalized understanding and will let us know if no improvement.

## 2018-01-20 ENCOUNTER — Ambulatory Visit (INDEPENDENT_AMBULATORY_CARE_PROVIDER_SITE_OTHER): Payer: PPO | Admitting: *Deleted

## 2018-01-20 VITALS — HR 74 | Resp 19 | Ht 72.0 in | Wt 212.8 lb

## 2018-01-20 DIAGNOSIS — I481 Persistent atrial fibrillation: Secondary | ICD-10-CM | POA: Diagnosis not present

## 2018-01-20 DIAGNOSIS — I4819 Other persistent atrial fibrillation: Secondary | ICD-10-CM

## 2018-01-20 NOTE — Progress Notes (Signed)
1.) Reason for visit: Post cardioversion EKG  2.) Name of MD requesting visit: Dr. Saunders Revel  3.) H&P: Afib, dyslipidemia  4.) ROS related to problem: Patient had cardioversion for afib on 01/13/18. He denies any symptoms and states that he feels like he has more energy.   5.) Assessment and plan per MD: Dr. Saunders Revel reviewed EKG and it shows Atrial Fibrillation with rate of 74. Patient does have appointment to see Dr. Caryl Comes tomorrow. Confirmed patient is still on his Eliquis and other medications reviewed as well.

## 2018-01-20 NOTE — Addendum Note (Signed)
Addended by: Valora Corporal on: 01/20/2018 02:22 PM   Modules accepted: Orders

## 2018-01-21 ENCOUNTER — Encounter: Payer: Self-pay | Admitting: Internal Medicine

## 2018-01-21 ENCOUNTER — Ambulatory Visit: Payer: PPO | Admitting: Internal Medicine

## 2018-01-21 VITALS — BP 110/70 | HR 74 | Ht 72.0 in | Wt 210.0 lb

## 2018-01-21 DIAGNOSIS — I4819 Other persistent atrial fibrillation: Secondary | ICD-10-CM

## 2018-01-21 DIAGNOSIS — I481 Persistent atrial fibrillation: Secondary | ICD-10-CM

## 2018-01-21 MED ORDER — FLECAINIDE ACETATE 100 MG PO TABS: 100.0000 mg | ORAL_TABLET | Freq: Two times a day (BID) | ORAL | 6 refills | Status: DC

## 2018-01-21 NOTE — Progress Notes (Signed)
Patient Care Team: Valera Castle, MD as PCP - General (Family Medicine) Bary Castilla Forest Gleason, MD (General Surgery)   HPI  Alexander Carey is a 67 y.o. male Seen in follow-up for atrial fibrillation accompanied by symptoms of dizziness.  Event recorder utilized to exclude posttermination pauses   His major complaint has been fatigue and the question is been whether it was related to his atrial fibrillation.  He underwent cardioversion 01/13/2018.  Unfortunately repeat ECG 01/20/2018 demonstrated atrial fibrillation However it was notable for both him and his wife how much better he felt in sinus rhythm; less fatigue and less shortness of breath and no dizziness  No bleeding issues on Eliquis.   Potential contributing factors include alcohol &  sleep apnea CPAP was recently started; he has not tolerated it.  DATE TEST EF   7/18 Echo   55-65 % LA size normal                Date Cr Hgb  7//18 0.72 15.6  4/19 0.85 14.6   DATE PR interval QRSduration Dose  5//19  na 74 0            Records and Results Reviewed   Past Medical History:  Diagnosis Date  . ED (erectile dysfunction)   . Enlarged prostate   . Hypogonadism in male   . Paroxysmal A-fib [facility])     Past Surgical History:  Procedure Laterality Date  . CARDIOVERSION N/A 01/13/2018   Procedure: CARDIOVERSION;  Surgeon: Nelva Bush, MD;  Location: [facility] ORS;  Service: Cardiovascular;  Laterality: N/A;  . COLONOSCOPY  04/2005  . TONSILLECTOMY    . 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 dutasteride (AVODART) 0.5 MG capsule Take 1 capsule (0.5 mg total) by mouth daily.  Marland Kitchen ELIQUIS 5 MG TABS tablet Take 5 mg by mouth 2 (two) times daily.   . Ipratropium-Albuterol (COMBIVENT  RESPIMAT) 20-100 MCG/ACT AERS respimat Inhale 1 puff into the lungs every 6 (six) hours as needed for wheezing or shortness of breath.   . metoprolol tartrate (LOPRESSOR) 25 MG tablet Take 25 mg by mouth twice daily  . tamsulosin (FLOMAX) 0.4 MG CAPS capsule Take 1 capsule (0.4 mg total) by mouth daily.    Allergies  Allergen Reactions  . Ciprofloxacin Other (See Comments)    Unknown      Review of Systems negative except from HPI and PMH  Physical Exam BP 110/70   Pulse 74   Ht 6' (1.829 m)   Wt 210 lb (95.3 kg)   SpO2 98%   BMI 28.48 kg/m  Well developed and nourished in no acute distress HENT normal Neck supple with JVP-flat Carotids brisk and full without bruits Clear Irregularly irregular rate and rhythm with controlled ventricular response, no murmurs or gallops Abd-soft with active BS without hepatomegaly No Clubbing cyanosis edema Skin-warm and dry A & Oriented  Grossly normal sensory and motor function  ECG demonstrates afib at 74 /-07/39     Assessment and  Plan  Atrial fibrillation-persistent  Presyncope question posttermination pauses/pauses  Dizziness rule out stroke  Alcohol intake is exuberant  Sleep apnea-   Given the significant improvement in symptoms following cardioversion, we will undertake antiarrhythmic drug facilitated cardioversion.  He has no known coronary disease.  We discussed potential proarrhythmia and will use flecainide 100 mg twice daily.  In the event that this is unsuccessful, we would consider catheter ablation; however, prior to pursuing that, we would need to address his sleep apnea.  He is also encouraged to decrease alcohol intake  We spent more than 50% of our >25 min visit in face to face counseling regarding the above     Current medicines are reviewed at length with the patient today .  The patient does not  have concerns regarding medicines.

## 2018-01-21 NOTE — Patient Instructions (Addendum)
Medication Instructions: - Your physician has recommended you make the following change in your medication:   1) START flecainide 100 mg- take 1 tablet by mouth TWICE daily  Labwork: -   Procedures/Testing: - Your physician has recommended that you have a Cardioversion (DCCV). Electrical Cardioversion uses a jolt of electricity to your heart either through paddles or wired patches attached to your chest. This is a controlled, usually prescheduled, procedure. Defibrillation is done under light anesthesia in the hospital, and you usually go home the day of the procedure. This is done to get your heart back into a normal rhythm. You are not awake for the procedure.   You are scheduled for a Cardioversion on ___Tuesday 5/14/19_____________ with Dr.__Gollan_________ Please arrive at the [facility] at __6:30 am______ on the day of your procedure.  DIET INSTRUCTIONS:  Nothing to eat or drink after midnight except your medications with a              sip of water.         1) Labs: ___n/a_______________  2) Medications:  YOU MAY TAKE ALL of your regular medications with a small amount of water the morning of your procedure  3) Must have a responsible person to drive you home.  4) Bring a current list of your medications and current insurance cards.    If you have any questions after you get home, please call the office at [phone]   - Your physician has requested that you have an exercise tolerance test- Thursday 01/28/18 (when Dr. Caryl Comes is in the office)  - you may eat a light breakfast/ lunch prior to your procedure - no caffeine for 24 hours prior to your test (coffee, tea, soft drinks, or chocolate)  - no smoking/ vaping for 4 hours prior to your test - you may take your regular medications the day of your test except for - bring any inhalers with you to your test - wear comfortable clothing & tennis/ non-skid shoes to walk on the treadmill  Follow-Up: - Your physician  recommends that you schedule a follow-up appointment in: 4 weeks (from 01/26/18) with Dr. Caryl Comes.   Any Additional Special Instructions Will Be Listed Below (If Applicable).     If you need a refill on your cardiac medications before your next appointment, please call your pharmacy.

## 2018-01-25 ENCOUNTER — Other Ambulatory Visit: Payer: Self-pay | Admitting: Cardiovascular Disease

## 2018-01-26 ENCOUNTER — Encounter
Admission: RE | Disposition: A | Discharge status: Home / Self Care | Payer: Self-pay | Source: Ambulatory Visit | Attending: Cardiovascular Disease

## 2018-01-26 ENCOUNTER — Encounter: Payer: Self-pay | Admitting: Anesthesiology

## 2018-01-26 ENCOUNTER — Ambulatory Visit
Admission: RE | Admit: 2018-01-26 | Discharge: 2018-01-26 | Disposition: A | Discharge status: Home / Self Care | Payer: PPO | Source: Ambulatory Visit | Attending: Cardiovascular Disease | Admitting: Cardiovascular Disease

## 2018-01-26 ENCOUNTER — Ambulatory Visit: Payer: PPO | Admitting: Anesthesiology

## 2018-01-26 DIAGNOSIS — Z87891 Personal history of nicotine dependence: Secondary | ICD-10-CM | POA: Insufficient documentation

## 2018-01-26 DIAGNOSIS — R42 Dizziness and giddiness: Secondary | ICD-10-CM | POA: Diagnosis not present

## 2018-01-26 DIAGNOSIS — G4730 Sleep apnea, unspecified: Secondary | ICD-10-CM | POA: Insufficient documentation

## 2018-01-26 DIAGNOSIS — I4891 Unspecified atrial fibrillation: Secondary | ICD-10-CM | POA: Diagnosis not present

## 2018-01-26 DIAGNOSIS — I480 Paroxysmal atrial fibrillation: Secondary | ICD-10-CM | POA: Insufficient documentation

## 2018-01-26 DIAGNOSIS — I481 Persistent atrial fibrillation: Secondary | ICD-10-CM | POA: Diagnosis not present

## 2018-01-26 DIAGNOSIS — M1990 Unspecified osteoarthritis, unspecified site: Secondary | ICD-10-CM | POA: Diagnosis not present

## 2018-01-26 HISTORY — PX: CARDIOVERSION: EP1203

## 2018-01-26 SURGERY — CARDIOVERSION (CATH LAB)
Anesthesia: General

## 2018-01-26 MED ORDER — PROPOFOL 10 MG/ML IV BOLUS
INTRAVENOUS | Status: DC | PRN
  Administered 2018-01-26: 60 mg via INTRAVENOUS
  Administered 2018-01-26: 10 mg via INTRAVENOUS

## 2018-01-26 MED ORDER — PROPOFOL 10 MG/ML IV BOLUS
INTRAVENOUS | Status: AC
  Filled 2018-01-26: qty 20

## 2018-01-26 NOTE — Anesthesia Procedure Notes (Signed)
Performed by: Rheda Kassab, CRNA Pre-anesthesia Checklist: Patient identified, Emergency Drugs available, Suction available, Patient being monitored and Timeout performed Patient Re-evaluated:Patient Re-evaluated prior to induction Oxygen Delivery Method: Nasal cannula Induction Type: IV induction       

## 2018-01-26 NOTE — CV Procedure (Signed)
Cardioversion procedure note For atrial fibrillation, persistent  Procedure Details:  Consent: Risks of procedure as well as the alternatives and risks of each were explained to the (patient/caregiver).  Consent for procedure obtained.  Time Out: Verified patient identification, verified procedure, site/side was marked, verified correct patient position, special equipment/implants available, medications/allergies/relevent history reviewed, required imaging and test results available.  Performed  Patient placed on cardiac monitor, pulse oximetry, supplemental oxygen as necessary.   Sedation given: propofol IV, Dr. Piscitello Pacer pads placed anterior and posterior chest.   Cardioverted 1 time(s).   Cardioverted at 150 J. Synchronized biphasic Converted to NSR   Evaluation: Findings: Post procedure EKG shows: NSR Complications: None Patient did tolerate procedure well.  Time Spent Directly with the Patient:  45 minutes   Tim Rjay Revolorio, M.D., Ph.D.  

## 2018-01-26 NOTE — Anesthesia Postprocedure Evaluation (Signed)
Anesthesia Post Note  Patient: Alexander Carey  Procedure(s) Performed: CARDIOVERSION (N/A )  Patient location during evaluation: PACU Anesthesia Type: General Level of consciousness: awake and alert Pain management: pain level controlled Vital Signs Assessment: post-procedure vital signs reviewed and stable Respiratory status: spontaneous breathing, nonlabored ventilation, respiratory function stable and patient connected to nasal cannula oxygen Cardiovascular status: blood pressure returned to baseline and stable Postop Assessment: no apparent nausea or vomiting Anesthetic complications: no     Last Vitals:  Vitals:   01/26/18 0745 01/26/18 0800  BP: 92/67 95/74  Pulse: 60 (!) 57  Resp: 14 13  Temp:    SpO2: 99% 98%    Last Pain:  Vitals:   01/26/18 0800  TempSrc:   PainSc: 0-No pain                 Precious Haws Marthena Whitmyer

## 2018-01-26 NOTE — Transfer of Care (Signed)
Immediate Anesthesia Transfer of Care Note  Patient: Alexander Carey  Procedure(s) Performed: CARDIOVERSION (N/A )  Patient Location: PACU  Anesthesia Type:General  Level of Consciousness: awake, alert  and oriented  Airway & Oxygen Therapy: Patient Spontanous Breathing and Patient connected to nasal cannula oxygen  Post-op Assessment: Report given to RN and Post -op Vital signs reviewed and stable  Post vital signs: Reviewed and stable  Last Vitals:  Vitals Value Taken Time  BP 114/89 01/26/2018  7:38 AM  Temp    Pulse 69 01/26/2018  7:38 AM  Resp 23 01/26/2018  7:38 AM  SpO2 99 % 01/26/2018  7:38 AM    Last Pain:  Vitals:   01/26/18 0648  TempSrc: Oral  PainSc: 0-No pain         Complications: No apparent anesthesia complications

## 2018-01-26 NOTE — Anesthesia Post-op Follow-up Note (Signed)
Anesthesia QCDR form completed.        

## 2018-01-26 NOTE — Anesthesia Preprocedure Evaluation (Signed)
Anesthesia Evaluation  Patient identified by MRN, date of birth, ID band Patient awake    Reviewed: Allergy & Precautions, H&P , NPO status , Patient's Chart, lab work & pertinent test results  History of Anesthesia Complications Negative for: history of anesthetic complications  Airway Mallampati: III  TM Distance: <3 FB Neck ROM: limited    Dental  (+) Chipped, Poor Dentition   Pulmonary neg shortness of breath, former smoker,           Cardiovascular Exercise Tolerance: Good (-) angina(-) Past MI and (-) DOE negative cardio ROS       Neuro/Psych  Neuromuscular disease negative psych ROS   GI/Hepatic negative GI ROS, Neg liver ROS, neg GERD  ,  Endo/Other  negative endocrine ROS  Renal/GU negative Renal ROS  negative genitourinary   Musculoskeletal  (+) Arthritis ,   Abdominal   Peds  Hematology negative hematology ROS (+)   Anesthesia Other Findings Past Medical History: No date: ED (erectile dysfunction) No date: Enlarged prostate No date: Hypogonadism in male No date: Paroxysmal A-fib [facility])  Past Surgical History: 01/13/2018: CARDIOVERSION; N/A     Comment:  Procedure: CARDIOVERSION;  Surgeon: Nelva Bush,               MD;  Location: [facility] ORS;  Service: Cardiovascular;                Laterality: N/A; 04/2005: COLONOSCOPY No date: TONSILLECTOMY 12/03/2016: TRIGGER FINGER RELEASE; Left     Comment:  Procedure: RELEASE / EXCISION OF THE DUPUYTRENS               CONTRACTURE OF LEFT LITTLE FINGER;  Surgeon: Corky Mull, MD;  Location: [place];  Service:               Orthopedics;  Laterality: Left;     Reproductive/Obstetrics negative OB ROS                             Anesthesia Physical Anesthesia Plan  ASA: III  Anesthesia Plan: General   Post-op Pain Management:    Induction: Intravenous  PONV Risk Score and Plan: Propofol infusion  and TIVA  Airway Management Planned: Natural Airway and Nasal Cannula  Additional Equipment:   Intra-op Plan:   Post-operative Plan:   Informed Consent: I have reviewed the patients History and Physical, chart, labs and discussed the procedure including the risks, benefits and alternatives for the proposed anesthesia with the patient or authorized representative who has indicated his/her understanding and acceptance.   Dental Advisory Given  Plan Discussed with: Anesthesiologist, CRNA and Surgeon  Anesthesia Plan Comments: (Patient consented for risks of anesthesia including but not limited to:  - adverse reactions to medications - risk of intubation if required - damage to teeth, lips or other oral mucosa - sore throat or hoarseness - Damage to heart, brain, lungs or loss of life  Patient voiced understanding.)        Anesthesia Quick Evaluation

## 2018-01-28 ENCOUNTER — Ambulatory Visit: Payer: PPO | Admitting: Internal Medicine

## 2018-01-28 ENCOUNTER — Ambulatory Visit (INDEPENDENT_AMBULATORY_CARE_PROVIDER_SITE_OTHER): Payer: PPO

## 2018-01-28 DIAGNOSIS — R0609 Other forms of dyspnea: Principal | ICD-10-CM

## 2018-01-28 DIAGNOSIS — I4819 Other persistent atrial fibrillation: Secondary | ICD-10-CM

## 2018-01-28 DIAGNOSIS — I481 Persistent atrial fibrillation: Secondary | ICD-10-CM

## 2018-01-28 NOTE — Patient Instructions (Addendum)
Medication Instructions: - Your physician recommends that you continue on your current medications as directed. Please refer to the Current Medication list given to you today.  Labwork: - none ordered  Procedures/Testing: - Your physician has recommended that you have a Cardiac CTA to assess for any possible blockages in your heart  * A scheduler from our [place] office will be in touch with you to arrange an appointment *  Follow-Up: - as scheduled with Dr. Caryl Comes on 02/25/18  Any Additional Special Instructions Will Be Listed Below (If Applicable).     If you need a refill on your cardiac medications before your next appointment, please call your pharmacy.      Please arrive at the [facility] main entrance of [facility] at _____________ AM (30-45 minutes prior to test start time)  [facility] [place], [place] [address] [phone]  Proceed to the [facility] Radiology Department (First Floor).  Please follow these instructions carefully (unless otherwise directed):  Hold all erectile dysfunction medications at least 48 hours prior to test.  On the Night Before the Test: . Drink plenty of water. . Do not consume any caffeinated/decaffeinated beverages or chocolate 12 hours prior to your test. . Do not take any antihistamines 12 hours prior to your test.   On the Day of the Test: . Drink plenty of water. Do not drink any water within one hour of the test. . Do not eat any food 4 hours prior to the test. . You may take your regular medications prior to the test (make sure to take your metoprolol that morning   After the Test: . Drink plenty of water. . After receiving IV contrast, you may experience a mild flushed feeling. This is normal. . On occasion, you may experience a mild rash up to 24 hours after the test. This is not dangerous. If this occurs, you can take Benadryl 25 mg and increase your fluid intake. . If you  experience trouble breathing, this can be serious. If it is severe call 911 IMMEDIATELY. If it is mild, please call our office. . If you take any of these medications: Glipizide/Metformin, Avandament, Glucavance, please do not take 48 hours after completing test.

## 2018-01-29 NOTE — H&P (Signed)
H&P Addendum, pre-cardioversion  Patient was seen and evaluated prior to -cardioversion procedure Symptoms, prior testing details again confirmed with the patient Patient examined, no significant change from prior exam Lab work reviewed in detail personally by myself Patient understands risk and benefit of the procedure, willing to proceed  Signed, Tim Dwan Hemmelgarn, MD, Ph.D [facility]  

## 2018-02-02 DIAGNOSIS — M5136 Other intervertebral disc degeneration, lumbar region: Secondary | ICD-10-CM | POA: Diagnosis not present

## 2018-02-02 DIAGNOSIS — M48062 Spinal stenosis, lumbar region with neurogenic claudication: Secondary | ICD-10-CM | POA: Diagnosis not present

## 2018-02-02 DIAGNOSIS — M5416 Radiculopathy, lumbar region: Secondary | ICD-10-CM | POA: Diagnosis not present

## 2018-02-10 ENCOUNTER — Telehealth: Payer: Self-pay | Admitting: Internal Medicine

## 2018-02-10 NOTE — Telephone Encounter (Signed)
Pt wife states Dr. Francena Hanly was going to recommend a dentist in [place] that will accomodate someone who cant use a sleep apnea machine. Please call to discuss

## 2018-02-10 NOTE — Telephone Encounter (Signed)
Dr Caryl Comes will be in office tomorrow. Will ask him at that time.

## 2018-02-11 ENCOUNTER — Ambulatory Visit (HOSPITAL_COMMUNITY)
Admission: RE | Admit: 2018-02-11 | Discharge: 2018-02-11 | Disposition: A | Discharge status: Home / Self Care | Payer: PPO | Source: Ambulatory Visit | Attending: Cardiovascular Disease | Admitting: Cardiovascular Disease

## 2018-02-11 ENCOUNTER — Ambulatory Visit: Payer: PPO | Admitting: Internal Medicine

## 2018-02-11 ENCOUNTER — Encounter (HOSPITAL_COMMUNITY): Payer: Self-pay

## 2018-02-11 ENCOUNTER — Telehealth: Payer: Self-pay | Admitting: Internal Medicine

## 2018-02-11 ENCOUNTER — Ambulatory Visit (HOSPITAL_COMMUNITY): Payer: PPO

## 2018-02-11 DIAGNOSIS — R0609 Other forms of dyspnea: Principal | ICD-10-CM

## 2018-02-11 NOTE — Telephone Encounter (Signed)
Will forward to Dr. Caryl Comes to see if he ever heard back from Dr. Ron Parker.

## 2018-02-11 NOTE — Progress Notes (Signed)
Patient here for CT of heart. Heart rate 90-120s in afib. BP 102/73. Dr. Meda Coffee informed of vital signs. Unable to go forward with scan per Dr. Meda Coffee due to heart rate being too high. Dr. Meda Coffee will get in contact with cardiologist that ordered exam. Situation explained to both patient and wife, they both state understanding without any further questions.

## 2018-02-11 NOTE — Telephone Encounter (Signed)
Patient could not have CT completed due to being in A-fib Would like to speak with nurse and see if patient should come in sooner Also the new medication that patient has been placed on has been making his vision fuzzy Please call to discuss

## 2018-02-12 NOTE — Telephone Encounter (Signed)
I called and spoke with the patient's wife.  1) They had called on 5/29 to follow up with Dr. Caryl Comes if he had gotten any feedback from Dr. Oneal Grout regarding someone to see for an oral appliance for sleep apnea since he cannot tolerate C-PAP. Per Dr. Caryl Comes, Dr. Ron Parker did not know of anyone in the [place] area, but had heard of [facility] dental and suggested the patient check there. I inquired from Dr. Caryl Comes, if the patient was willing to go to [facility], could he see Dr. Ron Parker. The patient's wife is aware that per my last correspondence with Dr. Caryl Comes, Dr. Caryl Comes was checking with Dr. Ron Parker to see if they would accept the patient's insurance.   2) The patient was scheduled Cardiac CT on 5/30, but this was unable to be performed due to the patient being in a-fib. Per the patient's wife, she thinks the patient has been back in a-fib for about a week as she was able to feel that his pulse was irregular.  They do not have a way to check his BP/ HR at home. Per Mrs. Yandow, the patient's HR's yesterday while he was being hooked up for the CT, ranged from 75-120 bpm. His BP was 105/60. He is currently on metoprolol tartrate 25 mg BID & flecainide 100 mg BID.  The patient is also reporting that his vision has been "fuzzy" since the flecainide was started.   I have advised the patient's wife that I will review with Dr. Caryl Comes to see if we can move up his appointment with Dr. Caryl Comes to next week. I will call her back and let her know what we can do.  She is agreeable and voices understanding.

## 2018-02-12 NOTE — Telephone Encounter (Signed)
I spoke with Dr. Caryl Comes- ok to add on the patient for Thursday 6/6 at 8:45 am.  The patient's wife is aware and agreeable.

## 2018-02-12 NOTE — Telephone Encounter (Signed)
I spoke with the patient's wife today regarding multiple issues- another phone encounter is open for 5/30- I will close this encounter and address on the 5/30 phone note.

## 2018-02-17 DIAGNOSIS — M4807 Spinal stenosis, lumbosacral region: Secondary | ICD-10-CM | POA: Diagnosis not present

## 2018-02-17 DIAGNOSIS — M545 Low back pain: Secondary | ICD-10-CM | POA: Diagnosis not present

## 2018-02-18 ENCOUNTER — Encounter: Payer: Self-pay | Admitting: Internal Medicine

## 2018-02-18 ENCOUNTER — Ambulatory Visit (INDEPENDENT_AMBULATORY_CARE_PROVIDER_SITE_OTHER): Payer: PPO | Admitting: Internal Medicine

## 2018-02-18 ENCOUNTER — Telehealth: Payer: Self-pay | Admitting: Internal Medicine

## 2018-02-18 VITALS — BP 114/80 | HR 89 | Ht 72.0 in | Wt 210.8 lb

## 2018-02-18 DIAGNOSIS — I4819 Other persistent atrial fibrillation: Secondary | ICD-10-CM

## 2018-02-18 DIAGNOSIS — I481 Persistent atrial fibrillation: Secondary | ICD-10-CM

## 2018-02-18 MED ORDER — PROPAFENONE HCL ER 325 MG PO CP12: 325.0000 mg | ORAL_CAPSULE | Freq: Two times a day (BID) | ORAL | 6 refills | Status: DC

## 2018-02-18 MED ORDER — PROPAFENONE HCL 300 MG PO TABS: 300.0000 mg | ORAL_TABLET | Freq: Three times a day (TID) | ORAL | 6 refills | Status: DC

## 2018-02-18 NOTE — Telephone Encounter (Signed)
Pt wife called states they filled the rx that was extended release.

## 2018-02-18 NOTE — Patient Instructions (Addendum)
Medication Instructions: - Your physician has recommended you make the following change in your medication:   1) STOP flecainide  2) You are being given 2 prescriptions for Rhythmol (propafenone) to price at your pharmacy.  (preferred is)- Rhythmol SR (propafenone) 325 mg- take 1 capsule by mouth TWICE daily  (if this is too expensive, then)- Rhythmol (propafenone) 300 mg- take 1 tablet by mouth TWICE daily  Labwork: - none ordered  Procedures/Testing: - Your physician has recommended that you have a Cardioversion (DCCV). Electrical Cardioversion uses a jolt of electricity to your heart either through paddles or wired patches attached to your chest. This is a controlled, usually prescheduled, procedure. Defibrillation is done under light anesthesia in the hospital, and you usually go home the day of the procedure. This is done to get your heart back into a normal rhythm. You are not awake for the procedure. Please see the instruction sheet given to you today.  You are scheduled for a Cardioversion on Monday 03/01/18 with Dr. Rockey Situ Please arrive at the [facility] at 6:30 a.m. on the day of your procedure.  DIET INSTRUCTIONS:  Nothing to eat or drink after midnight except your medications with a              sip of water.         1) Labs: ____n/a_________  2) Medications:  YOU MAY TAKE ALL of your  medications with a small amount of water the morning of your procedure  3) Must have a responsible person to drive you home.  4) Bring a current list of your medications and current insurance cards.    If you have any questions after you get home, please call the office at [phone]   Follow-Up: - Your physician recommends that you schedule a follow-up appointment in: 8-10 weeks with  Dr. Caryl Comes.   Any Additional Special Instructions Will Be Listed Below (If Applicable). - follow up with [facility] regarding an oral appliance    If you need a refill on your cardiac  medications before your next appointment, please call your pharmacy.

## 2018-02-18 NOTE — Telephone Encounter (Signed)
I called and spoke with the patient's wife. She states the patient had his sleep study at home, but this was ordered by Dr.Parachos. I have advised her to call his office for the report. She voices understanding.   Confirmed the patient filled ER rhythmol.

## 2018-02-18 NOTE — Telephone Encounter (Signed)
Patient wife Vickie calling  States they have found a dentist but they are requesting a copy of a sleep study Please call patient to see if we can get this information

## 2018-02-18 NOTE — Progress Notes (Signed)
Patient Care Team: Valera Castle, MD as PCP - General (Family Medicine) Bary Castilla Forest Gleason, MD (General Surgery)   HPI  Alexander Carey is a 67 y.o. male Seen in follow-up for atrial fibrillation accompanied by symptoms of dizziness.  Event recorder utilized to exclude posttermination pauses    He was started on flecainide and calls suggested concern with fogginess  He underwent cardioversion and felt better thereafter prior to reverting to atrial fibrillation.  This is bothersome with energy and shortness of breath as well as some dizziness.  No bleeding issues.  Also been working on a place for oral appliance for OSA      Potential contributing factors include alcohol &  sleep apnea CPAP was recently started; he has not tolerated it.  DATE TEST EF   7/18 Echo   55-65 % LA size normal                Date Cr Hgb  7//18 0.72 15.6  4/19 0.85 14.5     Records and Results Reviewed   Past Medical History:  Diagnosis Date  . ED (erectile dysfunction)   . Enlarged prostate   . Hypogonadism in male   . Paroxysmal A-fib [facility])     Past Surgical History:  Procedure Laterality Date  . CARDIOVERSION N/A 01/13/2018   Procedure: CARDIOVERSION;  Surgeon: Nelva Bush, MD;  Location: [facility] ORS;  Service: Cardiovascular;  Laterality: N/A;  . CARDIOVERSION N/A 01/26/2018   Procedure: CARDIOVERSION;  Surgeon: Minna Merritts, MD;  Location: [facility] ORS;  Service: Cardiovascular;  Laterality: N/A;  . COLONOSCOPY  04/2005  . TONSILLECTOMY    . 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 dutasteride (AVODART) 0.5 MG capsule Take 1 capsule (0.5 mg total) by mouth daily.  Marland Kitchen ELIQUIS 5 MG TABS tablet Take 5 mg by mouth 2 (two) times daily.   .  flecainide (TAMBOCOR) 100 MG tablet Take 1 tablet (100 mg total) by mouth 2 (two) times daily.  . Ipratropium-Albuterol (COMBIVENT RESPIMAT) 20-100 MCG/ACT AERS respimat Inhale 1 puff into the lungs every 6 (six) hours as needed for wheezing or shortness of breath.   . metoprolol tartrate (LOPRESSOR) 25 MG tablet Take 25 mg by mouth twice daily  . tamsulosin (FLOMAX) 0.4 MG CAPS capsule Take 1 capsule (0.4 mg total) by mouth daily.    Allergies  Allergen Reactions  . Ciprofloxacin Other (See Comments)    Unknown      Review of Systems negative except from HPI and PMH  Physical Exam BP 114/80 (BP Location: Left Arm, Patient Position: Sitting, Cuff Size: Normal)   Pulse 89   Ht 6' (1.829 m)   Wt 210 lb 12 oz (95.6 kg)   BMI 28.58 kg/m  Well developed and nourished in no acute distress HENT normal Neck supple with JVP-flat Carotids brisk and full without bruits Clear Irregularly irregular rate and rhythm with controlled ventricular response, no murmurs or gallops Abd-soft with active BS without hepatomegaly No Clubbing cyanosis edema Skin-warm and dry A & Oriented  Grossly normal sensory and motor function  ECG demonstrates atrial fibrillation at 89 Interval-/10/38 Low voltage     Assessment and  Plan  Atrial fibrillation-persistent  Presyncope question posttermination pauses/pauses  Dizziness  rule out stroke  Alcohol intake is exuberant  Sleep apnea-    His atrial fibrillation is persistent.  We have discussed treatment options including alternative 1C therapy, amiodarone therapy which can be initiated as an outpatient and used as a bridge and not withstanding side effects, and class 3 therapy that would require hospitalization for initiation.  He would prefer to avoid hospitalization.  We will try propafenone.  We will start 300 mg twice daily.  He will need repeat cardioversion following his initiation.  He will work on oral appliance for OSA.  Once  that is accomplished, will refer for catheter ablation.  We spent more than 50% of our >25 min visit in face to face counseling regarding the above   Current medicines are reviewed at length with the patient today .  The patient does not  have concerns regarding medicines.

## 2018-02-23 ENCOUNTER — Other Ambulatory Visit: Payer: Self-pay | Admitting: Internal Medicine

## 2018-02-23 DIAGNOSIS — M4807 Spinal stenosis, lumbosacral region: Secondary | ICD-10-CM | POA: Diagnosis not present

## 2018-02-23 DIAGNOSIS — M545 Low back pain: Secondary | ICD-10-CM | POA: Diagnosis not present

## 2018-02-25 ENCOUNTER — Ambulatory Visit: Payer: PPO | Admitting: Internal Medicine

## 2018-02-26 DIAGNOSIS — M4807 Spinal stenosis, lumbosacral region: Secondary | ICD-10-CM | POA: Diagnosis not present

## 2018-02-26 DIAGNOSIS — M545 Low back pain: Secondary | ICD-10-CM | POA: Diagnosis not present

## 2018-03-01 ENCOUNTER — Other Ambulatory Visit: Payer: Self-pay | Admitting: Cardiovascular Disease

## 2018-03-01 ENCOUNTER — Ambulatory Visit: Payer: PPO | Admitting: Certified Registered Nurse Anesthetist

## 2018-03-01 ENCOUNTER — Encounter
Admission: RE | Disposition: A | Discharge status: Home / Self Care | Payer: Self-pay | Source: Ambulatory Visit | Attending: Cardiovascular Disease

## 2018-03-01 ENCOUNTER — Ambulatory Visit
Admission: RE | Admit: 2018-03-01 | Discharge: 2018-03-01 | Disposition: A | Discharge status: Home / Self Care | Payer: PPO | Source: Ambulatory Visit | Attending: Cardiovascular Disease | Admitting: Cardiovascular Disease

## 2018-03-01 ENCOUNTER — Encounter: Payer: Self-pay | Admitting: *Deleted

## 2018-03-01 DIAGNOSIS — I480 Paroxysmal atrial fibrillation: Secondary | ICD-10-CM | POA: Diagnosis not present

## 2018-03-01 DIAGNOSIS — M1990 Unspecified osteoarthritis, unspecified site: Secondary | ICD-10-CM | POA: Insufficient documentation

## 2018-03-01 DIAGNOSIS — N400 Enlarged prostate without lower urinary tract symptoms: Secondary | ICD-10-CM | POA: Diagnosis not present

## 2018-03-01 DIAGNOSIS — E785 Hyperlipidemia, unspecified: Secondary | ICD-10-CM | POA: Diagnosis not present

## 2018-03-01 DIAGNOSIS — Z87891 Personal history of nicotine dependence: Secondary | ICD-10-CM | POA: Insufficient documentation

## 2018-03-01 DIAGNOSIS — E291 Testicular hypofunction: Secondary | ICD-10-CM | POA: Diagnosis not present

## 2018-03-01 DIAGNOSIS — I481 Persistent atrial fibrillation: Secondary | ICD-10-CM

## 2018-03-01 DIAGNOSIS — M48061 Spinal stenosis, lumbar region without neurogenic claudication: Secondary | ICD-10-CM | POA: Diagnosis not present

## 2018-03-01 HISTORY — DX: Dorsalgia, unspecified: M54.9

## 2018-03-01 HISTORY — PX: CARDIOVERSION: EP1203

## 2018-03-01 SURGERY — CARDIOVERSION (CATH LAB)
Anesthesia: General

## 2018-03-01 MED ORDER — METOPROLOL TARTRATE 25 MG PO TABS: 37.5000 mg | ORAL_TABLET | Freq: Two times a day (BID) | ORAL | 3 refills | Status: DC

## 2018-03-01 MED ORDER — SODIUM CHLORIDE 0.9 % IV SOLN
INTRAVENOUS | Status: DC | PRN
  Administered 2018-03-01: 07:00:00 via INTRAVENOUS

## 2018-03-01 MED ORDER — PROPOFOL 10 MG/ML IV BOLUS
INTRAVENOUS | Status: DC | PRN
  Administered 2018-03-01: 10 mg via INTRAVENOUS
  Administered 2018-03-01: 20 mg via INTRAVENOUS
  Administered 2018-03-01: 40 mg via INTRAVENOUS
  Administered 2018-03-01: 10 mg via INTRAVENOUS

## 2018-03-01 MED ORDER — ACETAMINOPHEN 500 MG PO TABS
1000.0000 mg | ORAL_TABLET | Freq: Four times a day (QID) | ORAL | Status: DC | PRN
  Administered 2018-03-01: 1000 mg via ORAL

## 2018-03-01 MED ORDER — ACETAMINOPHEN 500 MG PO TABS
ORAL_TABLET | ORAL | Status: AC
  Administered 2018-03-01: 1000 mg via ORAL
  Filled 2018-03-01: qty 2

## 2018-03-01 MED ORDER — PROPOFOL 10 MG/ML IV BOLUS
INTRAVENOUS | Status: AC
  Filled 2018-03-01: qty 20

## 2018-03-01 NOTE — Discharge Instructions (Signed)
Electrical Cardioversion, Care After °This sheet gives you information about how to care for yourself after your procedure. Your health care provider may also give you more specific instructions. If you have problems or questions, contact your health care provider. °What can I expect after the procedure? °After the procedure, it is common to have: °· Some redness on the skin where the shocks were given. ° °Follow these instructions at home: °· Do not drive for 24 hours if you were given a medicine to help you relax (sedative). °· Take over-the-counter and prescription medicines only as told by your health care provider. °· Ask your health care provider how to check your pulse. Check it often. °· Rest for 48 hours after the procedure or as told by your health care provider. °· Avoid or limit your caffeine use as told by your health care provider. °Contact a health care provider if: °· You feel like your heart is beating too quickly or your pulse is not regular. °· You have a serious muscle cramp that does not go away. °Get help right away if: °· You have discomfort in your chest. °· You are dizzy or you feel faint. °· You have trouble breathing or you are short of breath. °· Your speech is slurred. °· You have trouble moving an arm or leg on one side of your body. °· Your fingers or toes turn cold or blue. °This information is not intended to replace advice given to you by your health care provider. Make sure you discuss any questions you have with your health care provider. °Document Released: 06/22/2013 Document Revised: 04/04/2016 Document Reviewed: 03/07/2016 °Elsevier Interactive Patient Education © 2018 [employer] ° °

## 2018-03-01 NOTE — Transfer of Care (Signed)
Immediate Anesthesia Transfer of Care Note  Patient: Alexander Carey  Procedure(s) Performed: CARDIOVERSION (N/A )  Patient Location: PACU  Anesthesia Type:General  Level of Consciousness: awake and alert   Airway & Oxygen Therapy: Patient Spontanous Breathing and Patient connected to nasal cannula oxygen  Post-op Assessment: Report given to RN and Post -op Vital signs reviewed and stable  Post vital signs: Reviewed and stable  Last Vitals:  Vitals Value Taken Time  BP 118/85 03/01/2018  7:46 AM  Temp    Pulse 71 03/01/2018  7:46 AM  Resp 18 03/01/2018  7:46 AM  SpO2 98 % 03/01/2018  7:46 AM  Vitals shown include unvalidated device data.  Last Pain:  Vitals:   03/01/18 0700  PainSc: 4          Complications: No apparent anesthesia complications

## 2018-03-01 NOTE — OR Nursing (Signed)
Dr Philmore Pali called, informed of decreased pain of chest and left forearm with ice and tylenol. Dr informed of BP. He increased metoprolol to 1 1/2 tablets daily. Pt and wife instructed to take half tablet when get home, since took 25 mg tablet prior to arrival. Wife taught how to check pulse. Encouraged to call Dr Caryl Comes office if heart rate noted to be rapid ie around 100 beats per minute or irregular. No change in appointment date.

## 2018-03-01 NOTE — Anesthesia Post-op Follow-up Note (Signed)
Anesthesia QCDR form completed.        

## 2018-03-01 NOTE — Anesthesia Postprocedure Evaluation (Signed)
Anesthesia Post Note  Patient: CODA MATHEY  Procedure(s) Performed: CARDIOVERSION (N/A )  Patient location during evaluation: Cath Lab Anesthesia Type: General Level of consciousness: awake and alert Pain management: pain level controlled Vital Signs Assessment: post-procedure vital signs reviewed and stable Respiratory status: spontaneous breathing, nonlabored ventilation, respiratory function stable and patient connected to nasal cannula oxygen Cardiovascular status: blood pressure returned to baseline and stable Postop Assessment: no apparent nausea or vomiting Anesthetic complications: no     Last Vitals:  Vitals:   03/01/18 0900 03/01/18 0930  BP: 115/78 118/87  Pulse: 63 63  Resp: 16 16  Temp:    SpO2: 99% 98%    Last Pain:  Vitals:   03/01/18 0930  PainSc: 2                  Martha Clan

## 2018-03-01 NOTE — Anesthesia Preprocedure Evaluation (Signed)
Anesthesia Evaluation  Patient identified by MRN, date of birth, ID band Patient awake    Reviewed: Allergy & Precautions, H&P , NPO status , Patient's Chart, lab work & pertinent test results  History of Anesthesia Complications Negative for: history of anesthetic complications  Airway Mallampati: III  TM Distance: <3 FB Neck ROM: limited    Dental  (+) Chipped, Poor Dentition   Pulmonary neg shortness of breath, former smoker,           Cardiovascular Exercise Tolerance: Good (-) angina(-) Past MI and (-) DOE negative cardio ROS       Neuro/Psych  Neuromuscular disease negative psych ROS   GI/Hepatic negative GI ROS, Neg liver ROS, neg GERD  ,  Endo/Other  negative endocrine ROS  Renal/GU negative Renal ROS  negative genitourinary   Musculoskeletal  (+) Arthritis ,   Abdominal   Peds  Hematology negative hematology ROS (+)   Anesthesia Other Findings Past Medical History: No date: ED (erectile dysfunction) No date: Enlarged prostate No date: Hypogonadism in male No date: Paroxysmal A-fib [facility])  Past Surgical History: 01/13/2018: CARDIOVERSION; N/A     Comment:  Procedure: CARDIOVERSION;  Surgeon: Nelva Bush,               MD;  Location: [facility] ORS;  Service: Cardiovascular;                Laterality: N/A; 04/2005: COLONOSCOPY No date: TONSILLECTOMY 12/03/2016: TRIGGER FINGER RELEASE; Left     Comment:  Procedure: RELEASE / EXCISION OF THE DUPUYTRENS               CONTRACTURE OF LEFT LITTLE FINGER;  Surgeon: Corky Mull, MD;  Location: [place];  Service:               Orthopedics;  Laterality: Left;     Reproductive/Obstetrics negative OB ROS                             Anesthesia Physical  Anesthesia Plan  ASA: III  Anesthesia Plan: General   Post-op Pain Management:    Induction: Intravenous  PONV Risk Score and Plan: Propofol  infusion and TIVA  Airway Management Planned: Natural Airway and Nasal Cannula  Additional Equipment:   Intra-op Plan:   Post-operative Plan:   Informed Consent: I have reviewed the patients History and Physical, chart, labs and discussed the procedure including the risks, benefits and alternatives for the proposed anesthesia with the patient or authorized representative who has indicated his/her understanding and acceptance.   Dental Advisory Given  Plan Discussed with: Anesthesiologist, CRNA and Surgeon  Anesthesia Plan Comments: (Patient consented for risks of anesthesia including but not limited to:  - adverse reactions to medications - risk of intubation if required - damage to teeth, lips or other oral mucosa - sore throat or hoarseness - Damage to heart, brain, lungs or loss of life  Patient voiced understanding.)        Anesthesia Quick Evaluation

## 2018-03-01 NOTE — Anesthesia Procedure Notes (Signed)
Date/Time: 03/01/2018 7:34 AM Performed by: Johnna Acosta, CRNA Pre-anesthesia Checklist: Patient identified, Emergency Drugs available, Suction available, Patient being monitored and Timeout performed Patient Re-evaluated:Patient Re-evaluated prior to induction Oxygen Delivery Method: Nasal cannula Preoxygenation: Pre-oxygenation with 100% oxygen Induction Type: IV induction

## 2018-03-01 NOTE — OR Nursing (Signed)
Dr Rockey Situ paged to inform of chest pain.12 lead EKG repeated due to report of chest pain with radiation to left arm,

## 2018-03-01 NOTE — CV Procedure (Addendum)
Cardioversion procedure note For atrial fibrillation, persistent  Procedure Details:  Consent: Risks of procedure as well as the alternatives and risks of each were explained to the (patient/caregiver). Consent for procedure obtained.  Time Out: Verified patient identification, verified procedure, site/side was marked, verified correct patient position, special equipment/implants available, medications/allergies/relevent history reviewed, required imaging and test results available. Performed  Patient placed on cardiac monitor, pulse oximetry, supplemental oxygen as necessary.  Sedation given: propofol IV, Dr. Rosey Bath Pacer pads placed anterior and posterior chest.   Cardioverted 4 time(s).  Cardioverted at  150 J, 200J, changed pad position, 200J, 200J with external compression  Synchronized biphasic Converted to NSR, rate 60 bpm   Evaluation: Findings: Post procedure EKG shows: NSR Complications: None Patient did tolerate procedure well.  Med changes: Please increase the metoprolol up to 37.5 mg BID  Time Spent Directly with the Patient:  45 minutes   Esmond Plants, M.D., Ph.D.

## 2018-03-03 DIAGNOSIS — M545 Low back pain: Secondary | ICD-10-CM | POA: Diagnosis not present

## 2018-03-03 DIAGNOSIS — M4807 Spinal stenosis, lumbosacral region: Secondary | ICD-10-CM | POA: Diagnosis not present

## 2018-03-04 ENCOUNTER — Telehealth: Payer: Self-pay | Admitting: Internal Medicine

## 2018-03-04 NOTE — Telephone Encounter (Signed)
Called and they are closed with voice recording.

## 2018-03-04 NOTE — Telephone Encounter (Signed)
Sharyn Lull from [facility] calling States that patient is awaiting surgery but it is on hold as they are needing to have his sleep apnea under control States Dr. Laren Everts is requesting a medical referral to start process Please call Sharyn Lull with any questions at [phone]

## 2018-03-05 NOTE — Telephone Encounter (Signed)
I called and spoke with Sharyn Lull at [facility]. She states she spoke with the patient's wife and the patient is going to be seeing Dr. Terance Hart for evaluation for an oral appliance for sleep apnea.  They are requesting a referral be sent to them.  I advised Sharyn Lull that Dr. Caryl Comes did not originate the initial sleep study, but this was done by Dr. Josefa Half at [facility]. I advised Sharyn Lull I could fax Dr. Olin Pia most recent office note stating that the patient was going to pursue an oral appliance. She said that would be fine so Dr. Terance Hart could have something to look at.  Dr. Olin Pia office note dated 02/18/18 was faxed to [facility] at [phone]. Confirmation received.

## 2018-03-08 LAB — EXERCISE TOLERANCE TEST
CSEPEW: 6.4 METS
CSEPPHR: 93 {beats}/min
Exercise duration (min): 5 min
Exercise duration (sec): 48 s
MPHR: 153 {beats}/min
Percent HR: 60 %
Rest HR: 65 {beats}/min

## 2018-03-09 DIAGNOSIS — M4807 Spinal stenosis, lumbosacral region: Secondary | ICD-10-CM | POA: Diagnosis not present

## 2018-03-09 DIAGNOSIS — M545 Low back pain: Secondary | ICD-10-CM | POA: Diagnosis not present

## 2018-03-11 DIAGNOSIS — M4807 Spinal stenosis, lumbosacral region: Secondary | ICD-10-CM | POA: Diagnosis not present

## 2018-03-11 DIAGNOSIS — M545 Low back pain: Secondary | ICD-10-CM | POA: Diagnosis not present

## 2018-03-16 DIAGNOSIS — M545 Low back pain: Secondary | ICD-10-CM | POA: Diagnosis not present

## 2018-03-16 DIAGNOSIS — M4807 Spinal stenosis, lumbosacral region: Secondary | ICD-10-CM | POA: Diagnosis not present

## 2018-03-23 DIAGNOSIS — M545 Low back pain: Secondary | ICD-10-CM | POA: Diagnosis not present

## 2018-03-23 DIAGNOSIS — M4807 Spinal stenosis, lumbosacral region: Secondary | ICD-10-CM | POA: Diagnosis not present

## 2018-03-25 DIAGNOSIS — M545 Low back pain: Secondary | ICD-10-CM | POA: Diagnosis not present

## 2018-03-25 DIAGNOSIS — M4807 Spinal stenosis, lumbosacral region: Secondary | ICD-10-CM | POA: Diagnosis not present

## 2018-03-31 DIAGNOSIS — M545 Low back pain: Secondary | ICD-10-CM | POA: Diagnosis not present

## 2018-03-31 DIAGNOSIS — M4807 Spinal stenosis, lumbosacral region: Secondary | ICD-10-CM | POA: Diagnosis not present

## 2018-04-07 ENCOUNTER — Telehealth: Payer: Self-pay | Admitting: Internal Medicine

## 2018-04-07 DIAGNOSIS — I4819 Other persistent atrial fibrillation: Secondary | ICD-10-CM

## 2018-04-07 NOTE — Telephone Encounter (Signed)
Pt spouse calling stating they had the CT reschedule due to patient's HR being so high  They mentioned to them that patient would need labs done prior to this  Would like to know what and where does these labs need to be done at  Please advise

## 2018-04-08 NOTE — Telephone Encounter (Signed)
I called and spoke with Mrs. Strey. She is aware the patient will need a repeat BMP prior to his cardiac CT. I have advised her I have put the order in for this to be done at the [facility] lab. This can be on a walk in basis between now and the day prior to his CT scan. She voices understanding and is agreeable.

## 2018-04-12 ENCOUNTER — Other Ambulatory Visit
Admission: RE | Admit: 2018-04-12 | Discharge: 2018-04-12 | Disposition: A | Discharge status: Home / Self Care | Payer: PPO | Source: Ambulatory Visit | Attending: Internal Medicine | Admitting: Internal Medicine

## 2018-04-12 DIAGNOSIS — I481 Persistent atrial fibrillation: Secondary | ICD-10-CM | POA: Diagnosis not present

## 2018-04-12 DIAGNOSIS — I4819 Other persistent atrial fibrillation: Secondary | ICD-10-CM

## 2018-04-12 LAB — BASIC METABOLIC PANEL
ANION GAP: 8 (ref 5–15)
BUN: 12 mg/dL (ref 8–23)
CHLORIDE: 103 mmol/L (ref 98–111)
CO2: 25 mmol/L (ref 22–32)
Calcium: 8.8 mg/dL — ABNORMAL LOW (ref 8.9–10.3)
Creatinine, Ser: 0.93 mg/dL (ref 0.61–1.24)
Glucose, Bld: 96 mg/dL (ref 70–99)
POTASSIUM: 4.5 mmol/L (ref 3.5–5.1)
SODIUM: 136 mmol/L (ref 135–145)

## 2018-04-15 ENCOUNTER — Encounter (HOSPITAL_COMMUNITY): Payer: Self-pay

## 2018-04-15 ENCOUNTER — Ambulatory Visit (HOSPITAL_COMMUNITY)
Admission: RE | Admit: 2018-04-15 | Discharge: 2018-04-15 | Disposition: A | Discharge status: Home / Self Care | Payer: PPO | Source: Ambulatory Visit | Attending: Cardiovascular Disease | Admitting: Cardiovascular Disease

## 2018-04-15 ENCOUNTER — Ambulatory Visit (HOSPITAL_COMMUNITY): Admission: RE | Admit: 2018-04-15 | Payer: PPO | Source: Ambulatory Visit

## 2018-04-15 NOTE — Progress Notes (Signed)
Dr. Johnsie Cancel informed that patient is here for CT heart scan. Patient is in afib hr 70-100s. Per Dr. Johnsie Cancel, we are unable to proceed to with scan because of irregular heart rate. Patient informed of situation and told to call his cardiologist to let them know he is back in afib. Patient states understanding. Dr. Johnsie Cancel to get in touch with ordering physician.

## 2018-04-16 ENCOUNTER — Telehealth: Payer: Self-pay | Admitting: Internal Medicine

## 2018-04-16 NOTE — Telephone Encounter (Signed)
I spoke with Mrs. Upshaw. She states they went for the Cardiac CT to be done yesterday in [place] and there were 2 issues.  1) radiology said the wrong test was ordered - I advised Mrs. Noxon that the only order I see in his chart is for the Cardiac CTA dated from May 2019, so I am unsure what order they were looking at.  2) the patient was in a-fib again yesterday when he was hooked up with HR was 91 bpm/ BP 124/78. - the patient cannot tell when he goes in and out of a-fib  Per Mrs. Wassenaar, he is seeing a Pharmacist, community in [place] that is setting him up for an oral appliance for sleep apnea as he cannot tolerate C-PAP. They are working on getting the appliance.  The patient is frustrated at this point with his situation. Mrs. Tribby states they are due to see Dr. Caryl Comes 04/29/18, but the patient is afraid that Dr. Caryl Comes will just want to try something else. She questions if the patient should follow up with Dr. Josefa Half (his general cards). I advised they are certainly ok to do that to have him weigh in on the situation. She is aware I will leave his appointment scheduled with Dr. Caryl Comes and they want to call back and cancel/ reschedule this until they see Dr. Josefa Half, this is fine. I advised her that if his sleep apnea is not being treated, then they is playing against him as well being able to maintain NSR.  I also advised that there is no set course of treatment for a-fib. She inquired about ablation or a PPM. I advised that ablation/ PPM in association with AV node ablations are last resorts if possible and there are other criteria to take in to consideration as well. She verbalizes understanding of the above.

## 2018-04-16 NOTE — Telephone Encounter (Signed)
noted 

## 2018-04-16 NOTE — Telephone Encounter (Signed)
Patient wife calling States that patient had a cardiac CT yesterday and it was unsuccessful Would like to speak with Nira Conn Please call

## 2018-04-27 ENCOUNTER — Other Ambulatory Visit: Payer: Self-pay | Admitting: Neurosurgery

## 2018-04-27 ENCOUNTER — Telehealth: Payer: Self-pay | Admitting: Nurse Practitioner

## 2018-04-27 DIAGNOSIS — M4316 Spondylolisthesis, lumbar region: Secondary | ICD-10-CM

## 2018-04-27 NOTE — Telephone Encounter (Signed)
Phone call to patient to verify medication list and allergies for myelogram procedure (spoke with wife). Pt's wife instructed he will need to hold Eliquis for 4hrs prior to myelogram procedure (pending cardiologist Dr Saralyn Pilar approval). Pt's wife verbalized understanding. Faxed thinner hold request form. Awaiting response.

## 2018-04-29 ENCOUNTER — Telehealth: Payer: Self-pay | Admitting: Internal Medicine

## 2018-04-29 ENCOUNTER — Encounter: Payer: Self-pay | Admitting: Internal Medicine

## 2018-04-29 ENCOUNTER — Ambulatory Visit: Payer: PPO | Admitting: Internal Medicine

## 2018-04-29 VITALS — BP 132/72 | HR 77 | Ht 72.0 in | Wt 210.0 lb

## 2018-04-29 DIAGNOSIS — I4819 Other persistent atrial fibrillation: Secondary | ICD-10-CM

## 2018-04-29 DIAGNOSIS — Z79899 Other long term (current) drug therapy: Secondary | ICD-10-CM | POA: Diagnosis not present

## 2018-04-29 DIAGNOSIS — I481 Persistent atrial fibrillation: Secondary | ICD-10-CM | POA: Diagnosis not present

## 2018-04-29 DIAGNOSIS — Z01812 Encounter for preprocedural laboratory examination: Secondary | ICD-10-CM | POA: Diagnosis not present

## 2018-04-29 MED ORDER — AMIODARONE HCL 400 MG PO TABS: ORAL_TABLET | ORAL | 0 refills | Status: DC

## 2018-04-29 NOTE — H&P (View-Only) (Signed)
Patient Care Team: Valera Castle, MD as PCP - General (Family Medicine) Bary Castilla Forest Gleason, MD (General Surgery)   HPI  Alexander Carey is a 67 y.o. male Seen in follow-up for atrial fibrillation accompanied by symptoms of dizziness.  Event recorder utilized to exclude posttermination pauses    He was started on flecainide and calls suggested concern with fogginess; this was discontinued and he was started on propafenone.  He underwent cardioversion and felt better thereafter prior to reverting to atrial fibrillation.  This is bothersome with energy and shortness of breath as well as some dizziness.  No bleeding issues.  Also been working on  oral appliance for OSA   Dizziness continues to be an issue.  This is largely orthostatic and with bending.  Fatigue continues to be an issue.    DATE TEST EF   7/18 Echo   55-65 % LA size normal                Date Cr TSH LFTs Hgb  7//18 0.72 3.54 25 15.6  4/19 0.85    14.5   On Anticoagulation;  No bleeding issues   No palps   Records and Results Reviewed   Past Medical History:  Diagnosis Date  . Back pain    lower  . ED (erectile dysfunction)   . Enlarged prostate   . Hypogonadism in male   . Paroxysmal A-fib [facility])     Past Surgical History:  Procedure Laterality Date  . CARDIOVERSION N/A 01/13/2018   Procedure: CARDIOVERSION;  Surgeon: Nelva Bush, MD;  Location: [facility] OR[facility];  Service: Cardiovascular;  Laterality: N/A;  . CARDIOVERSION N/A 01/26/2018   Procedure: CARDIOVERSION;  Surgeon: Minna Merritts, MD;  Location: [facility];  Service: Cardiovascular;  Laterality: N/A;  . CARDIOVERSION N/A 03/01/2018   Procedure: CARDIOVERSION;  Surgeon: Minna Merritts, MD;  Location: [facility] OR[facility];  Service: Cardiovascular;  Laterality: N/A;  . COLONOSCOPY  04/2005  . TONSILLECTOMY    . TRIGGER FINGER RELEASE Left 12/03/2016   Procedure: RELEASE / EXCISION OF THE DUPUYTRENS CONTRACTURE OF LEFT LITTLE  FINGER;  Surgeon: Corky Mull, MD;  Location: [place];  Service: Orthopedics;  Laterality: Left;    Current Meds  Medication Sig  . dutasteride (AVODART) 0.5 MG capsule Take 1 capsule (0.5 mg total) by mouth daily.  Marland Kitchen ELIQUIS 5 MG TABS tablet Take 5 mg by mouth 2 (two) times daily.   . Ipratropium-Albuterol (COMBIVENT RESPIMAT) 20-100 MCG/ACT AERS respimat Inhale 1 puff into the lungs every 6 (six) hours as needed for wheezing or shortness of breath.   . metoprolol tartrate (LOPRESSOR) 25 MG tablet Take 1.5 tablets (37.5 mg total) by mouth 2 (two) times daily.  . propafenone (RYTHMOL SR) 325 MG 12 hr capsule Take 1 capsule (325 mg total) by mouth 2 (two) times daily.  . tamsulosin (FLOMAX) 0.4 MG CAPS capsule Take 1 capsule (0.4 mg total) by mouth daily.    Allergies  Allergen Reactions  . Ciprofloxacin Other (See Comments)    Unknown      Review of Systems negative except from HPI and PMH  Physical Exam BP 132/72 (BP Location: Left Arm, Patient Position: Sitting, Cuff Size: Normal)   Pulse 77   Ht 6' (1.829 m)   Wt 210 lb (95.3 kg)   BMI 28.48 kg/m  Well developed and nourished in no acute distress HENT normal Neck supple with JVP-flat Carotids brisk and full without bruits  Clear Irregularly irregular rate and rhythm with controlled ventricular response, no murmurs or gallops Abd-soft with active BS without hepatomegaly No Clubbing cyanosis edema Skin-warm and dry A & Oriented  Grossly normal sensory and motor function   ECG demonstrates atrial fibrillation at 77 Interval-/zero 9/39 Low voltage  Assessment and  Plan  Atrial fibrillation-persistent  Presyncope question posttermination pauses/pauses  Dizziness rule out stroke  Alcohol intake is exuberant  Sleep apnea-   Low voltage   Patient has persistent atrial fibrillation.  We will discontinue propafenone transitioning to amiodarone.  We have reviewed side effects and surveillance  laboratories were normal a year ago.  We will recheck them and again in a few weeks  we will undertake cardioversion.  He will be referred to Dr. Rayann Heman for consideration of catheter ablation.  We reviewed alcohol intake.  I have encouraged him to try to decrease to 1-2 mixed drinks a day ( each is about 1 ounce)  He is working on oral appliance for his sleep apnea.  On Anticoagulation;  No bleeding issues   Low voltage is of some concern; his echo demonstrated normal wall thickness.  Chest x-ray was reviewed.  There is some retrosternal air  He is scheduled for myelogram for his back.

## 2018-04-29 NOTE — Progress Notes (Signed)
Patient Care Team: Valera Castle, MD as PCP - General (Family Medicine) Bary Castilla Forest Gleason, MD (General Surgery)   HPI  Alexander Carey is a 67 y.o. male Seen in follow-up for atrial fibrillation accompanied by symptoms of dizziness.  Event recorder utilized to exclude posttermination pauses    He was started on flecainide and calls suggested concern with fogginess; this was discontinued and he was started on propafenone.  He underwent cardioversion and felt better thereafter prior to reverting to atrial fibrillation.  This is bothersome with energy and shortness of breath as well as some dizziness.  No bleeding issues.  Also been working on  oral appliance for OSA   Dizziness continues to be an issue.  This is largely orthostatic and with bending.  Fatigue continues to be an issue.    DATE TEST EF   7/18 Echo   55-65 % LA size normal                Date Cr TSH LFTs Hgb  7//18 0.72 3.54 25 15.6  4/19 0.85    14.5   On Anticoagulation;  No bleeding issues   No palps   Records and Results Reviewed   Past Medical History:  Diagnosis Date  . Back pain    lower  . ED (erectile dysfunction)   . Enlarged prostate   . Hypogonadism in male   . Paroxysmal A-fib [facility])     Past Surgical History:  Procedure Laterality Date  . CARDIOVERSION N/A 01/13/2018   Procedure: CARDIOVERSION;  Surgeon: Nelva Bush, MD;  Location: [facility] ORS;  Service: Cardiovascular;  Laterality: N/A;  . CARDIOVERSION N/A 01/26/2018   Procedure: CARDIOVERSION;  Surgeon: Minna Merritts, MD;  Location: [facility];  Service: Cardiovascular;  Laterality: N/A;  . CARDIOVERSION N/A 03/01/2018   Procedure: CARDIOVERSION;  Surgeon: Minna Merritts, MD;  Location: [facility];  Service: Cardiovascular;  Laterality: N/A;  . COLONOSCOPY  04/2005  . TONSILLECTOMY    . 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 dutasteride (AVODART) 0.5 MG capsule Take 1 capsule (0.5 mg total) by mouth daily.  Marland Kitchen ELIQUIS 5 MG TABS tablet Take 5 mg by mouth 2 (two) times daily.   . Ipratropium-Albuterol (COMBIVENT RESPIMAT) 20-100 MCG/ACT AERS respimat Inhale 1 puff into the lungs every 6 (six) hours as needed for wheezing or shortness of breath.   . metoprolol tartrate (LOPRESSOR) 25 MG tablet Take 1.5 tablets (37.5 mg total) by mouth 2 (two) times daily.  . propafenone (RYTHMOL SR) 325 MG 12 hr capsule Take 1 capsule (325 mg total) by mouth 2 (two) times daily.  . tamsulosin (FLOMAX) 0.4 MG CAPS capsule Take 1 capsule (0.4 mg total) by mouth daily.    Allergies  Allergen Reactions  . Ciprofloxacin Other (See Comments)    Unknown      Review of Systems negative except from HPI and PMH  Physical Exam BP 132/72 (BP Location: Left Arm, Patient Position: Sitting, Cuff Size: Normal)   Pulse 77   Ht 6' (1.829 m)   Wt 210 lb (95.3 kg)   BMI 28.48 kg/m  Well developed and nourished in no acute distress HENT normal Neck supple with JVP-flat Carotids brisk and full without bruits  Clear Irregularly irregular rate and rhythm with controlled ventricular response, no murmurs or gallops Abd-soft with active BS without hepatomegaly No Clubbing cyanosis edema Skin-warm and dry A & Oriented  Grossly normal sensory and motor function   ECG demonstrates atrial fibrillation at 77 Interval-/zero 9/39 Low voltage  Assessment and  Plan  Atrial fibrillation-persistent  Presyncope question posttermination pauses/pauses  Dizziness rule out stroke  Alcohol intake is exuberant  Sleep apnea-   Low voltage   Patient has persistent atrial fibrillation.  We will discontinue propafenone transitioning to amiodarone.  We have reviewed side effects and surveillance  laboratories were normal a year ago.  We will recheck them and again in a few weeks  we will undertake cardioversion.  He will be referred to Dr. Rayann Heman for consideration of catheter ablation.  We reviewed alcohol intake.  I have encouraged him to try to decrease to 1-2 mixed drinks a day ( each is about 1 ounce)  He is working on oral appliance for his sleep apnea.  On Anticoagulation;  No bleeding issues   Low voltage is of some concern; his echo demonstrated normal wall thickness.  Chest x-ray was reviewed.  There is some retrosternal air  He is scheduled for myelogram for his back.

## 2018-04-29 NOTE — Telephone Encounter (Signed)
Pharmacist is calling regarding Amiodarone, which interacts with Propafenone

## 2018-04-29 NOTE — Telephone Encounter (Signed)
I called and spoke with the pharmacist.  I advised him that we stopped the patient's propafenone today and replaced it with amiodarone.

## 2018-04-29 NOTE — Patient Instructions (Addendum)
Medication Instructions: - Your physician has recommended you make the following change in your medication:   1) STOP Rythmol (propafenone)  2) START Amiodarone: - 400 mg take 1 tablet by mouth TWICE daily x 2 weeks, then take - 400 mg take 1 tablet by mouth ONCE daily x 2 weeks, then take - 200 mg take 1 tablet by mouth ONCE daily (Heather will sent this dose in to the pharmacy in about 3 weeks)  Labwork: - Your physician recommends that you return for lab work in: 3 weeks (just prior to your Cardioversion on Sept 3,4, 5th) [place] at [facility]- 1st desk on the right to check in.  Procedures/Testing: - Your physician has recommended that you have a Cardioversion (DCCV)- in 3 weeks. Electrical Cardioversion uses a jolt of electricity to your heart either through paddles or wired patches attached to your chest. This is a controlled, usually prescheduled, procedure. Defibrillation is done under light anesthesia in the hospital, and you usually go home the day of the procedure. This is done to get your heart back into a normal rhythm. You are not awake for the procedure.   You are scheduled for a Cardioversion on Friday 05/21/18 with Dr. Fletcher Anon. Please arrive at the [place] [facility] at 6:30 a.m. on the day of your procedure.  DIET INSTRUCTIONS:  Nothing to eat or drink after midnight except your medications with a              sip of water.         1) Labs: 9/3, 4, or 5th  2) Medications:  YOU MAY TAKE ALL of your  medications with a small amount of water.  3) Must have a responsible person to drive you home.  4) Bring a current list of your medications and current insurance cards.    If you have any questions after you get home, please call the office at [phone]   Follow-Up: - You have been referred to Dr. Thompson Grayer (in about 4 weeks) for consideration of A-fib ablation   Any Additional Special Instructions Will Be Listed Below (If Applicable).     If you need a  refill on your cardiac medications before your next appointment, please call your pharmacy.

## 2018-05-10 ENCOUNTER — Ambulatory Visit
Admission: RE | Admit: 2018-05-10 | Discharge: 2018-05-10 | Disposition: A | Discharge status: Home / Self Care | Payer: PPO | Source: Ambulatory Visit | Attending: Neurosurgery | Admitting: Neurosurgery

## 2018-05-10 DIAGNOSIS — M4316 Spondylolisthesis, lumbar region: Secondary | ICD-10-CM

## 2018-05-10 DIAGNOSIS — M5126 Other intervertebral disc displacement, lumbar region: Secondary | ICD-10-CM | POA: Diagnosis not present

## 2018-05-10 MED ORDER — ONDANSETRON HCL 4 MG/2ML IJ SOLN
4.0000 mg | Freq: Once | INTRAMUSCULAR | Status: AC
  Administered 2018-05-10: 4 mg via INTRAMUSCULAR

## 2018-05-10 MED ORDER — IOPAMIDOL (ISOVUE-M 200) INJECTION 41%
15.0000 mL | Freq: Once | INTRAMUSCULAR | Status: AC
  Administered 2018-05-10: 15 mL via INTRATHECAL

## 2018-05-10 MED ORDER — MEPERIDINE HCL 100 MG/ML IJ SOLN
75.0000 mg | Freq: Once | INTRAMUSCULAR | Status: AC
  Administered 2018-05-10: 75 mg via INTRAMUSCULAR

## 2018-05-10 MED ORDER — DIAZEPAM 5 MG PO TABS: 5.0000 mg | ORAL_TABLET | Freq: Once | ORAL | Status: DC

## 2018-05-10 NOTE — Discharge Instructions (Signed)

## 2018-05-18 ENCOUNTER — Other Ambulatory Visit
Admission: RE | Admit: 2018-05-18 | Discharge: 2018-05-18 | Disposition: A | Discharge status: Home / Self Care | Payer: PPO | Source: Ambulatory Visit | Attending: Internal Medicine | Admitting: Internal Medicine

## 2018-05-18 DIAGNOSIS — Z01812 Encounter for preprocedural laboratory examination: Secondary | ICD-10-CM | POA: Insufficient documentation

## 2018-05-18 DIAGNOSIS — I481 Persistent atrial fibrillation: Secondary | ICD-10-CM | POA: Diagnosis not present

## 2018-05-18 DIAGNOSIS — Z79899 Other long term (current) drug therapy: Secondary | ICD-10-CM

## 2018-05-18 DIAGNOSIS — I4819 Other persistent atrial fibrillation: Secondary | ICD-10-CM

## 2018-05-18 LAB — COMPREHENSIVE METABOLIC PANEL
ALK PHOS: 68 U/L (ref 38–126)
ALT: 21 U/L (ref 0–44)
ANION GAP: 7 (ref 5–15)
AST: 20 U/L (ref 15–41)
Albumin: 4.1 g/dL (ref 3.5–5.0)
BUN: 13 mg/dL (ref 8–23)
CALCIUM: 8.8 mg/dL — AB (ref 8.9–10.3)
CO2: 26 mmol/L (ref 22–32)
Chloride: 102 mmol/L (ref 98–111)
Creatinine, Ser: 0.92 mg/dL (ref 0.61–1.24)
Glucose, Bld: 92 mg/dL (ref 70–99)
Potassium: 4.6 mmol/L (ref 3.5–5.1)
SODIUM: 135 mmol/L (ref 135–145)
TOTAL PROTEIN: 6.3 g/dL — AB (ref 6.5–8.1)
Total Bilirubin: 0.8 mg/dL (ref 0.3–1.2)

## 2018-05-18 LAB — CBC WITH DIFFERENTIAL/PLATELET
Basophils Absolute: 0 10*3/uL (ref 0–0.1)
Basophils Relative: 1 %
EOS ABS: 0.2 10*3/uL (ref 0–0.7)
Eosinophils Relative: 3 %
HEMATOCRIT: 42.5 % (ref 40.0–52.0)
HEMOGLOBIN: 14.7 g/dL (ref 13.0–18.0)
LYMPHS ABS: 1.2 10*3/uL (ref 1.0–3.6)
Lymphocytes Relative: 20 %
MCH: 36.5 pg — AB (ref 26.0–34.0)
MCHC: 34.6 g/dL (ref 32.0–36.0)
MCV: 105.3 fL — ABNORMAL HIGH (ref 80.0–100.0)
MONOS PCT: 13 %
Monocytes Absolute: 0.8 10*3/uL (ref 0.2–1.0)
NEUTROS PCT: 63 %
Neutro Abs: 3.6 10*3/uL (ref 1.4–6.5)
Platelets: 178 10*3/uL (ref 150–440)
RBC: 4.03 MIL/uL — ABNORMAL LOW (ref 4.40–5.90)
RDW: 13.7 % (ref 11.5–14.5)
WBC: 5.8 10*3/uL (ref 3.8–10.6)

## 2018-05-18 LAB — TSH: TSH: 8.512 u[IU]/mL — ABNORMAL HIGH (ref 0.350–4.500)

## 2018-05-21 ENCOUNTER — Encounter
Admission: RE | Disposition: A | Discharge status: Home / Self Care | Payer: Self-pay | Source: Ambulatory Visit | Attending: Cardiovascular Disease

## 2018-05-21 ENCOUNTER — Ambulatory Visit
Admission: RE | Admit: 2018-05-21 | Discharge: 2018-05-21 | Disposition: A | Discharge status: Home / Self Care | Payer: PPO | Source: Ambulatory Visit | Attending: Cardiovascular Disease | Admitting: Cardiovascular Disease

## 2018-05-21 ENCOUNTER — Ambulatory Visit: Payer: PPO | Admitting: Anesthesiology

## 2018-05-21 DIAGNOSIS — G4733 Obstructive sleep apnea (adult) (pediatric): Secondary | ICD-10-CM | POA: Insufficient documentation

## 2018-05-21 DIAGNOSIS — E785 Hyperlipidemia, unspecified: Secondary | ICD-10-CM | POA: Diagnosis not present

## 2018-05-21 DIAGNOSIS — Z87891 Personal history of nicotine dependence: Secondary | ICD-10-CM | POA: Insufficient documentation

## 2018-05-21 DIAGNOSIS — R55 Syncope and collapse: Secondary | ICD-10-CM | POA: Diagnosis not present

## 2018-05-21 DIAGNOSIS — R42 Dizziness and giddiness: Secondary | ICD-10-CM | POA: Insufficient documentation

## 2018-05-21 DIAGNOSIS — J302 Other seasonal allergic rhinitis: Secondary | ICD-10-CM | POA: Diagnosis not present

## 2018-05-21 DIAGNOSIS — Z7901 Long term (current) use of anticoagulants: Secondary | ICD-10-CM | POA: Insufficient documentation

## 2018-05-21 DIAGNOSIS — N400 Enlarged prostate without lower urinary tract symptoms: Secondary | ICD-10-CM | POA: Insufficient documentation

## 2018-05-21 DIAGNOSIS — I481 Persistent atrial fibrillation: Secondary | ICD-10-CM | POA: Insufficient documentation

## 2018-05-21 DIAGNOSIS — I4891 Unspecified atrial fibrillation: Secondary | ICD-10-CM | POA: Diagnosis not present

## 2018-05-21 DIAGNOSIS — Z79899 Other long term (current) drug therapy: Secondary | ICD-10-CM | POA: Insufficient documentation

## 2018-05-21 HISTORY — PX: CARDIOVERSION: EP1203

## 2018-05-21 SURGERY — CARDIOVERSION (CATH LAB)
Anesthesia: General

## 2018-05-21 MED ORDER — PROPOFOL 10 MG/ML IV BOLUS
INTRAVENOUS | Status: AC
  Filled 2018-05-21: qty 20

## 2018-05-21 MED ORDER — SODIUM CHLORIDE 0.9 % IV SOLN
INTRAVENOUS | Status: DC | PRN
  Administered 2018-05-21: 07:00:00 via INTRAVENOUS

## 2018-05-21 MED ORDER — ONDANSETRON HCL 4 MG/2ML IJ SOLN: 4.0000 mg | Freq: Once | INTRAMUSCULAR | Status: DC | PRN

## 2018-05-21 MED ORDER — FENTANYL CITRATE (PF) 100 MCG/2ML IJ SOLN: 25.0000 ug | INTRAMUSCULAR | Status: DC | PRN

## 2018-05-21 MED ORDER — PROPOFOL 10 MG/ML IV BOLUS
INTRAVENOUS | Status: DC | PRN
  Administered 2018-05-21: 60 mg via INTRAVENOUS

## 2018-05-21 NOTE — CV Procedure (Signed)
Cardioversion note: A standard informed consent was obtained. Timeout was performed. The pads were placed in the anterior posterior fashion. The patient was given propofol by the anesthesia team.  Successful cardioversion was achieved after a second 200 j shock.  The patient converted to sinus rhythm. Pre-and post EKGs were reviewed. The patient tolerated the procedure with no immediate complications.  Recommendations: Continue same medications and follow-up in 2-3 weeks.

## 2018-05-21 NOTE — Interval H&P Note (Signed)
History and Physical Interval Note:  05/21/2018 7:59 AM  Alexander Carey  has presented today for surgery, with the diagnosis of Cardioversion  Afib  The various methods of treatment have been discussed with the patient and family. After consideration of risks, benefits and other options for treatment, the patient has consented to  Procedure(s): CARDIOVERSION (N/A) as a surgical intervention .  The patient's history has been reviewed, patient examined, no change in status, stable for surgery.  I have reviewed the patient's chart and labs.  Questions were answered to the patient's satisfaction.     [name]

## 2018-05-21 NOTE — Discharge Instructions (Signed)
Electrical Cardioversion, Care After This sheet gives you information about how to care for yourself after your procedure. Your health care provider may also give you more specific instructions. If you have problems or questions, contact your health care provider. What can I expect after the procedure? After the procedure, it is common to have:  Some redness on the skin where the shocks were given.  Follow these instructions at home:  Do not drive for 24 hours if you were given a medicine to help you relax (sedative).  Take over-the-counter and prescription medicines only as told by your health care provider.  Ask your health care provider how to check your pulse. Check it often.  Rest for 48 hours after the procedure or as told by your health care provider.  Avoid or limit your caffeine use as told by your health care provider. Contact a health care provider if:  You feel like your heart is beating too quickly or your pulse is not regular.  You have a serious muscle cramp that does not go away. Get help right away if:  You have discomfort in your chest.  You are dizzy or you feel faint.  You have trouble breathing or you are short of breath.  Your speech is slurred.  You have trouble moving an arm or leg on one side of your body.  Your fingers or toes turn cold or blue. This information is not intended to replace advice given to you by your health care provider. Make sure you discuss any questions you have with your health care provider. Document Released: 06/22/2013 Document Revised: 04/04/2016 Document Reviewed: 03/07/2016 Elsevier Interactive Patient Education  2018 [place]. Moderate Conscious Sedation, Adult, Care After These instructions provide you with information about caring for yourself after your procedure. Your health care provider may also give you more specific instructions. Your treatment has been planned according to current medical practices, but  problems sometimes occur. Call your health care provider if you have any problems or questions after your procedure. What can I expect after the procedure? After your procedure, it is common:  To feel sleepy for several hours.  To feel clumsy and have poor balance for several hours.  To have poor judgment for several hours.  To vomit if you eat too soon.  Follow these instructions at home: For at least 24 hours after the procedure:   Do not: ? Participate in activities where you could fall or become injured. ? Drive. ? Use heavy machinery. ? Drink alcohol. ? Take sleeping pills or medicines that cause drowsiness. ? Make important decisions or sign legal documents. ? Take care of children on your own.  Rest. Eating and drinking  Follow the diet recommended by your health care provider.  If you vomit: ? Drink water, juice, or soup when you can drink without vomiting. ? Make sure you have little or no nausea before eating solid foods. General instructions  Have a responsible adult stay with you until you are awake and alert.  Take over-the-counter and prescription medicines only as told by your health care provider.  If you smoke, do not smoke without supervision.  Keep all follow-up visits as told by your health care provider. This is important. Contact a health care provider if:  You keep feeling nauseous or you keep vomiting.  You feel light-headed.  You develop a rash.  You have a fever. Get help right away if:  You have trouble breathing. This information is not intended  to replace advice given to you by your health care provider. Make sure you discuss any questions you have with your health care provider. Document Released: 06/22/2013 Document Revised: 02/04/2016 Document Reviewed: 12/22/2015 Elsevier Interactive Patient Education  [employer].

## 2018-05-21 NOTE — Anesthesia Postprocedure Evaluation (Signed)
Anesthesia Post Note  Patient: Alexander Carey  Procedure(s) Performed: CARDIOVERSION (N/A )  Patient location during evaluation: PACU Anesthesia Type: General Level of consciousness: awake and alert Pain management: pain level controlled Vital Signs Assessment: post-procedure vital signs reviewed and stable Respiratory status: spontaneous breathing, nonlabored ventilation, respiratory function stable and patient connected to nasal cannula oxygen Cardiovascular status: blood pressure returned to baseline and stable Postop Assessment: no apparent nausea or vomiting Anesthetic complications: no     Last Vitals:  Vitals:   05/21/18 0815 05/21/18 0830  BP: 117/80 117/82  Pulse:    Resp: 15 13  Temp:    SpO2: 99%     Last Pain:  Vitals:   05/21/18 0815  TempSrc:   PainSc: 0-No pain                 Molli Barrows

## 2018-05-21 NOTE — Anesthesia Post-op Follow-up Note (Signed)
Anesthesia QCDR form completed.        

## 2018-05-21 NOTE — Anesthesia Procedure Notes (Signed)
Date/Time: 05/21/2018 7:30 AM Performed by: Nelda Marseille, CRNA Pre-anesthesia Checklist: Patient identified, Emergency Drugs available, Suction available, Patient being monitored and Timeout performed Oxygen Delivery Method: Nasal cannula

## 2018-05-21 NOTE — Anesthesia Preprocedure Evaluation (Signed)
Anesthesia Evaluation  Patient identified by MRN, date of birth, ID band Patient awake    Reviewed: Allergy & Precautions, H&P , NPO status , Patient's Chart, lab work & pertinent test results, reviewed documented beta blocker date and time   Airway Mallampati: II   Neck ROM: full    Dental  (+) Poor Dentition   Pulmonary neg pulmonary ROS, former smoker,    Pulmonary exam normal        Cardiovascular Exercise Tolerance: Good negative cardio ROS Normal cardiovascular examAtrial Fibrillation  Rhythm:regular Rate:Normal     Neuro/Psych  Neuromuscular disease negative neurological ROS  negative psych ROS   GI/Hepatic negative GI ROS, Neg liver ROS,   Endo/Other  negative endocrine ROS  Renal/GU negative Renal ROS  negative genitourinary   Musculoskeletal   Abdominal   Peds  Hematology negative hematology ROS (+)   Anesthesia Other Findings Past Medical History: No date: Back pain     Comment:  lower No date: ED (erectile dysfunction) No date: Enlarged prostate No date: Hypogonadism in male No date: Paroxysmal A-fib [facility]) Past Surgical History: 01/13/2018: CARDIOVERSION; N/A     Comment:  Procedure: CARDIOVERSION;  Surgeon: Nelva Bush,               MD;  Location: [place] [facility];  Service: Cardiovascular;                Laterality: N/A; 01/26/2018: CARDIOVERSION; N/A     Comment:  Procedure: CARDIOVERSION;  Surgeon: Minna Merritts,               MD;  Location: [facility];  Service: Cardiovascular;                Laterality: N/A; 03/01/2018: CARDIOVERSION; N/A     Comment:  Procedure: CARDIOVERSION;  Surgeon: Minna Merritts,               MD;  Location: [facility];  Service: Cardiovascular;                Laterality: N/A; 04/2005: COLONOSCOPY No date: TONSILLECTOMY 12/03/2016: TRIGGER FINGER RELEASE; Left     Comment:  Procedure: RELEASE / EXCISION OF THE [place] OF LEFT LITTLE  FINGER;  Surgeon: Corky Mull, MD;  Location: [place];  Service:               Orthopedics;  Laterality: Left; BMI    Body Mass Index:  27.12 kg/m     Reproductive/Obstetrics negative OB ROS                             Anesthesia Physical Anesthesia Plan  ASA: III  Anesthesia Plan: General   Post-op Pain Management:    Induction:   PONV Risk Score and Plan:   Airway Management Planned:   Additional Equipment:   Intra-op Plan:   Post-operative Plan:   Informed Consent: I have reviewed the patients History and Physical, chart, labs and discussed the procedure including the risks, benefits and alternatives for the proposed anesthesia with the patient or authorized representative who has indicated his/her understanding and acceptance.   Dental Advisory Given  Plan Discussed with: CRNA  Anesthesia Plan Comments:         Anesthesia Quick Evaluation

## 2018-05-21 NOTE — Transfer of Care (Signed)
Immediate Anesthesia Transfer of Care Note  Patient: Alexander Carey  Procedure(s) Performed: CARDIOVERSION (N/A )  Patient Location: PACU  Anesthesia Type:General  Level of Consciousness: sedated  Airway & Oxygen Therapy: Patient Spontanous Breathing and Patient connected to nasal cannula oxygen  Post-op Assessment: Report given to RN and Post -op Vital signs reviewed and stable  Post vital signs: Reviewed and stable  Last Vitals:  Vitals Value Taken Time  BP    Temp    Pulse    Resp    SpO2      Last Pain:  Vitals:   05/21/18 0654  TempSrc: Oral  PainSc: 0-No pain         Complications: No apparent anesthesia complications

## 2018-05-24 ENCOUNTER — Encounter: Payer: Self-pay | Admitting: Cardiovascular Disease

## 2018-05-26 ENCOUNTER — Telehealth: Payer: Self-pay

## 2018-05-26 ENCOUNTER — Telehealth: Payer: Self-pay | Admitting: Internal Medicine

## 2018-05-26 DIAGNOSIS — R7989 Other specified abnormal findings of blood chemistry: Secondary | ICD-10-CM

## 2018-05-26 MED ORDER — AMIODARONE HCL 200 MG PO TABS: 200.0000 mg | ORAL_TABLET | Freq: Every day | ORAL | 3 refills | Status: DC

## 2018-05-26 NOTE — Telephone Encounter (Signed)
I left a message on Mrs. Saetern's voice mail that the patient should be on amiodarone 200 mg once daily and that I have sent this RX to [facility].  I advised he should stay on this until he sees Dr. Rayann Heman in consultation for possible a-fib ablation on 06/07/18.  I asked that she call back with any questions.

## 2018-05-26 NOTE — Telephone Encounter (Signed)
Received fax from [facility] for Amiodarone Called and spoke to patient's wife to verify how patient was taking.  She Stated patient has been out since Sunday (05/23/2018)  and he was taking one in the morning and one in the afternoon.  Patients wife stated if he is going to continue taking it please send in a 90 day supply to [facility].

## 2018-05-26 NOTE — Telephone Encounter (Signed)
The patient's wife (ok per DPR) is aware of his most recent lab results from Dr. Caryl Comes.  I have advised her that we will have him go to the lab in [place] when he sees Dr. Rayann Heman on 06/07/18 for a free T3 and T4. Mrs. Alexander Carey understanding and is agreeable.

## 2018-06-07 ENCOUNTER — Encounter: Payer: Self-pay | Admitting: Internal Medicine

## 2018-06-07 ENCOUNTER — Ambulatory Visit: Payer: PPO | Admitting: Internal Medicine

## 2018-06-07 VITALS — BP 114/70 | HR 72 | Ht 72.0 in | Wt 216.6 lb

## 2018-06-07 DIAGNOSIS — G4730 Sleep apnea, unspecified: Secondary | ICD-10-CM | POA: Diagnosis not present

## 2018-06-07 DIAGNOSIS — I4819 Other persistent atrial fibrillation: Secondary | ICD-10-CM

## 2018-06-07 DIAGNOSIS — Z79899 Other long term (current) drug therapy: Secondary | ICD-10-CM

## 2018-06-07 DIAGNOSIS — R55 Syncope and collapse: Secondary | ICD-10-CM

## 2018-06-07 DIAGNOSIS — I481 Persistent atrial fibrillation: Secondary | ICD-10-CM

## 2018-06-07 NOTE — Patient Instructions (Addendum)
Medication Instructions:  Your physician recommends that you continue on your current medications as directed. Please refer to the Current Medication list given to you today.  Labwork: None ordered.  Testing/Procedures: None ordered.  Follow-Up: Your physician wants you to follow-up in: as needed with Dr. Rayann Heman.  If you decide to go ahead with atrial fibrillation ablation please call me or send me a message via MyChart.  Sonia Baller RN [phone]  Any Other Special Instructions Will Be Listed Below (If Applicable).  Referral made to Dr. Ron Parker for oral appliance for sleep apnea  If you need a refill on your cardiac medications before your next appointment, please call your pharmacy.

## 2018-06-07 NOTE — Progress Notes (Signed)
Electrophysiology Office Note   Date:  06/07/2018   ID:  Alexander Carey, DOB 05-19-1951, MRN [id number]  PCP:  Valera Castle, MD  Cardiologist:  Dr Saralyn Pilar Primary Electrophysiologist: Dr Caryl Comes  CC: afib   History of Present Illness: Alexander Carey is a 67 y.o. male who presents today for electrophysiology evaluation.   He has persistent atrial fibrillation. He reports initially being diagnosed with atrial fibrillation about a year ago.  He also has post termination pauses with dizziness.  He has failed medical therapy with flecainide (did not tolerate due to difficulty concentrating).  He also failed medical therapy with rhythmol.  More recently, he has been placed on amiodarone and cardioverted.  He has already returned to afib.  During afib, he has shortness of breath and postural dizziness.  Today, he denies symptoms of palpitations, chest pain,  orthopnea, PND, lower extremity edema, claudication, presyncope, syncope, bleeding, or neurologic sequela. The patient is tolerating medications without difficulties and is otherwise without complaint today.    Past Medical History:  Diagnosis Date  . Back pain    lower  . ED (erectile dysfunction)   . Enlarged prostate   . Hypogonadism in male   . Paroxysmal A-fib [facility])    Past Surgical History:  Procedure Laterality Date  . CARDIOVERSION N/A 01/13/2018   Procedure: CARDIOVERSION;  Surgeon: Nelva Bush, MD;  Location: [facility] ORS;  Service: Cardiovascular;  Laterality: N/A;  . CARDIOVERSION N/A 01/26/2018   Procedure: CARDIOVERSION;  Surgeon: Minna Merritts, MD;  Location: [facility];  Service: Cardiovascular;  Laterality: N/A;  . CARDIOVERSION N/A 03/01/2018   Procedure: CARDIOVERSION;  Surgeon: Minna Merritts, MD;  Location: [facility];  Service: Cardiovascular;  Laterality: N/A;  . CARDIOVERSION N/A 05/21/2018   Procedure: CARDIOVERSION;  Surgeon: Wellington Hampshire, MD;  Location: [facility];  Service: Cardiovascular;   Laterality: N/A;  . COLONOSCOPY  04/2005  . TONSILLECTOMY    . 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 amiodarone (PACERONE) 200 MG tablet Take 1 tablet (200 mg total) by mouth daily. 90 tablet 3  . dutasteride (AVODART) 0.5 MG capsule Take 1 capsule (0.5 mg total) by mouth daily. 90 capsule 3  . ELIQUIS 5 MG TABS tablet Take 5 mg by mouth 2 (two) times daily.     . Ipratropium-Albuterol (COMBIVENT RESPIMAT) 20-100 MCG/ACT AERS respimat Inhale 1 puff into the lungs every 6 (six) hours as needed for wheezing or shortness of breath.     . metoprolol tartrate (LOPRESSOR) 25 MG tablet Take 1.5 tablets (37.5 mg total) by mouth 2 (two) times daily. 270 tablet 3  . tamsulosin (FLOMAX) 0.4 MG CAPS capsule Take 1 capsule (0.4 mg total) by mouth daily. 90 capsule 3  . traMADol (ULTRAM) 50 MG tablet Take 50-100 mg by mouth every 6 (six) hours as needed for pain.     No current facility-administered medications for this visit.     Allergies:   Ciprofloxacin   Social History:  The patient  reports that he quit smoking about 9 years ago. His smoking use included cigarettes. He smoked 0.50 packs per day. He has never used smokeless tobacco. He reports that he drinks about 14.0 standard drinks of alcohol per week. He  reports that he does not use drugs.   Family History:  The patient's family history includes Ovarian cancer in his mother; Throat cancer in his father.    ROS:  Please see the history of present illness.   All other systems are personally reviewed and negative.    PHYSICAL EXAM: VS:  BP 114/70   Pulse 72   Ht 6' (1.829 m)   Wt 216 lb 9.6 oz (98.2 kg)   SpO2 97%   BMI 29.38 kg/m  , BMI Body mass index is 29.38 kg/m. GEN: Well  nourished, well developed, in no acute distress  HEENT: normal  Neck: no JVD, carotid bruits, or masses Cardiac: iRRR; no murmurs, rubs, or gallops,no edema  Respiratory:  clear to auscultation bilaterally, normal work of breathing GI: soft, nontender, nondistended, + BS MS: no deformity or atrophy  Skin: warm and dry  Neuro:  Strength and sensation are intact Psych: euthymic mood, full affect  EKG:  EKG is ordered today. The ekg ordered today is personally reviewed and shows afib, V rate 72 bpm, Qtc 464 msec   Recent Labs: 05/18/2018: ALT 21; BUN 13; Creatinine, Ser 0.92; Hemoglobin 14.7; Platelets 178; Potassium 4.6; Sodium 135; TSH 8.512  personally reviewed   Lipid Panel  No results found for: CHOL, TRIG, HDL, CHOLHDL, VLDL, LDLCALC, LDLDIRECT personally reviewed   Wt Readings from Last 3 Encounters:  06/07/18 216 lb 9.6 oz (98.2 kg)  05/21/18 200 lb (90.7 kg)  04/29/18 210 lb (95.3 kg)      Other studies personally reviewed: Additional studies/ records that were reviewed today include: Dr Aquilla Hacker notes,   Review of the above records today demonstrates: echo 05/01/17 reveals EF 55%, mild mr, tr, LA 38 mm   ASSESSMENT AND PLAN:  1.  Persistent atrial fibrillation The patient has symptomatic, recurrent persistent atrial fibrillation. he has failed medical therapy with flecainide, rhythmol, and amiodarone. Chads2vasc score is 1.  he is anticoagulated with eliquis. Therapeutic strategies for afib including medicine and ablation were discussed in detail with the patient today. Risk, benefits, and alternatives to EP study and radiofrequency ablation for afib were also discussed in detail today.  At this time, he is not sure that he would like to proceed.  He would like to see Dr Arnoldo Morale about his back pain.  He will contact my office if he decides to proceed.  Would plan cardiac CT prior to ablation.  Carto, ICE, and anesthesia would also be required. He is aware that anticipated  success with ablation for him is probably 60-65% and potentially multiple procedures may be required.  2. Post termination pauses Hopefully will resolve with ablation.  If they persist, may require PPM implant down the road.  3. OSA He has diagnosed sleep apnea but does not tolerate CPAP Will refer to Dr Ron Parker for consideration of other treatment modalities.  Follow-up with Dr Caryl Comes as scheduled He will contact my office if he decides to proceed with ablation.  Current medicines are reviewed at length with the patient today.   The patient does not have concerns regarding his medicines.  The following changes were made today:  none  Labs/ tests ordered today include:  Orders Placed This Encounter  Procedures  . Ambulatory referral to Orthodontics  . EKG 12-Lead     Signed, Thompson Grayer, MD  06/07/2018 3:08 PM     [place] [address] [phone] (office) [phone] (fax)

## 2018-06-08 LAB — T3, FREE: T3, Free: 1.9 pg/mL — ABNORMAL LOW (ref 2.0–4.4)

## 2018-06-08 LAB — T4, FREE: FREE T4: 0.86 ng/dL (ref 0.82–1.77)

## 2018-06-15 ENCOUNTER — Telehealth: Payer: Self-pay | Admitting: Internal Medicine

## 2018-06-15 DIAGNOSIS — Z6829 Body mass index (BMI) 29.0-29.9, adult: Secondary | ICD-10-CM | POA: Diagnosis not present

## 2018-06-15 DIAGNOSIS — M4316 Spondylolisthesis, lumbar region: Secondary | ICD-10-CM | POA: Diagnosis not present

## 2018-06-15 DIAGNOSIS — M48062 Spinal stenosis, lumbar region with neurogenic claudication: Secondary | ICD-10-CM | POA: Diagnosis not present

## 2018-06-15 MED ORDER — LEVOTHYROXINE SODIUM 25 MCG PO TABS: 25.0000 ug | ORAL_TABLET | Freq: Every day | ORAL | 1 refills | Status: AC

## 2018-06-15 NOTE — Telephone Encounter (Signed)
Notes recorded by Deboraha Sprang, MD on 06/10/2018 at 6:23 AM EDT Please Inform Patient that thyroid studies is abnormal and will require treatment Will begin synthroid 25 mcg daily with followup with PCP witin month    I called and spoke with the patient's wife regarding his results for his [place]. She is aware the freeT3 is slightly abnormal. I have notified her of Dr. Olin Pia recommendations to start the patient on synthroid 25 mcg once daily and follow up with his PCP in about a month.  Per Mrs. Kawecki, it is ok to go ahead and call in the thyroid medication to [facility]. They are going to see the back surgeon today as well. She is aware that I will forward of copy of the patient's lab studies to Dr. Jefm Bryant.

## 2018-06-24 ENCOUNTER — Telehealth: Payer: Self-pay

## 2018-06-24 DIAGNOSIS — I4819 Other persistent atrial fibrillation: Secondary | ICD-10-CM

## 2018-07-05 NOTE — Telephone Encounter (Signed)
Workup completed

## 2018-07-12 ENCOUNTER — Other Ambulatory Visit: Payer: Self-pay | Admitting: Urology

## 2018-07-13 ENCOUNTER — Other Ambulatory Visit: Payer: PPO

## 2018-07-13 ENCOUNTER — Encounter (INDEPENDENT_AMBULATORY_CARE_PROVIDER_SITE_OTHER): Payer: Self-pay

## 2018-07-13 DIAGNOSIS — M5416 Radiculopathy, lumbar region: Secondary | ICD-10-CM | POA: Diagnosis not present

## 2018-07-13 DIAGNOSIS — I4819 Other persistent atrial fibrillation: Secondary | ICD-10-CM

## 2018-07-13 DIAGNOSIS — M9903 Segmental and somatic dysfunction of lumbar region: Secondary | ICD-10-CM | POA: Diagnosis not present

## 2018-07-13 DIAGNOSIS — M9905 Segmental and somatic dysfunction of pelvic region: Secondary | ICD-10-CM | POA: Diagnosis not present

## 2018-07-13 DIAGNOSIS — M5136 Other intervertebral disc degeneration, lumbar region: Secondary | ICD-10-CM | POA: Diagnosis not present

## 2018-07-13 LAB — CBC WITH DIFFERENTIAL/PLATELET
BASOS: 1 %
Basophils Absolute: 0 10*3/uL (ref 0.0–0.2)
EOS (ABSOLUTE): 0.2 10*3/uL (ref 0.0–0.4)
EOS: 3 %
HEMOGLOBIN: 14.2 g/dL (ref 13.0–17.7)
Hematocrit: 43.4 % (ref 37.5–51.0)
IMMATURE GRANS (ABS): 0 10*3/uL (ref 0.0–0.1)
IMMATURE GRANULOCYTES: 0 %
LYMPHS: 23 %
Lymphocytes Absolute: 1.4 10*3/uL (ref 0.7–3.1)
MCH: 34.7 pg — AB (ref 26.6–33.0)
MCHC: 32.7 g/dL (ref 31.5–35.7)
MCV: 106 fL — AB (ref 79–97)
MONOCYTES: 13 %
MONOS ABS: 0.8 10*3/uL (ref 0.1–0.9)
NEUTROS PCT: 60 %
Neutrophils Absolute: 3.5 10*3/uL (ref 1.4–7.0)
PLATELETS: 188 10*3/uL (ref 150–450)
RBC: 4.09 x10E6/uL — AB (ref 4.14–5.80)
RDW: 12.7 % (ref 12.3–15.4)
WBC: 5.9 10*3/uL (ref 3.4–10.8)

## 2018-07-13 LAB — BASIC METABOLIC PANEL
BUN/Creatinine Ratio: 10 (ref 10–24)
BUN: 12 mg/dL (ref 8–27)
CALCIUM: 9 mg/dL (ref 8.6–10.2)
CO2: 22 mmol/L (ref 20–29)
CREATININE: 1.15 mg/dL (ref 0.76–1.27)
Chloride: 96 mmol/L (ref 96–106)
GFR calc Af Amer: 75 mL/min/{1.73_m2} (ref >59)
GFR, EST NON AFRICAN AMERICAN: 65 mL/min/{1.73_m2} (ref >59)
GLUCOSE: 87 mg/dL (ref 65–99)
Potassium: 4.9 mmol/L (ref 3.5–5.2)
Sodium: 132 mmol/L — ABNORMAL LOW (ref 134–144)

## 2018-07-14 DIAGNOSIS — M5136 Other intervertebral disc degeneration, lumbar region: Secondary | ICD-10-CM | POA: Diagnosis not present

## 2018-07-14 DIAGNOSIS — M9905 Segmental and somatic dysfunction of pelvic region: Secondary | ICD-10-CM | POA: Diagnosis not present

## 2018-07-14 DIAGNOSIS — M9903 Segmental and somatic dysfunction of lumbar region: Secondary | ICD-10-CM | POA: Diagnosis not present

## 2018-07-14 DIAGNOSIS — M5416 Radiculopathy, lumbar region: Secondary | ICD-10-CM | POA: Diagnosis not present

## 2018-07-15 DIAGNOSIS — M9905 Segmental and somatic dysfunction of pelvic region: Secondary | ICD-10-CM | POA: Diagnosis not present

## 2018-07-15 DIAGNOSIS — M5416 Radiculopathy, lumbar region: Secondary | ICD-10-CM | POA: Diagnosis not present

## 2018-07-15 DIAGNOSIS — M9903 Segmental and somatic dysfunction of lumbar region: Secondary | ICD-10-CM | POA: Diagnosis not present

## 2018-07-15 DIAGNOSIS — G4733 Obstructive sleep apnea (adult) (pediatric): Secondary | ICD-10-CM | POA: Diagnosis not present

## 2018-07-15 DIAGNOSIS — M5136 Other intervertebral disc degeneration, lumbar region: Secondary | ICD-10-CM | POA: Diagnosis not present

## 2018-07-19 DIAGNOSIS — M5136 Other intervertebral disc degeneration, lumbar region: Secondary | ICD-10-CM | POA: Diagnosis not present

## 2018-07-19 DIAGNOSIS — M5416 Radiculopathy, lumbar region: Secondary | ICD-10-CM | POA: Diagnosis not present

## 2018-07-19 DIAGNOSIS — M9903 Segmental and somatic dysfunction of lumbar region: Secondary | ICD-10-CM | POA: Diagnosis not present

## 2018-07-19 DIAGNOSIS — M9905 Segmental and somatic dysfunction of pelvic region: Secondary | ICD-10-CM | POA: Diagnosis not present

## 2018-07-21 ENCOUNTER — Ambulatory Visit (HOSPITAL_COMMUNITY)
Admission: RE | Admit: 2018-07-21 | Discharge: 2018-07-21 | Disposition: A | Discharge status: Home / Self Care | Payer: PPO | Source: Ambulatory Visit | Attending: Internal Medicine | Admitting: Internal Medicine

## 2018-07-21 ENCOUNTER — Ambulatory Visit (HOSPITAL_COMMUNITY): Payer: PPO

## 2018-07-21 DIAGNOSIS — I700 Atherosclerosis of aorta: Secondary | ICD-10-CM | POA: Insufficient documentation

## 2018-07-21 DIAGNOSIS — M5136 Other intervertebral disc degeneration, lumbar region: Secondary | ICD-10-CM | POA: Diagnosis not present

## 2018-07-21 DIAGNOSIS — I4819 Other persistent atrial fibrillation: Secondary | ICD-10-CM | POA: Insufficient documentation

## 2018-07-21 DIAGNOSIS — M9905 Segmental and somatic dysfunction of pelvic region: Secondary | ICD-10-CM | POA: Diagnosis not present

## 2018-07-21 DIAGNOSIS — M5416 Radiculopathy, lumbar region: Secondary | ICD-10-CM | POA: Diagnosis not present

## 2018-07-21 DIAGNOSIS — M9903 Segmental and somatic dysfunction of lumbar region: Secondary | ICD-10-CM | POA: Diagnosis not present

## 2018-07-21 MED ORDER — METOPROLOL TARTRATE 5 MG/5ML IV SOLN
5.0000 mg | INTRAVENOUS | Status: DC | PRN
  Administered 2018-07-21 (×2): 5 mg via INTRAVENOUS
  Filled 2018-07-21: qty 5

## 2018-07-21 MED ORDER — METOPROLOL TARTRATE 5 MG/5ML IV SOLN
INTRAVENOUS | Status: AC
  Filled 2018-07-21: qty 20

## 2018-07-21 MED ORDER — METOPROLOL TARTRATE 5 MG/5ML IV SOLN
5.0000 mg | INTRAVENOUS | Status: DC | PRN
  Filled 2018-07-21: qty 5

## 2018-07-21 MED ORDER — IOPAMIDOL (ISOVUE-370) INJECTION 76%
75.0000 mL | Freq: Once | INTRAVENOUS | Status: AC | PRN
  Administered 2018-07-21: 75 mL via INTRAVENOUS

## 2018-07-21 NOTE — Progress Notes (Signed)
CT scan completed. Tolerated well. D/c home walking with wife, awake and alert. In no distress.

## 2018-07-23 DIAGNOSIS — M5136 Other intervertebral disc degeneration, lumbar region: Secondary | ICD-10-CM | POA: Diagnosis not present

## 2018-07-23 DIAGNOSIS — M9905 Segmental and somatic dysfunction of pelvic region: Secondary | ICD-10-CM | POA: Diagnosis not present

## 2018-07-23 DIAGNOSIS — M5416 Radiculopathy, lumbar region: Secondary | ICD-10-CM | POA: Diagnosis not present

## 2018-07-23 DIAGNOSIS — M9903 Segmental and somatic dysfunction of lumbar region: Secondary | ICD-10-CM | POA: Diagnosis not present

## 2018-07-26 DIAGNOSIS — M5136 Other intervertebral disc degeneration, lumbar region: Secondary | ICD-10-CM | POA: Diagnosis not present

## 2018-07-26 DIAGNOSIS — M9903 Segmental and somatic dysfunction of lumbar region: Secondary | ICD-10-CM | POA: Diagnosis not present

## 2018-07-26 DIAGNOSIS — M9905 Segmental and somatic dysfunction of pelvic region: Secondary | ICD-10-CM | POA: Diagnosis not present

## 2018-07-26 DIAGNOSIS — M5416 Radiculopathy, lumbar region: Secondary | ICD-10-CM | POA: Diagnosis not present

## 2018-07-27 ENCOUNTER — Other Ambulatory Visit: Payer: Self-pay

## 2018-07-27 ENCOUNTER — Ambulatory Visit (HOSPITAL_COMMUNITY)
Admission: RE | Admit: 2018-07-27 | Discharge: 2018-07-27 | Disposition: A | Discharge status: Home / Self Care | Payer: PPO | Source: Ambulatory Visit | Attending: Internal Medicine | Admitting: Internal Medicine

## 2018-07-27 ENCOUNTER — Encounter (HOSPITAL_COMMUNITY)
Admission: RE | Disposition: A | Discharge status: Home / Self Care | Payer: Self-pay | Source: Ambulatory Visit | Attending: Internal Medicine

## 2018-07-27 ENCOUNTER — Ambulatory Visit (HOSPITAL_COMMUNITY): Payer: PPO | Admitting: Certified Registered Nurse Anesthetist

## 2018-07-27 DIAGNOSIS — Z87891 Personal history of nicotine dependence: Secondary | ICD-10-CM | POA: Diagnosis not present

## 2018-07-27 DIAGNOSIS — I4891 Unspecified atrial fibrillation: Secondary | ICD-10-CM | POA: Diagnosis not present

## 2018-07-27 DIAGNOSIS — Z7901 Long term (current) use of anticoagulants: Secondary | ICD-10-CM | POA: Insufficient documentation

## 2018-07-27 DIAGNOSIS — I4819 Other persistent atrial fibrillation: Secondary | ICD-10-CM | POA: Diagnosis not present

## 2018-07-27 DIAGNOSIS — E039 Hypothyroidism, unspecified: Secondary | ICD-10-CM | POA: Diagnosis not present

## 2018-07-27 HISTORY — PX: ATRIAL FIBRILLATION ABLATION: EP1191

## 2018-07-27 LAB — POCT ACTIVATED CLOTTING TIME
ACTIVATED CLOTTING TIME: 197 s
ACTIVATED CLOTTING TIME: 191 s

## 2018-07-27 SURGERY — ATRIAL FIBRILLATION ABLATION
Anesthesia: General

## 2018-07-27 MED ORDER — PROTAMINE SULFATE 10 MG/ML IV SOLN
INTRAVENOUS | Status: DC | PRN
  Administered 2018-07-27: 10 mg via INTRAVENOUS
  Administered 2018-07-27: 20 mg via INTRAVENOUS
  Administered 2018-07-27: 10 mg via INTRAVENOUS

## 2018-07-27 MED ORDER — ONDANSETRON HCL 4 MG/2ML IJ SOLN: 4.0000 mg | Freq: Four times a day (QID) | INTRAMUSCULAR | Status: DC | PRN

## 2018-07-27 MED ORDER — BUPIVACAINE HCL (PF) 0.25 % IJ SOLN
INTRAMUSCULAR | Status: AC
  Filled 2018-07-27: qty 30

## 2018-07-27 MED ORDER — BUPIVACAINE HCL (PF) 0.25 % IJ SOLN
INTRAMUSCULAR | Status: DC | PRN
  Administered 2018-07-27: 30 mL

## 2018-07-27 MED ORDER — SODIUM CHLORIDE 0.9% FLUSH: 3.0000 mL | INTRAVENOUS | Status: DC | PRN

## 2018-07-27 MED ORDER — SODIUM CHLORIDE 0.9 % IV SOLN: 250.0000 mL | INTRAVENOUS | Status: DC | PRN

## 2018-07-27 MED ORDER — ACETAMINOPHEN 325 MG PO TABS: 650.0000 mg | ORAL_TABLET | ORAL | Status: DC | PRN

## 2018-07-27 MED ORDER — PANTOPRAZOLE SODIUM 40 MG PO TBEC: 40.0000 mg | DELAYED_RELEASE_TABLET | Freq: Every day | ORAL | 0 refills | Status: DC

## 2018-07-27 MED ORDER — HEPARIN SODIUM (PORCINE) 1000 UNIT/ML IJ SOLN
INTRAMUSCULAR | Status: AC
  Filled 2018-07-27: qty 1

## 2018-07-27 MED ORDER — HYDROCODONE-ACETAMINOPHEN 5-325 MG PO TABS: 1.0000 | ORAL_TABLET | ORAL | Status: DC | PRN

## 2018-07-27 MED ORDER — MIDAZOLAM HCL 2 MG/2ML IJ SOLN
INTRAMUSCULAR | Status: DC | PRN
  Administered 2018-07-27: 2 mg via INTRAVENOUS

## 2018-07-27 MED ORDER — DEXAMETHASONE SODIUM PHOSPHATE 10 MG/ML IJ SOLN
INTRAMUSCULAR | Status: DC | PRN
  Administered 2018-07-27: 10 mg via INTRAVENOUS

## 2018-07-27 MED ORDER — HEPARIN SODIUM (PORCINE) 1000 UNIT/ML IJ SOLN
INTRAMUSCULAR | Status: DC | PRN
  Administered 2018-07-27: 12000 [IU] via INTRAVENOUS
  Administered 2018-07-27 (×2): 1000 [IU] via INTRAVENOUS

## 2018-07-27 MED ORDER — HEPARIN (PORCINE) IN NACL 1000-0.9 UT/500ML-% IV SOLN
INTRAVENOUS | Status: AC
  Filled 2018-07-27: qty 500

## 2018-07-27 MED ORDER — ROCURONIUM BROMIDE 50 MG/5ML IV SOSY
PREFILLED_SYRINGE | INTRAVENOUS | Status: DC | PRN
  Administered 2018-07-27: 20 mg via INTRAVENOUS
  Administered 2018-07-27: 50 mg via INTRAVENOUS
  Administered 2018-07-27: 10 mg via INTRAVENOUS

## 2018-07-27 MED ORDER — LIDOCAINE 2% (20 MG/ML) 5 ML SYRINGE
INTRAMUSCULAR | Status: DC | PRN
  Administered 2018-07-27: 60 mg via INTRAVENOUS

## 2018-07-27 MED ORDER — HEPARIN (PORCINE) IN NACL 1000-0.9 UT/500ML-% IV SOLN
INTRAVENOUS | Status: DC | PRN
  Administered 2018-07-27: 500 mL

## 2018-07-27 MED ORDER — SUGAMMADEX SODIUM 200 MG/2ML IV SOLN
INTRAVENOUS | Status: DC | PRN
  Administered 2018-07-27: 186 mg via INTRAVENOUS

## 2018-07-27 MED ORDER — FENTANYL CITRATE (PF) 250 MCG/5ML IJ SOLN
INTRAMUSCULAR | Status: DC | PRN
  Administered 2018-07-27: 100 ug via INTRAVENOUS

## 2018-07-27 MED ORDER — SODIUM CHLORIDE 0.9 % IV SOLN
INTRAVENOUS | Status: DC | PRN
  Administered 2018-07-27: 45 ug/min via INTRAVENOUS

## 2018-07-27 MED ORDER — PHENYLEPHRINE HCL 10 MG/ML IJ SOLN
INTRAMUSCULAR | Status: DC | PRN
  Administered 2018-07-27 (×2): 40 ug via INTRAVENOUS
  Administered 2018-07-27: 80 ug via INTRAVENOUS
  Administered 2018-07-27: 40 ug via INTRAVENOUS
  Administered 2018-07-27: 80 ug via INTRAVENOUS
  Administered 2018-07-27: 40 ug via INTRAVENOUS

## 2018-07-27 MED ORDER — HEPARIN SODIUM (PORCINE) 1000 UNIT/ML IJ SOLN
INTRAMUSCULAR | Status: DC | PRN
  Administered 2018-07-27 (×3): 5000 [IU] via INTRAVENOUS

## 2018-07-27 MED ORDER — PROPOFOL 10 MG/ML IV BOLUS
INTRAVENOUS | Status: DC | PRN
  Administered 2018-07-27: 140 mg via INTRAVENOUS

## 2018-07-27 MED ORDER — SODIUM CHLORIDE 0.9% FLUSH: 3.0000 mL | Freq: Two times a day (BID) | INTRAVENOUS | Status: DC

## 2018-07-27 MED ORDER — SODIUM CHLORIDE 0.9 % IV SOLN
INTRAVENOUS | Status: DC
  Administered 2018-07-27 (×2): via INTRAVENOUS

## 2018-07-27 MED ORDER — ONDANSETRON HCL 4 MG/2ML IJ SOLN
INTRAMUSCULAR | Status: DC | PRN
  Administered 2018-07-27: 4 mg via INTRAVENOUS

## 2018-07-27 SURGICAL SUPPLY — 17 items
BLANKET WARM UNDERBOD FULL ACC (MISCELLANEOUS) ×3 IMPLANT
CATH MAPPNG PENTARAY F 2-6-2MM (CATHETERS) ×1 IMPLANT
CATH NAVISTAR SMARTTOUCH DF (ABLATOR) ×3 IMPLANT
CATH SOUNDSTAR ECO REPROCESSED (CATHETERS) ×3 IMPLANT
CATH WEBSTER BI DIR CS D-F CRV (CATHETERS) ×3 IMPLANT
COVER SWIFTLINK CONNECTOR (BAG) ×3 IMPLANT
NEEDLE BAYLIS TRANSSEPTAL 71CM (NEEDLE) ×3 IMPLANT
PACK EP LATEX FREE (CUSTOM PROCEDURE TRAY) ×2
PACK EP LF (CUSTOM PROCEDURE TRAY) ×1 IMPLANT
PAD PRO RADIOLUCENT 2001M-C (PAD) ×3 IMPLANT
PATCH CARTO3 (PAD) ×3 IMPLANT
PENTARAY F 2-6-2MM (CATHETERS) ×3
SHEATH AVANTI 11F 11CM (SHEATH) ×3 IMPLANT
SHEATH PINNACLE 7F 10CM (SHEATH) ×6 IMPLANT
SHEATH PINNACLE 9F 10CM (SHEATH) ×3 IMPLANT
SHEATH SWARTZ TS SL2 63CM 8.5F (SHEATH) ×3 IMPLANT
TUBING SMART ABLATE COOLFLOW (TUBING) ×3 IMPLANT

## 2018-07-27 NOTE — Anesthesia Preprocedure Evaluation (Addendum)
Anesthesia Evaluation    Reviewed: Allergy & Precautions, NPO status , Patient's Chart, lab work & pertinent test results, reviewed documented beta blocker date and time   History of Anesthesia Complications Negative for: history of anesthetic complications  Airway Mallampati: III  TM Distance: >3 FB Neck ROM: Full    Dental  (+) Teeth Intact   Pulmonary former smoker,    breath sounds clear to auscultation       Cardiovascular + dysrhythmias Atrial Fibrillation  Rhythm:Irregular     Neuro/Psych  Neuromuscular disease    GI/Hepatic negative GI ROS, Neg liver ROS,   Endo/Other  Hypothyroidism   Renal/GU negative Renal ROS     Musculoskeletal  (+) Arthritis ,   Abdominal   Peds  Hematology On Eliquis   Anesthesia Other Findings 2019 stress: The patient exercised following the Bruce protocol.  The patient reported dyspnea during the stress test. The patient experienced no angina during the stress test.   The test was stopped because the patient complained of shortness of breath.   Blood pressure demonstrated a normal response to exercise. Blood pressure demonstrated a normal response to exercise. 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 Overall, the patient's exercise capacity was mildly impaired.   The patient's response to exercise was adequate for diagnosis.  Reproductive/Obstetrics                            Anesthesia Physical Anesthesia Plan  ASA: III  Anesthesia Plan: General   Post-op Pain Management:    Induction: Intravenous  PONV Risk Score and Plan: 2 and Ondansetron and Dexamethasone  Airway Management Planned: Oral ETT  Additional Equipment: None  Intra-op Plan:   Post-operative Plan: Extubation in OR  Informed Consent: I have reviewed the patients History and Physical, chart, labs and discussed the procedure including the risks, benefits and alternatives  for the proposed anesthesia with the patient or authorized representative who has indicated his/her understanding and acceptance.   Dental advisory given  Plan Discussed with: CRNA and Surgeon  Anesthesia Plan Comments:         Anesthesia Quick Evaluation

## 2018-07-27 NOTE — Progress Notes (Signed)
Site area: rt groin fv sheaths x3 Site Prior to Removal:  Level 0 Pressure Applied For: 20 minutes Manual:   yes Patient Status During Pull:  stable Post Pull Site:  Level  0 Post Pull Instructions Given:  yes Post Pull Pulses Present:  Rt dp palpable Dressing Applied:  Gauze and tegaderm Bedrest begins @ 1027 Comments:  IV saline locked

## 2018-07-27 NOTE — Discharge Instructions (Signed)
Post procedure care instructions No driving for 4 days. No lifting over 5 lbs for 1 week. No vigorous or sexual activity for 1 week. You may return to work on 08/03/18. Keep procedure site clean & dry. If you notice increased pain, swelling, bleeding or pus, call/return!  You may shower, but no soaking baths/hot tubs/pools for 1 week.   You have an appointment set up with the [facility].  Multiple studies have shown that being followed by a dedicated atrial fibrillation clinic in addition to the standard care you receive from your other physicians improves health. We believe that enrollment in the atrial fibrillation clinic will allow [place] to better care for you.   The phone number to the [facility] is [phone]. The clinic is staffed Monday through Friday from 8:30am to 5pm.  Parking Directions: The clinic is located in the Heart and Vascular Building connected to [facility]. 1)From [address] turn on to [address] and go to the 3rd entrance  (Heart and Vascular entrance) on the right. 2)Look to the right for Heart &Vascular Parking Garage. 3)A code for the entrance is required please call the clinic to receive this.   4)Take the elevators to the 1st floor. Registration is in the room with the glass walls at the end of the hallway.  If you have any trouble parking or locating the clinic, please dont hesitate to call [phone].   Femoral Site Care Refer to this sheet in the next few weeks. These instructions provide you with information about caring for yourself after your procedure. Your health care provider may also give you more specific instructions. Your treatment has been planned according to current medical practices, but problems sometimes occur. Call your health care provider if you have any problems or questions after your procedure. What can I expect after the procedure? After your procedure, it is typical to have the  following:  Bruising at the site that usually fades within 1-2 weeks.  Blood collecting in the tissue (hematoma) that may be painful to the touch. It should usually decrease in size and tenderness within 1-2 weeks.  Follow these instructions at home:  Take medicines only as directed by your health care provider.  You may shower 24-48 hours after the procedure or as directed by your health care provider. Remove the bandage (dressing) and gently wash the site with plain soap and water. Pat the area dry with a clean towel. Do not rub the site, because this may cause bleeding.  Do not take baths, swim, or use a hot tub until your health care provider approves.  Check your insertion site every day for redness, swelling, or drainage.  Do not apply powder or lotion to the site.  Limit use of stairs to twice a day for the first 2-3 days or as directed by your health care provider.  Do not squat for the first 2-3 days or as directed by your health care provider.  Do not lift over 10 lb (4.5 kg) for 5 days after your procedure or as directed by your health care provider.  Ask your health care provider when it is okay to: ? Return to work or school. ? Resume usual physical activities or sports. ? Resume sexual activity.  Do not drive home if you are discharged the same day as the procedure. Have someone else drive you.  You may drive 24 hours after the procedure unless otherwise instructed by your health care provider.  Do  not operate machinery or power tools for 24 hours after the procedure or as directed by your health care provider.  If your procedure was done as an outpatient procedure, which means that you went home the same day as your procedure, a responsible adult should be with you for the first 24 hours after you arrive home.  Keep all follow-up visits as directed by your health care provider. This is important. Contact a health care provider if:  You have a fever.  You have  chills.  You have increased bleeding from the site. Hold pressure on the site. Get help right away if:  You have unusual pain at the site.  You have redness, warmth, or swelling at the site.  You have drainage (other than a small amount of blood on the dressing) from the site.  The site is bleeding, and the bleeding does not stop after 30 minutes of holding steady pressure on the site.  Your leg or foot becomes pale, cool, tingly, or numb. This information is not intended to replace advice given to you by your health care provider. Make sure you discuss any questions you have with your health care provider. Document Released: 05/05/2014 Document Revised: 02/07/2016 Document Reviewed: 03/21/2014 Elsevier Interactive Patient Education  [employer].

## 2018-07-27 NOTE — Progress Notes (Signed)
RN ambulated with patient to bathroom and in hallway.  No hematoma or bleeding noted.

## 2018-07-27 NOTE — Anesthesia Procedure Notes (Signed)
Procedure Name: Intubation Date/Time: 07/27/2018 10:45 AM Performed by: Kathryne Hitch, CRNA Pre-anesthesia Checklist: Patient identified, Emergency Drugs available, Suction available and Patient being monitored Patient Re-evaluated:Patient Re-evaluated prior to induction Oxygen Delivery Method: Circle system utilized Preoxygenation: Pre-oxygenation with 100% oxygen Induction Type: IV induction Ventilation: Mask ventilation without difficulty Laryngoscope Size: Miller and 2 Grade View: Grade II Tube type: Oral Tube size: 7.5 mm Number of attempts: 1 Placement Confirmation: ETT inserted through vocal cords under direct vision,  positive ETCO2 and breath sounds checked- equal and bilateral Secured at: 23 cm Tube secured with: Tape Dental Injury: Teeth and Oropharynx as per pre-operative assessment

## 2018-07-27 NOTE — H&P (Signed)
PCP:  Valera Castle, MD     Cardiologist:  Dr Saralyn Pilar Primary Electrophysiologist: Dr Caryl Comes  CC: afib   History of Present Illness: Alexander Carey is a 67 y.o. male who presents today for electrophysiology study and ablation for atrial fibrillation.   He has persistent atrial fibrillation. He reports initially being diagnosed with atrial fibrillation about a year ago.  He also has post termination pauses with dizziness.  He has failed medical therapy with flecainide (did not tolerate due to difficulty concentrating).  He also failed medical therapy with rhythmol.  More recently, he has been placed on amiodarone and cardioverted.  He has already returned to afib.  During afib, he has shortness of breath and postural dizziness.  Today, he denies symptoms of palpitations, chest pain,  orthopnea, PND, lower extremity edema, claudication, presyncope, syncope, bleeding, or neurologic sequela. The patient is tolerating medications without difficulties and is otherwise without complaint today.        Past Medical History:  Diagnosis Date  . Back pain    lower  . ED (erectile dysfunction)   . Enlarged prostate   . Hypogonadism in male   . Paroxysmal A-fib [facility])         Past Surgical History:  Procedure Laterality Date  . CARDIOVERSION N/A 01/13/2018   Procedure: CARDIOVERSION;  Surgeon: Nelva Bush, MD;  Location: [facility];  Service: Cardiovascular;  Laterality: N/A;  . CARDIOVERSION N/A 01/26/2018   Procedure: CARDIOVERSION;  Surgeon: Minna Merritts, MD;  Location: [facility];  Service: Cardiovascular;  Laterality: N/A;  . CARDIOVERSION N/A 03/01/2018   Procedure: CARDIOVERSION;  Surgeon: Minna Merritts, MD;  Location: [facility];  Service: Cardiovascular;  Laterality: N/A;  . CARDIOVERSION N/A 05/21/2018   Procedure: CARDIOVERSION;  Surgeon: Wellington Hampshire, MD;  Location: [facility];  Service: Cardiovascular;  Laterality: N/A;  . COLONOSCOPY  04/2005  .  TONSILLECTOMY    . 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 amiodarone (PACERONE) 200 MG tablet Take 1 tablet (200 mg total) by mouth daily. 90 tablet 3  . dutasteride (AVODART) 0.5 MG capsule Take 1 capsule (0.5 mg total) by mouth daily. 90 capsule 3  . ELIQUIS 5 MG TABS tablet Take 5 mg by mouth 2 (two) times daily.     . Ipratropium-Albuterol (COMBIVENT RESPIMAT) 20-100 MCG/ACT AERS respimat Inhale 1 puff into the lungs every 6 (six) hours as needed for wheezing or shortness of breath.     . metoprolol tartrate (LOPRESSOR) 25 MG tablet Take 1.5 tablets (37.5 mg total) by mouth 2 (two) times daily. 270 tablet 3  . tamsulosin (FLOMAX) 0.4 MG CAPS capsule Take 1 capsule (0.4 mg total) by mouth daily. 90 capsule 3  . traMADol (ULTRAM) 50 MG tablet Take 50-100 mg by mouth every 6 (six) hours as needed for pain.     No current facility-administered medications for this visit.     Allergies:   Ciprofloxacin   Social History:  The patient  reports that he quit smoking about 9 years ago. His smoking use included cigarettes. He smoked 0.50 packs per day. He has never used smokeless tobacco. He reports that he drinks about 14.0 standard drinks of alcohol per  week. He reports that he does not use drugs.   Family History:  The patient's family history includes Ovarian cancer in his mother; Throat cancer in his father.    ROS:  Please see the history of present illness.   All other systems are personally reviewed and negative.    PHYSICAL EXAM: Vitals:   07/27/18 0858  BP: (!) 148/92  Pulse: 79  Resp: 18  Temp: 97.7 F (36.5 C)  SpO2: (!) 20%  GEN: Well nourished, well developed, in no acute distress  HEENT:  normal  Neck: no JVD, carotid bruits, or masses Cardiac: iRRR; no murmurs, rubs, or gallops,no edema  Respiratory:  clear to auscultation bilaterally, normal work of breathing GI: soft, nontender, nondistended, + BS MS: no deformity or atrophy  Skin: warm and dry  Neuro:  Strength and sensation are intact Psych: euthymic mood, full affect  EKG:  EKG is ordered today. The ekg ordered today is personally reviewed and shows afib, V rate 72 bpm, Qtc 464 msec   Recent Labs: 05/18/2018: ALT 21; BUN 13; Creatinine, Ser 0.92; Hemoglobin 14.7; Platelets 178; Potassium 4.6; Sodium 135; TSH 8.512  personally reviewed   Lipid Panel  Labs(Brief)  No results found for: CHOL, TRIG, HDL, CHOLHDL, VLDL, LDLCALC, LDLDIRECT   personally reviewed      Wt Readings from Last 3 Encounters:  06/07/18 216 lb 9.6 oz (98.2 kg)  05/21/18 200 lb (90.7 kg)  04/29/18 210 lb (95.3 kg)      Other studies personally reviewed: Additional studies/ records that were reviewed today include: Dr Aquilla Hacker notes,   Review of the above records today demonstrates: echo 05/01/17 reveals EF 55%, mild mr, tr, LA 38 mm   ASSESSMENT AND PLAN:  1.  Persistent atrial fibrillation The patient has symptomatic, recurrent persistent atrial fibrillation. he has failed medical therapy with flecainide, rhythmol, and amiodarone. Chads2vasc score is 1.  he is anticoagulated with eliquis.  Risk, benefits, and alternatives to EP study and radiofrequency ablation for afib were also discussed in detail today. These risks include but are not limited to stroke, bleeding, vascular damage, tamponade, perforation, damage to the esophagus, lungs, and other structures, pulmonary vein stenosis, worsening renal function, and death. The patient understands these risk and wishes to proceed.  We will therefore proceed with catheter ablation.  Cardiac CT reviewed with patient in detail today.  He reports compliance with eliquis without  interruption.  2. Post termination pauses Hopefully will resolve with ablation.  If they persist, may require PPM implant down the road.  Thompson Grayer MD, [facility] 07/27/2018 10:08 AM

## 2018-07-27 NOTE — Transfer of Care (Signed)
Immediate Anesthesia Transfer of Care Note  Patient: Alexander Carey  Procedure(s) Performed: ATRIAL FIBRILLATION ABLATION (N/A )  Patient Location: PACU  Anesthesia Type:General  Level of Consciousness: drowsy and patient cooperative  Airway & Oxygen Therapy: Patient Spontanous Breathing and Patient connected to face mask oxygen  Post-op Assessment: Report given to RN, Post -op Vital signs reviewed and stable and Patient moving all extremities  Post vital signs: Reviewed and stable  Last Vitals:  Vitals Value Taken Time  BP    Temp    Pulse    Resp    SpO2      Last Pain:  Vitals:   07/27/18 0916  TempSrc:   PainSc: 0-No pain      Patients Stated Pain Goal: 5 (56/94/37 0052)  Complications: No apparent anesthesia complications

## 2018-07-27 NOTE — Progress Notes (Signed)
ACT 191. OK per Dr. Rayann Heman to pull sheaths

## 2018-07-27 NOTE — Anesthesia Postprocedure Evaluation (Signed)
Anesthesia Post Note  Patient: Alexander Carey  Procedure(s) Performed: ATRIAL FIBRILLATION ABLATION (N/A )     Patient location during evaluation: PACU Anesthesia Type: General Level of consciousness: awake and alert Pain management: pain level controlled Vital Signs Assessment: post-procedure vital signs reviewed and stable Respiratory status: spontaneous breathing, nonlabored ventilation, respiratory function stable and patient connected to nasal cannula oxygen Cardiovascular status: blood pressure returned to baseline and stable Postop Assessment: no apparent nausea or vomiting Anesthetic complications: no    Last Vitals:  Vitals:   07/27/18 1550 07/27/18 1604  BP: (!) 154/89 (!) 173/95  Pulse: 66 72  Resp: 14 15  Temp:  36.4 C  SpO2: 98% 98%    Last Pain:  Vitals:   07/27/18 1604  TempSrc: Oral  PainSc: 0-No pain                 Kalif Kattner S

## 2018-07-28 ENCOUNTER — Encounter (HOSPITAL_COMMUNITY): Payer: Self-pay | Admitting: Internal Medicine

## 2018-07-28 LAB — POCT ACTIVATED CLOTTING TIME
ACTIVATED CLOTTING TIME: 246 s
Activated Clotting Time: 290 seconds
Activated Clotting Time: 362 seconds
Activated Clotting Time: 395 seconds

## 2018-08-16 DIAGNOSIS — R221 Localized swelling, mass and lump, neck: Secondary | ICD-10-CM | POA: Diagnosis not present

## 2018-08-16 DIAGNOSIS — E039 Hypothyroidism, unspecified: Secondary | ICD-10-CM | POA: Diagnosis not present

## 2018-08-16 DIAGNOSIS — E785 Hyperlipidemia, unspecified: Secondary | ICD-10-CM | POA: Diagnosis not present

## 2018-08-23 ENCOUNTER — Other Ambulatory Visit: Payer: Self-pay | Admitting: Family Medicine

## 2018-08-23 DIAGNOSIS — R221 Localized swelling, mass and lump, neck: Secondary | ICD-10-CM

## 2018-08-26 ENCOUNTER — Encounter (HOSPITAL_COMMUNITY): Payer: Self-pay | Admitting: Nurse Practitioner

## 2018-08-26 ENCOUNTER — Ambulatory Visit (HOSPITAL_COMMUNITY)
Admission: RE | Admit: 2018-08-26 | Discharge: 2018-08-26 | Disposition: A | Discharge status: Home / Self Care | Payer: PPO | Source: Ambulatory Visit | Attending: Nurse Practitioner | Admitting: Nurse Practitioner

## 2018-08-26 VITALS — BP 136/74 | HR 65 | Ht 71.0 in | Wt 219.0 lb

## 2018-08-26 DIAGNOSIS — Z79899 Other long term (current) drug therapy: Secondary | ICD-10-CM | POA: Diagnosis not present

## 2018-08-26 DIAGNOSIS — Z7901 Long term (current) use of anticoagulants: Secondary | ICD-10-CM | POA: Insufficient documentation

## 2018-08-26 DIAGNOSIS — I4819 Other persistent atrial fibrillation: Secondary | ICD-10-CM | POA: Diagnosis not present

## 2018-08-26 NOTE — Progress Notes (Signed)
Primary Care Physician: Valera Castle, MD Referring Physician: Dr. Camille Bal is a 67 y.o. male with a h/o persistent afib with termination pauses, failing flecainide, Rythmol and currently on  Amiodarone. He is in the afib clinic for one month follow up from ablation. He is in SR. Has not noted any afib since the procedure.  No swallowing or groin  issues.   Today, he denies symptoms of palpitations, chest pain, shortness of breath, orthopnea, PND, lower extremity edema, dizziness, presyncope, syncope, or neurologic sequela. The patient is tolerating medications without difficulties and is otherwise without complaint today.   Past Medical History:  Diagnosis Date  . Back pain    lower  . ED (erectile dysfunction)   . Enlarged prostate   . Hypogonadism in male   . Paroxysmal A-fib [facility])    Past Surgical History:  Procedure Laterality Date  . ATRIAL FIBRILLATION ABLATION N/A 07/27/2018   Procedure: ATRIAL FIBRILLATION ABLATION;  Surgeon: Thompson Grayer, MD;  Location: [place] CV LAB;  Service: Cardiovascular;  Laterality: N/A;  . CARDIOVERSION N/A 01/13/2018   Procedure: CARDIOVERSION;  Surgeon: Nelva Bush, MD;  Location: [place] [facility];  Service: Cardiovascular;  Laterality: N/A;  . CARDIOVERSION N/A 01/26/2018   Procedure: CARDIOVERSION;  Surgeon: Minna Merritts, MD;  Location: [facility];  Service: Cardiovascular;  Laterality: N/A;  . CARDIOVERSION N/A 03/01/2018   Procedure: CARDIOVERSION;  Surgeon: Minna Merritts, MD;  Location: [facility];  Service: Cardiovascular;  Laterality: N/A;  . CARDIOVERSION N/A 05/21/2018   Procedure: CARDIOVERSION;  Surgeon: Wellington Hampshire, MD;  Location: [facility];  Service: Cardiovascular;  Laterality: N/A;  . COLONOSCOPY  04/2005  . TONSILLECTOMY    . 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 acetaminophen (TYLENOL) 650 MG CR tablet Take 650 mg by mouth every 8 (eight) hours as needed for pain.    Marland Kitchen amiodarone (PACERONE) 200 MG tablet Take 1 tablet (200 mg total) by mouth daily. 90 tablet 3  . dutasteride (AVODART) 0.5 MG capsule Take 1 capsule (0.5 mg total) by mouth daily. 90 capsule 3  . ELIQUIS 5 MG TABS tablet Take 5 mg by mouth 2 (two) times daily.     . Ipratropium-Albuterol (COMBIVENT RESPIMAT) 20-100 MCG/ACT AERS respimat Inhale 1 puff into the lungs every 6 (six) hours as needed for wheezing or shortness of breath.     . levothyroxine (SYNTHROID, LEVOTHROID) 25 MCG tablet Take 1 tablet (25 mcg total) by mouth daily before breakfast. 30 tablet 1  . metoprolol tartrate (LOPRESSOR) 25 MG tablet Take 1.5 tablets (37.5 mg total) by mouth 2 (two) times daily. 270 tablet 3  . pantoprazole (PROTONIX) 40 MG tablet Take 1 tablet (40 mg total) by mouth daily. 45 tablet 0  . tamsulosin (FLOMAX) 0.4 MG CAPS capsule TAKE (1) CAPSULE BY MOUTH EVERY DAY (Patient taking differently: Take 0.4 mg by mouth at bedtime. ) 90 capsule 3  . traMADol (ULTRAM) 50 MG tablet Take 50-100 mg by mouth every 6 (six) hours as needed for pain.     No current facility-administered medications for this encounter.     Allergies  Allergen Reactions  . Ciprofloxacin Rash    Social History   Socioeconomic History  .  Marital status: Married    Spouse name: Not on file  . Number of children: Not on file  . Years of education: Not on file  . Highest education level: Not on file  Occupational History  . Not on file  Social Needs  . Financial resource strain: Not on file  . Food insecurity:    Worry: Not on file    Inability: Not on file  . Transportation needs:    Medical: Not on file    Non-medical: Not on file  Tobacco Use  . Smoking status: Former Smoker    Packs/day:  0.50    Types: Cigarettes    Last attempt to quit: 09/15/2008    Years since quitting: 9.9  . Smokeless tobacco: Never Used  Substance and Sexual Activity  . Alcohol use: Yes    Alcohol/week: 14.0 standard drinks    Types: 14 Shots of liquor per week  . Drug use: No  . Sexual activity: Not on file  Lifestyle  . Physical activity:    Days per week: Not on file    Minutes per session: Not on file  . Stress: Not on file  Relationships  . Social connections:    Talks on phone: Not on file    Gets together: Not on file    Attends religious service: Not on file    Active member of club or organization: Not on file    Attends meetings of clubs or organizations: Not on file    Relationship status: Not on file  . Intimate partner violence:    Fear of current or ex partner: Not on file    Emotionally abused: Not on file    Physically abused: Not on file    Forced sexual activity: Not on file  Other Topics Concern  . Not on file  Social History Narrative   Lives in [place] with spouse   Owns rental property    Family History  Problem Relation Age of Onset  . Ovarian cancer Mother   . Throat cancer Father   . Prostate cancer Neg Hx   . Kidney cancer Neg Hx     ROS- All systems are reviewed and negative except as per the HPI above  Physical Exam: Vitals:   08/26/18 0926  Weight: 99.3 kg  Height: 5\' 11"  (1.803 m)   Wt Readings from Last 3 Encounters:  08/26/18 99.3 kg  07/27/18 93 kg  06/07/18 98.2 kg    Labs: Lab Results  Component Value Date   NA 132 (L) 07/13/2018   K 4.9 07/13/2018   CL 96 07/13/2018   CO2 22 07/13/2018   GLUCOSE 87 07/13/2018   BUN 12 07/13/2018   CREATININE 1.15 07/13/2018   CALCIUM 9.0 07/13/2018   Lab Results  Component Value Date   INR 0.97 10/29/2017   No results found for: CHOL, HDL, LDLCALC, TRIG   GEN- The patient is well appearing, alert and oriented x 3 today.   Head- normocephalic, atraumatic Eyes-  Sclera clear,  conjunctiva pink Ears- hearing intact Oropharynx- clear Neck- supple, no JVP Lymph- no cervical lymphadenopathy Lungs- Clear to ausculation bilaterally, normal work of breathing Heart- Regular rate and rhythm, no murmurs, rubs or gallops, PMI not laterally displaced GI- soft, NT, ND, + BS Extremities- no clubbing, cyanosis, or edema MS- no significant deformity or atrophy Skin- no rash or lesion Psych- euthymic mood, full affect Neuro- strength and sensation are intact  EKG-Sinus rhythm with first degree AV block.  Pr int 243ms, qrs int 90 ms, qtc 449 ms Epic records reviewed    Assessment and Plan: 1.Persistent afig  Doing very well s/p ablation without any afib In SR today  Continue amiodarone 200 mg daily, anticipate this will be stopped at appointment with Dr. Rayann Heman in February  Continue  eliquis with a chadsvasc score of 1, also anticipate that pt will stop this as well with f/u with Dr. Rayann Heman, for now he is aware not to interrupt therapy during the nest 2 months He has arthritis and would like to be able to use NSAID's again  Continue BB without change  F/u with Dr. Rayann Heman 2/12  Geroge Baseman. Bettina Warn, [facility] [address] [place], [place] [address] [phone]

## 2018-08-30 ENCOUNTER — Ambulatory Visit
Admission: RE | Admit: 2018-08-30 | Discharge: 2018-08-30 | Disposition: A | Discharge status: Home / Self Care | Payer: PPO | Source: Ambulatory Visit | Attending: Family Medicine | Admitting: Family Medicine

## 2018-08-30 DIAGNOSIS — E041 Nontoxic single thyroid nodule: Secondary | ICD-10-CM | POA: Diagnosis not present

## 2018-08-30 DIAGNOSIS — R221 Localized swelling, mass and lump, neck: Secondary | ICD-10-CM | POA: Diagnosis not present

## 2018-09-06 ENCOUNTER — Other Ambulatory Visit (HOSPITAL_COMMUNITY): Payer: Self-pay | Admitting: Internal Medicine

## 2018-09-20 ENCOUNTER — Other Ambulatory Visit: Payer: PPO

## 2018-09-20 DIAGNOSIS — N401 Enlarged prostate with lower urinary tract symptoms: Principal | ICD-10-CM

## 2018-09-20 DIAGNOSIS — N138 Other obstructive and reflux uropathy: Secondary | ICD-10-CM | POA: Diagnosis not present

## 2018-09-21 LAB — PSA: Prostate Specific Ag, Serum: 0.2 ng/mL (ref 0.0–4.0)

## 2018-09-21 NOTE — Progress Notes (Signed)
09/22/2018 8:47 AM   Alexander Carey September 03, 1951 [id number]  Referring provider: Valera Castle, [address] [place], [place] [address]  Chief Complaint  Patient presents with  . Follow-up    HPI: Patient is a 68 year old Caucasian male with BPH with LU TS and erectile dysfunction who presents today for yearly follow-up.  BPH WITH LUTS  His IPSS score today is 13, which is moderate lower urinary tract symptomatology. He is pleased with his quality life due to his urinary symptoms.  His previous IPSS score was 8/1. He currently taking tamsulosin 0.4 mg and dutasteride 0.5 mg daily. He does not have a family history of PCa. He denies dysuria, hematuria, fever, chills, and any other symptoms.  IPSS    Row Name 09/22/18 0800         International Prostate Symptom Score   How often have you had the sensation of not emptying your bladder?  Less than 1 in 5     How often have you had to urinate less than every two hours?  Less than half the time     How often have you found you stopped and started again several times when you urinated?  Less than half the time     How often have you found it difficult to postpone urination?  Less than 1 in 5 times     How often have you had a weak urinary stream?  Less than half the time     How often have you had to strain to start urination?  Less than 1 in 5 times     How many times did you typically get up at night to urinate?  4 Times     Total IPSS Score  13       Quality of Life due to urinary symptoms   If you were to spend the rest of your life with your urinary condition just the way it is now how would you feel about that?  Pleased           Score:  1-7 Mild 8-19 Moderate 20-35 Severe   Erectile dysfunction  His SHIM score is 15, which is mild to moderate ED. His previous SHIM score was 12. He has been having difficulty with erections for several years. His major complaint is lack of firmness. His libido is preserved.  His risk factors for ED are age, BPH, HTN and HLD. He is finding the Cialis not as effective as in the past.  Viagra gives him a headache. He denies painful erections and notes that his erections do not curve.     SHIM    Row Name 09/22/18 [phone]         SHIM: Over the last 6 months:   How do you rate your confidence that you could get and keep an erection?  Moderate     When you had erections with sexual stimulation, how often were your erections hard enough for penetration (entering your partner)?  Sometimes (about half the time)     During sexual intercourse, how often were you able to maintain your erection after you had penetrated (entered) your partner?  Sometimes (about half the time)     During sexual intercourse, how difficult was it to maintain your erection to completion of intercourse?  Difficult     When you attempted sexual intercourse, how often was it satisfactory for you?  Sometimes (about half the time)  SHIM Total Score   SHIM  15          Score: 1-7 Severe ED 8-11 Moderate ED 12-16 Mild-Moderate ED 17-21 Mild ED 22-25 No ED     PMH: Past Medical History:  Diagnosis Date  . Back pain    lower  . ED (erectile dysfunction)   . Enlarged prostate   . Hypogonadism in male   . Paroxysmal A-fib [facility])     Surgical History: Past Surgical History:  Procedure Laterality Date  . ATRIAL FIBRILLATION ABLATION N/A 07/27/2018   Procedure: ATRIAL FIBRILLATION ABLATION;  Surgeon: Thompson Grayer, MD;  Location: [place] [facility];  Service: Cardiovascular;  Laterality: N/A;  . CARDIOVERSION N/A 01/13/2018   Procedure: CARDIOVERSION;  Surgeon: Nelva Bush, MD;  Location: [place] [facility];  Service: Cardiovascular;  Laterality: N/A;  . CARDIOVERSION N/A 01/26/2018   Procedure: CARDIOVERSION;  Surgeon: Minna Merritts, MD;  Location: [facility];  Service: Cardiovascular;  Laterality: N/A;  . CARDIOVERSION N/A 03/01/2018   Procedure: CARDIOVERSION;  Surgeon: Minna Merritts, MD;  Location: [facility];  Service: Cardiovascular;  Laterality: N/A;  . CARDIOVERSION N/A 05/21/2018   Procedure: CARDIOVERSION;  Surgeon: Wellington Hampshire, MD;  Location: [facility];  Service: Cardiovascular;  Laterality: N/A;  . COLONOSCOPY  04/2005  . TONSILLECTOMY    . 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 Always use your most recent med list.        acetaminophen 650 MG CR tablet Commonly known as:  TYLENOL Take 650 mg by mouth every 8 (eight) hours as needed for pain.   amiodarone 200 MG tablet Commonly known as:  PACERONE Take 1 tablet (200 mg total) by mouth daily.   COMBIVENT RESPIMAT 20-100 MCG/ACT Aers respimat Generic drug:  Ipratropium-Albuterol Inhale 1 puff into the lungs every 6 (six) hours as needed for wheezing or shortness of breath.   dutasteride 0.5 MG capsule Commonly known as:  AVODART Take 1 capsule (0.5 mg total) by mouth daily.   ELIQUIS 5 MG Tabs tablet Generic drug:  apixaban Take 5 mg by mouth 2 (two) times daily.   levothyroxine 25 MCG tablet Commonly known as:  SYNTHROID, LEVOTHROID Take 1 tablet (25 mcg total) by mouth daily before breakfast.   metoprolol tartrate 25 MG tablet Commonly known as:  LOPRESSOR Take 1.5 tablets (37.5 mg total) by mouth 2 (two) times daily.   pantoprazole 40 MG tablet Commonly known as:  PROTONIX TAKE (1) TABLET BY MOUTH EVERY DAY   tadalafil 20 MG tablet Commonly known as:  ADCIRCA/CIALIS Take 1 tablet (20 mg total) by mouth daily as needed for erectile dysfunction.   tamsulosin 0.4 MG Caps capsule Commonly known as:  FLOMAX TAKE (1) CAPSULE BY MOUTH EVERY DAY     traMADol 50 MG tablet Commonly known as:  ULTRAM Take 50-100 mg by mouth every 6 (six) hours as needed for pain.       Allergies:  Allergies  Allergen Reactions  . Ciprofloxacin Rash    Family History: Family History  Problem Relation Age of Onset  .  Ovarian cancer Mother   . Throat cancer Father   . Prostate cancer Neg Hx   . Kidney cancer Neg Hx     Social History:  reports that he quit smoking about 10 years ago. His smoking use included cigarettes. He smoked 0.50 packs per day. He has never used smokeless tobacco. He reports current alcohol use of about 14.0 standard drinks of alcohol per week. He reports that he does not use drugs.  ROS: UROLOGY Frequent Urination?: No Hard to postpone urination?: No Burning/pain with urination?: No Get up at night to urinate?: No Leakage of urine?: No Urine stream starts and stops?: No Trouble starting stream?: No Do you have to strain to urinate?: No Blood in urine?: No Urinary tract infection?: No Sexually transmitted disease?: No Injury to kidneys or bladder?: No Painful intercourse?: No Weak stream?: No Erection problems?: No Penile pain?: No  Gastrointestinal Nausea?: No Vomiting?: No Indigestion/heartburn?: No Diarrhea?: No Constipation?: No  Constitutional Fever: No Night sweats?: No Weight loss?: No Fatigue?: No  Skin Skin rash/lesions?: No Itching?: No  Eyes Blurred vision?: No Double vision?: No  Ears/Nose/Throat Sore throat?: No Sinus problems?: No  Hematologic/Lymphatic Swollen glands?: No Easy bruising?: No  Cardiovascular Leg swelling?: No Chest pain?: No  Respiratory Cough?: No Shortness of breath?: No  Endocrine Excessive thirst?: No  Musculoskeletal Back pain?: Yes Joint pain?: No  Neurological Headaches?: No Dizziness?: No  Psychologic Depression?: No Anxiety?: No  Physical Exam: BP (!) 166/94   Pulse 65   Ht 5\' 11"  (1.803 m)   Wt 223 lb (101.2 kg)   BMI  31.10 kg/m   Constitutional: Well nourished. Alert and oriented, No acute distress. HEENT: [place] AT, moist mucus membranes. Trachea midline, no masses. Cardiovascular: No clubbing, cyanosis, or edema. Respiratory: Normal respiratory effort, no increased work of breathing. GI: Abdomen is soft, non tender, non distended, no abdominal masses. Liver and spleen not palpable.  No hernias appreciated.  Stool sample for occult testing is not indicated.   GU: No CVA tenderness.  No bladder fullness or masses.  Patient with uncircumcised phallus. Foreskin easily retracted  Urethral meatus is patent. No penile discharge. No penile lesions or rashes. Scrotum without lesions, cysts, rashes and/or edema.  Testicles are located scrotally bilaterally. No masses are appreciated in the testicles. Left and right epididymis are normal. Rectal: Patient with  normal sphincter tone. Anus and perineum without scarring or rashes. No rectal masses are appreciated. Prostate is approximately 60 grams, no nodules are appreciated. Could only palpate the apex and midportion of the gland.  Skin: No rashes, bruises or suspicious lesions. Lymph: No cervical or inguinal adenopathy. Neurologic: Grossly intact, no focal deficits, moving all 4 extremities. Psychiatric: Normal mood and affect.   Laboratory Data: PSA History 12/2012   PSA 0.2 07/2014 PSA 0.3 09/2016 PSA 0.4 09/22/2017, PSA 0.3 09/21/2018, PSA 0.2  Assessment & Plan:    1. BPH with LUTS  - IPSS score is 13/1, it is stable  - Continue conservative management, avoiding bladder irritants and timed voiding's  - Continue tamsulosin 0.4 mg daily and dutasteride 0.5 mg daily; refills given  - RTC in 12 months for IPSS, PSA and exam   2. Erectile dysfunction  - SHIM score is 15, it is slightly worsening   -Will prescribe the patient 20 mg Cialis   - RTC in 12 months for repeat SHIM score and exam    Return in about 1 year (around 09/23/2019) for IPSS, PSA, SHIM, and  exam.  These notes generated with voice recognition software. I apologize for typographical errors.  Zara Council, PA-C  [facility] [address] [place]  [place], [place] [address] [phone]   I, [facility], am acting as a Education administrator for [employer].   I have reviewed the above documentation for accuracy and completeness, and I agree with the above.    Zara Council, PA-C

## 2018-09-22 ENCOUNTER — Ambulatory Visit: Payer: PPO | Admitting: Urology

## 2018-09-22 ENCOUNTER — Encounter: Payer: Self-pay | Admitting: Urology

## 2018-09-22 VITALS — BP 166/94 | HR 65 | Ht 71.0 in | Wt 223.0 lb

## 2018-09-22 DIAGNOSIS — N529 Male erectile dysfunction, unspecified: Secondary | ICD-10-CM

## 2018-09-22 DIAGNOSIS — N138 Other obstructive and reflux uropathy: Secondary | ICD-10-CM

## 2018-09-22 DIAGNOSIS — N401 Enlarged prostate with lower urinary tract symptoms: Secondary | ICD-10-CM

## 2018-09-22 MED ORDER — TADALAFIL 20 MG PO TABS: 20.0000 mg | ORAL_TABLET | Freq: Every day | ORAL | 11 refills | Status: DC | PRN

## 2018-09-22 MED ORDER — TAMSULOSIN HCL 0.4 MG PO CAPS: ORAL_CAPSULE | ORAL | 3 refills | Status: DC

## 2018-09-22 MED ORDER — DUTASTERIDE 0.5 MG PO CAPS: 0.5000 mg | ORAL_CAPSULE | Freq: Every day | ORAL | 3 refills | Status: DC

## 2018-10-07 ENCOUNTER — Encounter: Payer: Self-pay | Admitting: Internal Medicine

## 2018-10-08 ENCOUNTER — Other Ambulatory Visit: Payer: Self-pay | Admitting: Neurosurgery

## 2018-10-08 DIAGNOSIS — J069 Acute upper respiratory infection, unspecified: Secondary | ICD-10-CM | POA: Diagnosis not present

## 2018-10-11 ENCOUNTER — Telehealth: Payer: Self-pay | Admitting: *Deleted

## 2018-10-11 DIAGNOSIS — M2241 Chondromalacia patellae, right knee: Secondary | ICD-10-CM | POA: Diagnosis not present

## 2018-10-11 NOTE — Telephone Encounter (Signed)
   Primary Cardiologist: Thompson Grayer, MD  Chart reviewed as part of pre-operative protocol coverage.   Per pharmacy, patient can hold eliquis 3 days prior to lumbar spine surgery. Per Kerby Less note 08/2018, patient may be able to stop his eliquis 10/2018 given that he will be 3 months post-ablation.   Dr. Rayann Heman, can you please address this patients preoperative status at his visit with you 10/27/2018?    I will remove from the pre-op pool at this time.   Abigail Butts, PA-C 10/11/2018, 4:26 PM

## 2018-10-11 NOTE — Telephone Encounter (Signed)
Patient with diagnosis of Afib s/p ablation on 07/27/18 on Eliquis for anticoagulation.    Procedure: Lumbar fusion Date of procedure: 11/15/18 - will be greater than 3 months post ablation   CHADS2-VASc score of  1 (CHF, HTN, AGE, DM2, stroke/tia x 2, CAD, AGE, male)  CrCl 169ml/min  Per office protocol, patient can hold Eliquis for 3 days prior to procedure. Of note Kerby Less most recent note suggests that Eliquis may be stopped at appt in Feb.

## 2018-10-11 NOTE — Telephone Encounter (Signed)
   [facility] Pre-operative Risk Assessment    Request for surgical clearance:  1. What type of surgery is being performed? L4-5 L5-S1 POSTERIOR LUMBAR INTERBODY FUSION   2. When is this surgery scheduled? 11/15/18   3. What type of clearance is required (medical clearance vs. Pharmacy clearance to hold med vs. Both)? BOTH  4. Are there any medications that need to be held prior to surgery and how long?ELIQUIS   5. Practice name and name of physician performing surgery? [facility]; DR. JEFFREY JENKINS   6. What is your office phone number [phone]    7.   What is your office fax number [phone]  8.   Anesthesia type (None, local, MAC, general) ? GENERAL   Julaine Hua 10/11/2018, 1:37 PM  _________________________________________________________________   (provider comments below)

## 2018-10-27 ENCOUNTER — Encounter (INDEPENDENT_AMBULATORY_CARE_PROVIDER_SITE_OTHER): Payer: Self-pay

## 2018-10-27 ENCOUNTER — Ambulatory Visit: Payer: PPO | Admitting: Internal Medicine

## 2018-10-27 ENCOUNTER — Encounter: Payer: Self-pay | Admitting: Internal Medicine

## 2018-10-27 VITALS — BP 128/84 | HR 57 | Ht 72.0 in | Wt 216.8 lb

## 2018-10-27 DIAGNOSIS — G4730 Sleep apnea, unspecified: Secondary | ICD-10-CM | POA: Diagnosis not present

## 2018-10-27 DIAGNOSIS — I4819 Other persistent atrial fibrillation: Secondary | ICD-10-CM

## 2018-10-27 MED ORDER — METOPROLOL TARTRATE 25 MG PO TABS: ORAL_TABLET | ORAL | 3 refills | Status: DC

## 2018-10-27 NOTE — Patient Instructions (Addendum)
Medication Instructions:  Your physician has recommended you make the following change in your medication:   1.  Stop taking amiodarone  2.  Wean off your metoprolol 37. 5 mg twice a day  A.  Decrease to 25 mg by mouth twice a day for 2 weeks  B.  Then decrease to 12.5 mg (1/2 tablet) by mouth for 2 weeks.  C.  Then stop  Labwork: None ordered.  Testing/Procedures: None ordered.  Follow-Up: Your physician wants you to follow-up in: 3 months with Dr. Rayann Heman.     Any Other Special Instructions Will Be Listed Below (If Applicable).  If you need a refill on your cardiac medications before your next appointment, please call your pharmacy.

## 2018-10-27 NOTE — Progress Notes (Signed)
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  Since his recent afib ablation, the patient reports doing very well.  he denies procedure related complications and is pleased with the results of the procedure.  Today, he denies symptoms of palpitations, chest pain, shortness of breath,  lower extremity edema, dizziness, presyncope, or syncope.  The patient is otherwise without complaint today.   Past Medical History:  Diagnosis Date  . Back pain    lower  . ED (erectile dysfunction)   . Enlarged prostate   . Hypogonadism in male   . Paroxysmal A-fib [facility])    Past Surgical History:  Procedure Laterality Date  . ATRIAL FIBRILLATION ABLATION N/A 07/27/2018   Procedure: ATRIAL FIBRILLATION ABLATION;  Surgeon: Thompson Grayer, MD;  Location: [place] [facility];  Service: Cardiovascular;  Laterality: N/A;  . CARDIOVERSION N/A 01/13/2018   Procedure: CARDIOVERSION;  Surgeon: Nelva Bush, MD;  Location: [place] [facility];  Service: Cardiovascular;  Laterality: N/A;  . CARDIOVERSION N/A 01/26/2018   Procedure: CARDIOVERSION;  Surgeon: Minna Merritts, MD;  Location: [facility];  Service: Cardiovascular;  Laterality: N/A;  . CARDIOVERSION N/A 03/01/2018   Procedure: CARDIOVERSION;  Surgeon: Minna Merritts, MD;  Location: [facility];  Service: Cardiovascular;  Laterality: N/A;  . CARDIOVERSION N/A 05/21/2018   Procedure: CARDIOVERSION;  Surgeon: Wellington Hampshire, MD;  Location: [facility];  Service: Cardiovascular;  Laterality: N/A;  . COLONOSCOPY  04/2005  . TONSILLECTOMY    . 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 acetaminophen (TYLENOL) 650 MG CR tablet Take 650 mg by mouth every 8 (eight) hours as needed for pain.    Marland Kitchen amiodarone (PACERONE) 200 MG tablet Take 1 tablet (200 mg total) by mouth daily. 90 tablet 3  . dutasteride (AVODART) 0.5 MG capsule Take 1 capsule (0.5 mg total) by mouth daily. 90 capsule 3  . ELIQUIS 5 MG TABS tablet Take 5 mg by mouth 2 (two) times daily.     Marland Kitchen levothyroxine (SYNTHROID, LEVOTHROID) 25 MCG tablet Take 1 tablet (25 mcg total) by mouth daily before breakfast. 30 tablet 1  . metoprolol tartrate (LOPRESSOR) 25 MG tablet Take 1.5 tablets (37.5 mg total) by mouth 2 (two) times daily. 270 tablet 3  . tamsulosin (FLOMAX) 0.4 MG CAPS capsule TAKE (1) CAPSULE BY MOUTH EVERY DAY 90 capsule 3  . traMADol (ULTRAM) 50 MG tablet Take 50-100 mg by mouth every 6 (six) hours as needed for pain.    . Ipratropium-Albuterol (COMBIVENT RESPIMAT) 20-100 MCG/ACT AERS respimat Inhale 1 puff into the lungs every 6 (six) hours as needed for wheezing or shortness of breath.      No current facility-administered medications for this visit.     Physical Exam: Vitals:   10/27/18 0940  BP: 128/84  Pulse: (!) 57  SpO2: 98%  Weight: 216 lb 12.8 oz (98.3 kg)  Height: 6' (1.829 m)    GEN- The patient is well appearing, alert and oriented x 3 today.   Head- normocephalic, atraumatic Eyes-  Sclera clear, conjunctiva pink  Ears- hearing intact Oropharynx- clear Lungs- Clear to ausculation bilaterally, normal work of breathing Heart- Regular rate and rhythm, no murmurs, rubs or gallops, PMI not laterally displaced GI- soft, NT, ND, + BS Extremities- no clubbing, cyanosis, or edema  EKG tracing ordered today is personally reviewed and shows sinus rhythm 57 bpm, PR 202 msec, Qtc 432 msec  Assessment and Plan:  1. Persistent atrial fibrillation Doing well s/p ablation chads2vasc score is 1 He is on eliquis .  We  discussed pros and cons at length today.  He wishes to continue for now.  Stop amiodarone Wean off of metoprolol over 4 weeks  2. OSA Referred to Dr Ron Parker  3. Post termination pauses Resolved post ablation with sinus   Return to see me in 3 months  Thompson Grayer MD, [facility] 10/27/2018 9:51 AM

## 2018-11-08 ENCOUNTER — Inpatient Hospital Stay (HOSPITAL_COMMUNITY): Admission: RE | Admit: 2018-11-08 | Payer: PPO | Source: Ambulatory Visit

## 2018-11-15 ENCOUNTER — Inpatient Hospital Stay: Admit: 2018-11-15 | Payer: PPO | Admitting: Neurosurgery

## 2018-11-15 SURGERY — POSTERIOR LUMBAR FUSION 2 LEVEL
Anesthesia: General

## 2019-01-24 ENCOUNTER — Telehealth: Payer: Self-pay | Admitting: Internal Medicine

## 2019-01-24 NOTE — Telephone Encounter (Signed)
New Message  Patient has smart phone and computer to do virtual visit please call to help setup for appt on 5/18.

## 2019-01-27 ENCOUNTER — Telehealth: Payer: Self-pay

## 2019-01-27 NOTE — Telephone Encounter (Signed)
° °  Please return call to spouse 

## 2019-01-27 NOTE — Telephone Encounter (Signed)
Left message regarding appt on 01/31/19. 

## 2019-01-27 NOTE — Telephone Encounter (Signed)
Spoke with pts wife regarding appt on 01/31/19. Pt was advise to check vitals piror to appt. Pt concerns were address.

## 2019-01-31 ENCOUNTER — Telehealth (INDEPENDENT_AMBULATORY_CARE_PROVIDER_SITE_OTHER): Payer: PPO | Admitting: Internal Medicine

## 2019-01-31 ENCOUNTER — Encounter: Payer: Self-pay | Admitting: Internal Medicine

## 2019-01-31 VITALS — BP 146/81 | HR 53 | Ht 72.0 in | Wt 216.0 lb

## 2019-01-31 DIAGNOSIS — I4819 Other persistent atrial fibrillation: Secondary | ICD-10-CM | POA: Diagnosis not present

## 2019-01-31 DIAGNOSIS — G4733 Obstructive sleep apnea (adult) (pediatric): Secondary | ICD-10-CM

## 2019-01-31 NOTE — Progress Notes (Signed)
Electrophysiology TeleHealth Note   Due to national recommendations of social distancing due to COVID 19, an audio/video telehealth visit is felt to be most appropriate for this patient at this time.  See MyChart message from today for the patient's consent to telehealth for [facility].   Date:  01/31/2019   ID:  Alexander Carey, DOB Jul 11, 1951, MRN [id number]  Location: patient's home  Provider location: [address] [place]  Evaluation Performed: Follow-up visit  PCP:  Valera Castle, MD  Cardiologist:  Dr Saralyn Pilar Electrophysiologist:  Dr Caryl Comes  Chief Complaint:  afib  History of Present Illness:    Alexander Carey is a 68 y.o. male who presents via audio/video conferencing for a telehealth visit today.  Since last being seen in our clinic, the patient reports doing very well.  Today, he denies symptoms of palpitations, chest pain, shortness of breath,  lower extremity edema, dizziness, presyncope, or syncope.  The patient is otherwise without complaint today.  The patient denies symptoms of fevers, chills, cough, or new SOB worrisome for COVID 19.  Past Medical History:  Diagnosis Date  . Back pain    lower  . ED (erectile dysfunction)   . Enlarged prostate   . Hypogonadism in male   . Paroxysmal A-fib [facility])     Past Surgical History:  Procedure Laterality Date  . ATRIAL FIBRILLATION ABLATION N/A 07/27/2018   Procedure: ATRIAL FIBRILLATION ABLATION;  Surgeon: Thompson Grayer, MD;  Location: [place] [facility];  Service: Cardiovascular;  Laterality: N/A;  . CARDIOVERSION N/A 01/13/2018   Procedure: CARDIOVERSION;  Surgeon: Nelva Bush, MD;  Location: [place] [facility];  Service: Cardiovascular;  Laterality: N/A;  . CARDIOVERSION N/A 01/26/2018   Procedure: CARDIOVERSION;  Surgeon: Minna Merritts, MD;  Location: [facility];  Service: Cardiovascular;  Laterality: N/A;  . CARDIOVERSION N/A 03/01/2018   Procedure: CARDIOVERSION;  Surgeon: Minna Merritts, MD;  Location: [facility];  Service: Cardiovascular;  Laterality: N/A;  . CARDIOVERSION N/A 05/21/2018   Procedure: CARDIOVERSION;  Surgeon: Wellington Hampshire, MD;  Location: [facility];  Service: Cardiovascular;  Laterality: N/A;  . COLONOSCOPY  04/2005  . TONSILLECTOMY    . 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 acetaminophen (TYLENOL) 650 MG CR tablet Take 650 mg by mouth every 8 (eight) hours as needed for pain.    Marland Kitchen dutasteride (AVODART) 0.5 MG capsule Take 1 capsule (0.5 mg total) by mouth daily. 90 capsule 3  . ELIQUIS 5 MG TABS tablet Take 5 mg by mouth 2 (two) times daily.     . Ipratropium-Albuterol (COMBIVENT RESPIMAT) 20-100 MCG/ACT AERS respimat Inhale 1 puff into the lungs every 6 (six) hours as needed for wheezing or shortness of breath.     . levothyroxine (SYNTHROID, LEVOTHROID) 25 MCG tablet Take 1 tablet (25 mcg total) by mouth daily before breakfast. 30 tablet 1  . tamsulosin (FLOMAX) 0.4 MG CAPS capsule TAKE (1) CAPSULE BY MOUTH EVERY DAY (Patient taking differently: Take 0.4 mg by mouth daily. ) 90 capsule 3   No current facility-administered medications for this visit.     Allergies:   Ciprofloxacin   Social History:  The patient  reports that he quit smoking about 10 years  ago. His smoking use included cigarettes. He smoked 0.50 packs per day. He has never used smokeless tobacco. He reports current alcohol use of about 14.0 standard drinks of alcohol per week. He reports that he does not use drugs.   Family History:  The patient's  family history includes Ovarian cancer in his mother; Throat cancer in his father.   ROS:  Please see the history of present illness.   All other systems are personally reviewed and  negative.    Exam:    Vital Signs:  BP (!) 146/81   Pulse (!) 53   Ht 6' (1.829 m)   Wt 216 lb (98 kg)   BMI 29.29 kg/m   Well appearing, alert and conversant, regular work of breathing,  good skin color Eyes- anicteric, neuro- grossly intact, skin- no apparent rash or lesions or cyanosis, mouth- oral mucosa is pink   Labs/Other Tests and Data Reviewed:    Recent Labs: 05/18/2018: ALT 21; TSH 8.512 07/13/2018: BUN 12; Creatinine, Ser 1.15; Hemoglobin 14.2; Platelets 188; Potassium 4.9; Sodium 132   Wt Readings from Last 3 Encounters:  01/31/19 216 lb (98 kg)  10/27/18 216 lb 12.8 oz (98.3 kg)  09/22/18 223 lb (101.2 kg)     Other studies personally reviewed: Additional studies/ records that were reviewed today include: my prior notes  Review of the above records today demonstrates: as above Prior radiographs: 07/21/2018- cardiac CT reveals calcium score 64%   ASSESSMENT & PLAN:    1.  afib  Well controlled post ablation off AAD therapy chads2vasc score is 1.  He wishes to continue anticoagulation for now  2. OSA (mild) He has a dental prosthesis from Dr Ron Parker which he wears "occasionally"  3. Post termination pauses Resolved post ablation  4. COVID 19 screen The patient denies symptoms of COVID 19 at this time.  The importance of social distancing was discussed today.  Follow-up:  6 months with me  Current medicines are reviewed at length with the patient today.   The patient does not have concerns regarding his medicines.  The following changes were made today:  none  Labs/ tests ordered today include:  No orders of the defined types were placed in this encounter.  Patient Risk:  after full review of this patients clinical status, I feel that they are at moderate risk at this time.  Today, I have spent 15 minutes with the patient with telehealth technology discussing afib .    Army Fossa, MD  01/31/2019 11:31 AM     [facility] [address] [place] [address] [phone] (office) [phone] (fax)

## 2019-02-15 DIAGNOSIS — M722 Plantar fascial fibromatosis: Secondary | ICD-10-CM | POA: Diagnosis not present

## 2019-02-24 DIAGNOSIS — E039 Hypothyroidism, unspecified: Secondary | ICD-10-CM | POA: Diagnosis not present

## 2019-02-24 DIAGNOSIS — N401 Enlarged prostate with lower urinary tract symptoms: Secondary | ICD-10-CM | POA: Diagnosis not present

## 2019-02-24 DIAGNOSIS — Z136 Encounter for screening for cardiovascular disorders: Secondary | ICD-10-CM | POA: Diagnosis not present

## 2019-02-24 DIAGNOSIS — E785 Hyperlipidemia, unspecified: Secondary | ICD-10-CM | POA: Diagnosis not present

## 2019-02-24 DIAGNOSIS — R351 Nocturia: Secondary | ICD-10-CM | POA: Diagnosis not present

## 2019-03-03 ENCOUNTER — Other Ambulatory Visit: Payer: Self-pay | Admitting: Family Medicine

## 2019-03-03 DIAGNOSIS — E785 Hyperlipidemia, unspecified: Secondary | ICD-10-CM

## 2019-03-11 ENCOUNTER — Other Ambulatory Visit: Payer: Self-pay | Admitting: Family Medicine

## 2019-03-11 DIAGNOSIS — E785 Hyperlipidemia, unspecified: Secondary | ICD-10-CM

## 2019-03-11 DIAGNOSIS — Z87891 Personal history of nicotine dependence: Secondary | ICD-10-CM

## 2019-03-14 ENCOUNTER — Ambulatory Visit
Admission: RE | Admit: 2019-03-14 | Discharge: 2019-03-14 | Disposition: A | Discharge status: Home / Self Care | Payer: PPO | Source: Ambulatory Visit | Attending: Family Medicine | Admitting: Family Medicine

## 2019-03-14 ENCOUNTER — Other Ambulatory Visit: Payer: Self-pay

## 2019-03-14 ENCOUNTER — Ambulatory Visit: Payer: PPO

## 2019-03-14 DIAGNOSIS — Z136 Encounter for screening for cardiovascular disorders: Secondary | ICD-10-CM | POA: Diagnosis not present

## 2019-03-14 DIAGNOSIS — E785 Hyperlipidemia, unspecified: Secondary | ICD-10-CM | POA: Diagnosis not present

## 2019-03-14 DIAGNOSIS — Z87891 Personal history of nicotine dependence: Secondary | ICD-10-CM | POA: Insufficient documentation

## 2019-03-21 ENCOUNTER — Other Ambulatory Visit: Payer: Self-pay | Admitting: Cardiovascular Disease

## 2019-03-26 ENCOUNTER — Other Ambulatory Visit: Payer: Self-pay | Admitting: Cardiovascular Disease

## 2019-03-28 NOTE — Telephone Encounter (Signed)
Please review for refill. Thanks!  

## 2019-04-11 ENCOUNTER — Other Ambulatory Visit: Payer: Self-pay | Admitting: Cardiovascular Disease

## 2019-04-11 NOTE — Telephone Encounter (Signed)
Please review for refill. Thanks!  

## 2019-04-19 DIAGNOSIS — M722 Plantar fascial fibromatosis: Secondary | ICD-10-CM | POA: Diagnosis not present

## 2019-05-13 DIAGNOSIS — M2241 Chondromalacia patellae, right knee: Secondary | ICD-10-CM | POA: Diagnosis not present

## 2019-07-04 ENCOUNTER — Other Ambulatory Visit: Payer: Self-pay | Admitting: Urology

## 2019-07-08 ENCOUNTER — Telehealth: Payer: Self-pay | Admitting: Internal Medicine

## 2019-07-08 NOTE — Telephone Encounter (Signed)
New Message     Pt c/o BP issue: STAT if pt c/o blurred vision, one-sided weakness or slurred speech  1. What are your last 5 BP readings? Today it was 164/98 @ 6:30am and now it's 160/90  2. Are you having any other symptoms (ex. Dizziness, headache, blurred vision, passed out)? No  3. What is your BP issue? Patient's BP was elevated and would like to speak to a nurse.

## 2019-07-08 NOTE — Telephone Encounter (Signed)
Call returned to Pt's wife.  Pt weaned of metoprolol tartrate in February.  Per wife they have been monitoring blood pressure, and it consistently runs in the 160's over 90's.  Would like Dr. Rayann Heman to prescribe something for hypertension.

## 2019-07-10 NOTE — Telephone Encounter (Signed)
Start losartan 50mg  daily.  Follow-up with cardiology APP in 2 weeks for BP check and bmet.

## 2019-07-11 MED ORDER — LOSARTAN POTASSIUM 50 MG PO TABS: 50.0000 mg | ORAL_TABLET | Freq: Every day | ORAL | 3 refills | Status: DC

## 2019-07-11 NOTE — Telephone Encounter (Signed)
Call placed to wife.  Advised to start losartan 50 mg daily  Follow up made in [place] per Pt/wife  For follow up BP/htn and to check bmp  Wife indicates understanding.

## 2019-07-22 ENCOUNTER — Ambulatory Visit (INDEPENDENT_AMBULATORY_CARE_PROVIDER_SITE_OTHER): Payer: PPO | Admitting: Nurse Practitioner

## 2019-07-22 ENCOUNTER — Other Ambulatory Visit: Payer: Self-pay

## 2019-07-22 ENCOUNTER — Encounter: Payer: Self-pay | Admitting: Nurse Practitioner

## 2019-07-22 VITALS — BP 130/80 | HR 74 | Temp 97.2°F | Ht 72.0 in | Wt 217.8 lb

## 2019-07-22 DIAGNOSIS — I10 Essential (primary) hypertension: Secondary | ICD-10-CM | POA: Diagnosis not present

## 2019-07-22 DIAGNOSIS — I4819 Other persistent atrial fibrillation: Secondary | ICD-10-CM

## 2019-07-22 NOTE — Progress Notes (Signed)
Office Visit    Patient Name: Alexander Carey Date of Encounter: 07/22/2019  Primary Care Provider:  Valera Castle, MD Primary Cardiologist:  Virl Axe, MD  Chief Complaint    68 year old male with a history of persistent atrial fibrillation with posttermination pauses, BPH, chronic low back pain, and erectile dysfunction, who presents for follow-up secondary to hypertension.  Past Medical History    Past Medical History:  Diagnosis Date  . Back pain    lower  . ED (erectile dysfunction)   . Enlarged prostate   . Hypogonadism in male   . Paroxysmal A-fib ([place])    a. 07/2018 s/p PVI.   Past Surgical History:  Procedure Laterality Date  . ATRIAL FIBRILLATION ABLATION N/A 07/27/2018   Procedure: ATRIAL FIBRILLATION ABLATION;  Surgeon: Thompson Grayer, MD;  Location: [place];  Service: Cardiovascular;  Laterality: N/A;  . CARDIOVERSION N/A 01/13/2018   Procedure: CARDIOVERSION;  Surgeon: Nelva Bush, MD;  Location: [place] [facility];  Service: Cardiovascular;  Laterality: N/A;  . CARDIOVERSION N/A 01/26/2018   Procedure: CARDIOVERSION;  Surgeon: Minna Merritts, MD;  Location: [facility];  Service: Cardiovascular;  Laterality: N/A;  . CARDIOVERSION N/A 03/01/2018   Procedure: CARDIOVERSION;  Surgeon: Minna Merritts, MD;  Location: [facility];  Service: Cardiovascular;  Laterality: N/A;  . CARDIOVERSION N/A 05/21/2018   Procedure: CARDIOVERSION;  Surgeon: Wellington Hampshire, MD;  Location: [facility];  Service: Cardiovascular;  Laterality: N/A;  . COLONOSCOPY  04/2005  . TONSILLECTOMY    . 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;  Service: Orthopedics;  Laterality: Left;    Allergies  Allergies  Allergen Reactions  . Statins Other (See Comments)    Patient declines any statins  . Ciprofloxacin Rash    History of  Present Illness    68 year old male with the above past medical history including persistent atrial fibrillation with posttermination pauses, BPH, chronic low back pain, and erectile dysfunction.  He underwent catheter ablation in pulmonary vein isolation for A. fib back in November 2019 and has done well since then.  He had previously failed Rythmol, flecainide, and amiodarone therapy.  His wife follows his blood pressure at home and he was recently noted to have pressures trending in the 160s.  They contacted Dr. Rayann Heman, and losartan 50 mg daily was prescribed.  He was advised to follow-up here for blood pressure check and labs.  Since initiating losartan, pressures have been trending in the 130s to 140s.  He has been asymptomatic and denies any chest pain, dyspnea, palpitations, PND, orthopnea, dizziness, syncope, edema, or early satiety.  To the best of his knowledge, he is tolerating losartan.  His only complaint today is that he has to wear a mask and he is tired of COVID-19.  He thinks it might go away "now that the election is over."  Home Medications    Prior to Admission medications   Medication Sig Start Date End Date Taking? Authorizing Provider  acetaminophen (TYLENOL) 650 MG CR tablet Take 650 mg by mouth every 8 (eight) hours as needed for pain.   Yes [provider]  dutasteride (AVODART) 0.5 MG capsule TAKE 1 CAPSULE BY MOUTH DAILY. 07/04/19  Yes McGowan, Shannon A, PA-C  ELIQUIS 5 MG TABS tablet Take 5 mg by mouth 2 (two) times daily.  08/03/17  Yes [provider]  Ipratropium-Albuterol (COMBIVENT RESPIMAT) 20-100 MCG/ACT AERS respimat Inhale 1 puff into the lungs every 6 (six) hours as needed for wheezing or shortness of breath.  10/13/17  Yes [provider]  levothyroxine (SYNTHROID, LEVOTHROID) 25 MCG tablet Take 1 tablet (25 mcg total) by mouth daily before breakfast. 06/15/18  Yes Deboraha Sprang, MD  losartan (COZAAR) 50 MG tablet Take 1 tablet (50 mg  total) by mouth daily. 07/11/19 10/09/19 Yes Allred, Jeneen Rinks, MD  tamsulosin (FLOMAX) 0.4 MG CAPS capsule TAKE (1) CAPSULE BY MOUTH EVERY DAY Patient taking differently: Take 0.4 mg by mouth daily.  09/22/18  Yes McGowan, Hunt Oris, PA-C    Review of Systems    He denies chest pain, palpitations, dyspnea, pnd, orthopnea, n, v, dizziness, syncope, edema, weight gain, or early satiet.  All other systems reviewed and are otherwise negative except as noted above.  Physical Exam    VS:  BP 130/80 (BP Location: Left Arm, Patient Position: Sitting, Cuff Size: Normal)   Pulse 74   Temp (!) 97.2 F (36.2 C)   Ht 6' (1.829 m)   Wt 217 lb 12 oz (98.8 kg)   SpO2 98%   BMI 29.53 kg/m  , BMI Body mass index is 29.53 kg/m. GEN: Well nourished, well developed, in no acute distress. HEENT: normal. Neck: Supple, no JVD, carotid bruits, or masses. Cardiac: RRR, no murmurs, rubs, or gallops. No clubbing, cyanosis, edema.  Radials/PT 2+ and equal bilaterally.  Respiratory:  Respirations regular and unlabored, clear to auscultation bilaterally. GI: Soft, nontender, nondistended, BS + x 4. MS: no deformity or atrophy. Skin: warm and dry, no rash. Neuro:  Strength and sensation are intact. Psych: Normal affect.  Accessory Clinical Findings    ECG personally reviewed by me today -regular sinus rhythm, 74- no acute changes.  Lab Results  Component Value Date   WBC 5.9 07/13/2018   HGB 14.2 07/13/2018   HCT 43.4 07/13/2018   MCV 106 (H) 07/13/2018   PLT 188 07/13/2018   Lab Results  Component Value Date   CREATININE 1.15 07/13/2018   BUN 12 07/13/2018   NA 132 (L) 07/13/2018   K 4.9 07/13/2018   CL 96 07/13/2018   CO2 22 07/13/2018   Lab Results  Component Value Date   ALT 21 05/18/2018   AST 20 05/18/2018   ALKPHOS 68 05/18/2018   BILITOT 0.8 05/18/2018     Assessment & Plan    1.  Essential hypertension: Patient recently noted elevated blood pressure trends at home, frequently in the  160s to 170s.  He is now on losartan 50 mg daily and pressures have been trending in the 130s to 140s.  I will follow-up a basic metabolic panel today.  Given ongoing elevated trends, provided that renal function and electrolytes are within normal limits, I will plan to increase losartan to 100 mg daily.  2.  Persistent atrial fibrillation: Doing well status post catheter ablation in late 2019.  He remains anticoagulated on Eliquis.  3.  Chronic low back pain: Previously evaluated with plan for surgery prior to Covid but since surgery has been canceled, he has been having second thoughts about pursuing this.  He does use ibuprofen 600 mg about once or twice a week.  I did advise to try and limit nonsteroidal anti-inflammatory dosing in the setting of chronic Eliquis.  4.  Disposition: Follow-up lab work today.  Follow-up with Dr. Caryl Comes in 3 months or sooner if necessary (He  does not wish to f/u @ [facility] any longer).   Murray Hodgkins, NP 07/22/2019, 5:52 PM

## 2019-07-22 NOTE — Patient Instructions (Signed)
Medication Instructions:  Your physician recommends that you continue on your current medications as directed. Please refer to the Current Medication list given to you today.  *If you need a refill on your cardiac medications before your next appointment, please call your pharmacy*  Lab Work: Your physician recommends that you return for lab work in: [place].  If you have labs (blood work) drawn today and your tests are completely normal, you will receive your results only by: Marland Kitchen MyChart Message (if you have MyChart) OR . A paper copy in the mail If you have any lab test that is abnormal or we need to change your treatment, we will call you to review the results.  Testing/Procedures: none   Follow-Up: At [facility], you and your health needs are our priority.  As part of our continuing mission to provide you with exceptional heart care, we have created designated Provider Care Teams.  These Care Teams include your primary Cardiologist (physician) and Advanced Practice Providers (APPs -  Physician Assistants and Nurse Practitioners) who all work together to provide you with the care you need, when you need it.  Your next appointment:   3 months  The format for your next appointment:   In Person  Provider:   Dr Virl Axe

## 2019-07-23 LAB — BASIC METABOLIC PANEL
BUN/Creatinine Ratio: 14 (ref 10–24)
BUN: 10 mg/dL (ref 8–27)
CO2: 21 mmol/L (ref 20–29)
Calcium: 9 mg/dL (ref 8.6–10.2)
Chloride: 104 mmol/L (ref 96–106)
Creatinine, Ser: 0.69 mg/dL — ABNORMAL LOW (ref 0.76–1.27)
GFR calc Af Amer: 112 mL/min/{1.73_m2} (ref >59)
GFR calc non Af Amer: 97 mL/min/{1.73_m2} (ref >59)
Glucose: 67 mg/dL (ref 65–99)
Potassium: 4.4 mmol/L (ref 3.5–5.2)
Sodium: 137 mmol/L (ref 134–144)

## 2019-07-25 ENCOUNTER — Telehealth: Payer: Self-pay | Admitting: Internal Medicine

## 2019-07-25 ENCOUNTER — Telehealth: Payer: Self-pay | Admitting: Nurse Practitioner

## 2019-07-25 MED ORDER — METOPROLOL TARTRATE 25 MG PO TABS: 25.0000 mg | ORAL_TABLET | Freq: Two times a day (BID) | ORAL | 1 refills | Status: DC

## 2019-07-25 NOTE — Telephone Encounter (Signed)
° °  Pt c/o of Chest Pain: STAT if CP now or developed within 24 hours  1. Are you having CP right now? 12:30 am today     2. Are you experiencing any other symptoms (ex. SOB, nausea, vomiting, sweating)? Lightheaded, heart is beating hard  3. How long have you been experiencing CP? Since 12:30 am   4. Is your CP continuous or coming and going? It was continuous, but it has eased off  5. Have you taken Nitroglycerin? No. Patient does not even have any  Wife of the patient called. The patient was experiencing some chest pain, and the wife feels the patient may be back in afib. The patient saw a doctor on Friday and had a good EKG, but the patient's pulse is definitely iradic now. The patient also had labs drawn on Friday to determine how the BP medicine was affecting his kidneys.   Patient c/o Palpitations:  High priority if patient c/o lightheadedness, shortness of breath, or chest pain  1) How long have you had palpitations/irregular HR/ Afib? Are you having the symptoms now? yes  2) Are you currently experiencing lightheadedness, SOB or CP? Strong, pounding heartbeat  3) Do you have a history of afib (atrial fibrillation) or irregular heart rhythm? yes  4) Have you checked your BP or HR? (document readings if available):   12:30 am 128/82 HR 99  6:30 am 125/94 HR 73   5) Are you experiencing any other symptoms?

## 2019-07-25 NOTE — Telephone Encounter (Signed)
Notes recorded by Theora Gianotti, NP on 07/25/2019 at 10:09 AM EST  Renal fxn and lytes stable. Pls increase losartan to 100mg  daily w/ f/u bmet in 1 wk to reassess K after med change.

## 2019-07-25 NOTE — Telephone Encounter (Signed)
I spoke with the patient's wife regarding his lab results and recommendations from Ignacia Bayley, NP. Per Mrs. Ellender, they called the [facility] office this morning for Dr. Rayann Heman as they felt like the patient went back out of rhythm last night. A message was forwarded to the afib clinic and Roderic Palau, NP recommended he resume metoprolol tartrate 25 mg BID to see if this would convert him back to rhythm and if not, they will need to call the a-fib clinic back tomorrow to reassess.  I advised Mrs. Crogan I would notify Ignacia Bayley, NP of this prior to having him increase his losartan dose.  She is aware we will call back with further recommendations.   She voices understanding and is agreeable.

## 2019-07-25 NOTE — Telephone Encounter (Signed)
The patient's wife reports the patient has been out of rhythm since about 12:30AM. He has been experiencing some lightheadedness, and "really heavy and hard heart beats." He has no CP, but his heart sometimes pounds so hard that the beats are painful. He has no other symptoms. 12:30AM BP: 128/82 and HR 99 6:30AM BP: 125/94 and HR 73 Confirmed with patient's wife he has been taking his medications (including Eliquis) as directed. She anxiously awaits instructions.  To Afib Clinic for assistance.

## 2019-07-25 NOTE — Telephone Encounter (Signed)
Thank you Heather.  If he is resuming Metoprolol, then he should not increase losartan (and also no need for f/u bmet).  Thanks for the update!

## 2019-07-25 NOTE — Telephone Encounter (Signed)
Discussed below with Roderic Palau NP - will try restarting metoprolol tartrate 25mg  twice a day and see if this will convert patient - if no conversion will call back tomorrow to afib clinic for assessment. Pt in agreement. RX sent.

## 2019-07-26 NOTE — Telephone Encounter (Signed)
Call to relay POC from Ignacia Bayley, NP, spoke to wife Vickie.   Reviewed plan. She verbalized understanding and had no further questions at this time. Follow up as planned, will call before if any changes come up.   Wife reports that patient HR seems to be back in rhythm and BP is 113/78.

## 2019-07-26 NOTE — Telephone Encounter (Signed)
Patient's wife called in this morning stating patient converted to NSR overnight. HR in the 80s BP 130/72. Feeling well. Pt will continue metoprolol until follow up that is already scheduled with Dr. Rayann Heman on 11/23. Pt can reduce metoprolol to 1/2 tab BID if have issues with fatigue on 25mg  dose. Pt and wife verbalize understanding.

## 2019-08-01 ENCOUNTER — Encounter: Payer: Self-pay | Admitting: Internal Medicine

## 2019-08-01 ENCOUNTER — Telehealth (INDEPENDENT_AMBULATORY_CARE_PROVIDER_SITE_OTHER): Payer: PPO | Admitting: Internal Medicine

## 2019-08-01 VITALS — BP 139/87 | HR 71 | Ht 72.0 in | Wt 215.0 lb

## 2019-08-01 DIAGNOSIS — I4819 Other persistent atrial fibrillation: Secondary | ICD-10-CM | POA: Diagnosis not present

## 2019-08-01 DIAGNOSIS — I10 Essential (primary) hypertension: Secondary | ICD-10-CM

## 2019-08-01 DIAGNOSIS — G4733 Obstructive sleep apnea (adult) (pediatric): Secondary | ICD-10-CM

## 2019-08-01 NOTE — Progress Notes (Signed)
Electrophysiology TeleHealth Note   Due to national recommendations of social distancing due to COVID 19, an audio/video telehealth visit is felt to be most appropriate for this patient at this time.  See MyChart message from today for the patient's consent to telehealth for Alexander Carey.  Date:  08/01/2019   ID:  [name], DOB 06-16-1951, MRN [id number]  Location: patient's home  Provider location:  [facility]  Evaluation Performed: Follow-up visit  PCP:  Valera Castle, MD   Electrophysiologist:  Dr Rayann Heman  Chief Complaint:  palpitations  History of Present Illness:    Alexander Carey is a 68 y.o. male who presents via telehealth conferencing today.  Since last being seen in our clinic, the patient reports doing very well.  He did have an episode of irregular heart and palpitations 07/25/2019.  He took metoprolol and his symptoms resolved.   Today, he denies symptoms of palpitations, exertional chest pain, shortness of breath,  lower extremity edema, dizziness, presyncope, or syncope.  The patient is otherwise without complaint today.  The patient denies symptoms of fevers, chills, cough, or new SOB worrisome for COVID 19.  Past Medical History:  Diagnosis Date  . Back pain    lower  . ED (erectile dysfunction)   . Enlarged prostate   . Essential hypertension   . Hypogonadism in male   . Paroxysmal A-fib ([place])    a. 07/2018 s/p PVI.    Past Surgical History:  Procedure Laterality Date  . ATRIAL FIBRILLATION ABLATION N/A 07/27/2018   Procedure: ATRIAL FIBRILLATION ABLATION;  Surgeon: Thompson Grayer, MD;  Location: [facility];  Service: Cardiovascular;  Laterality: N/A;  . CARDIOVERSION N/A 01/13/2018   Procedure: CARDIOVERSION;  Surgeon: Nelva Bush, MD;  Location: [facility];  Service: Cardiovascular;  Laterality: N/A;  . CARDIOVERSION N/A 01/26/2018   Procedure: CARDIOVERSION;  Surgeon: Minna Merritts, MD;  Location: [facility];  Service:  Cardiovascular;  Laterality: N/A;  . CARDIOVERSION N/A 03/01/2018   Procedure: CARDIOVERSION;  Surgeon: Minna Merritts, MD;  Location: [facility];  Service: Cardiovascular;  Laterality: N/A;  . CARDIOVERSION N/A 05/21/2018   Procedure: CARDIOVERSION;  Surgeon: Wellington Hampshire, MD;  Location: [facility];  Service: Cardiovascular;  Laterality: N/A;  . COLONOSCOPY  04/2005  . TONSILLECTOMY    . 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 acetaminophen (TYLENOL) 650 MG CR tablet Take 650 mg by mouth every 8 (eight) hours as needed for pain.    Marland Kitchen dutasteride (AVODART) 0.5 MG capsule TAKE 1 CAPSULE BY MOUTH DAILY. 90 capsule 1  . ELIQUIS 5 MG TABS tablet Take 5 mg by mouth 2 (two) times daily.     . Ipratropium-Albuterol (COMBIVENT RESPIMAT) 20-100 MCG/ACT AERS respimat Inhale 1 puff into the lungs every 6 (six) hours as needed for wheezing or shortness of breath.     . levothyroxine (SYNTHROID, LEVOTHROID) 25 MCG tablet Take 1 tablet (25 mcg total) by mouth daily before breakfast. 30 tablet 1  . losartan (COZAAR) 50 MG tablet Take 1 tablet (50 mg total) by mouth daily. 90 tablet 3  . metoprolol tartrate (LOPRESSOR) 25 MG tablet Take 1 tablet (25 mg total) by mouth 2 (two) times daily. 60 tablet 1  .  tamsulosin (FLOMAX) 0.4 MG CAPS capsule TAKE (1) CAPSULE BY MOUTH EVERY DAY (Patient taking differently: Take 0.4 mg by mouth daily. ) 90 capsule 3   No current facility-administered medications for this visit.     Allergies:   Statins and Ciprofloxacin   Social History:  The patient  reports that he quit smoking about 10 years ago. His smoking use included cigarettes. He smoked 0.50 packs per day. He has never used smokeless tobacco. He reports current  alcohol use of about 14.0 standard drinks of alcohol per week. He reports that he does not use drugs.   Family History:  The patient's family history includes Ovarian cancer in his mother; Throat cancer in his father.   ROS:  Please see the history of present illness.   All other systems are personally reviewed and negative.    Exam:    Vital Signs:  BP 139/87   Pulse 71   Ht 6' (1.829 m)   Wt 215 lb (97.5 kg)   BMI 29.16 kg/m   Well sounding and appearing, alert and conversant, regular work of breathing,  good skin color Eyes- anicteric, neuro- grossly intact, skin- no apparent rash or lesions or cyanosis, mouth- oral mucosa is pink  Labs/Other Tests and Data Reviewed:    Recent Labs: 07/22/2019: BUN 10; Creatinine, Ser 0.69; Potassium 4.4; Sodium 137   Wt Readings from Last 3 Encounters:  08/01/19 215 lb (97.5 kg)  07/22/19 217 lb 12 oz (98.8 kg)  01/31/19 216 lb (98 kg)     ASSESSMENT & PLAN:    1.  Persistent atrial fibrillation Doing very well s/p ablation off AAD therapy On eliquis,  chads2vasc score is 1  2. Post termination pauses Resolved post ablation  3. OSA (mild) He has a dental prosthesis from Dr Cresenciano Genre he occasionally uses  4. HTN Improved with losartan  Follow-up:  3 months in AF clinic   Patient Risk:  after full review of this patients clinical status, I feel that they are at moderate risk at this time.  Today, I have spent 15 minutes with the patient with telehealth technology discussing arrhythmia management .    Army Fossa, MD  08/01/2019 2:30 PM     [place] [address] [phone] (office) [phone] (fax)

## 2019-08-08 ENCOUNTER — Ambulatory Visit: Payer: PPO | Admitting: Internal Medicine

## 2019-08-18 DIAGNOSIS — M5442 Lumbago with sciatica, left side: Secondary | ICD-10-CM | POA: Diagnosis not present

## 2019-08-18 DIAGNOSIS — G8929 Other chronic pain: Secondary | ICD-10-CM | POA: Diagnosis not present

## 2019-08-18 DIAGNOSIS — M5441 Lumbago with sciatica, right side: Secondary | ICD-10-CM | POA: Diagnosis not present

## 2019-08-25 ENCOUNTER — Other Ambulatory Visit: Payer: Self-pay | Admitting: Neurosurgery

## 2019-08-25 DIAGNOSIS — M5442 Lumbago with sciatica, left side: Secondary | ICD-10-CM

## 2019-08-25 DIAGNOSIS — G8929 Other chronic pain: Secondary | ICD-10-CM

## 2019-09-06 ENCOUNTER — Ambulatory Visit: Payer: PPO

## 2019-09-06 ENCOUNTER — Other Ambulatory Visit: Payer: Self-pay

## 2019-09-06 ENCOUNTER — Ambulatory Visit
Admission: RE | Admit: 2019-09-06 | Discharge: 2019-09-06 | Disposition: A | Discharge status: Home / Self Care | Payer: PPO | Source: Ambulatory Visit | Attending: Neurosurgery | Admitting: Neurosurgery

## 2019-09-06 DIAGNOSIS — G8929 Other chronic pain: Secondary | ICD-10-CM | POA: Insufficient documentation

## 2019-09-06 DIAGNOSIS — M5442 Lumbago with sciatica, left side: Secondary | ICD-10-CM | POA: Diagnosis not present

## 2019-09-06 DIAGNOSIS — M545 Low back pain: Secondary | ICD-10-CM | POA: Diagnosis not present

## 2019-09-06 DIAGNOSIS — M5441 Lumbago with sciatica, right side: Secondary | ICD-10-CM | POA: Diagnosis not present

## 2019-09-20 ENCOUNTER — Other Ambulatory Visit: Payer: Self-pay | Admitting: Nurse Practitioner

## 2019-09-21 ENCOUNTER — Other Ambulatory Visit: Payer: PPO

## 2019-09-21 ENCOUNTER — Other Ambulatory Visit: Payer: Self-pay | Admitting: Family Medicine

## 2019-09-21 ENCOUNTER — Other Ambulatory Visit: Payer: Self-pay

## 2019-09-21 DIAGNOSIS — N138 Other obstructive and reflux uropathy: Secondary | ICD-10-CM | POA: Diagnosis not present

## 2019-09-21 DIAGNOSIS — M4316 Spondylolisthesis, lumbar region: Secondary | ICD-10-CM | POA: Diagnosis not present

## 2019-09-21 DIAGNOSIS — N401 Enlarged prostate with lower urinary tract symptoms: Secondary | ICD-10-CM

## 2019-09-21 DIAGNOSIS — M4180 Other forms of scoliosis, site unspecified: Secondary | ICD-10-CM | POA: Diagnosis not present

## 2019-09-22 LAB — PSA: Prostate Specific Ag, Serum: 0.2 ng/mL (ref 0.0–4.0)

## 2019-09-22 NOTE — Progress Notes (Signed)
09/22/2018 8:41 AM   Alexander Carey 11/22/1950 [id number]  Referring provider: Valera Castle, [address] [place],  [place] [address]  Chief Complaint  Patient presents with  . Benign Prostatic Hypertrophy    HPI: Patient is a 69 year old male with BPH with LU TS and erectile dysfunction who presents today for yearly follow-up.  BPH WITH LUTS  His IPSS score today is 9, which is moderate lower urinary tract symptomatology.   He is pleased with his quality life due to his urinary symptoms.  His previous IPSS score was 13/1.  He main complaints today are nocturia x 1 and intermittency.  He currently taking tamsulosin 0.4 mg and dutasteride 0.5 mg daily. He does not have a family history of PCa. He denies dysuria, hematuria, fever, chills, and any other symptoms.   IPSS    Row Name 09/23/19 0800         International Prostate Symptom Score   How often have you had the sensation of not emptying your bladder?  Less than 1 in 5     How often have you had to urinate less than every two hours?  Less than half the time     How often have you found you stopped and started again several times when you urinated?  Less than half the time     How often have you found it difficult to postpone urination?  Not at All     How often have you had a weak urinary stream?  Less than half the time     How often have you had to strain to start urination?  Less than 1 in 5 times     How many times did you typically get up at night to urinate?  1 Time     Total IPSS Score  9       Quality of Life due to urinary symptoms   If you were to spend the rest of your life with your urinary condition just the way it is now how would you feel about that?  Pleased           Score:  1-7 Mild 8-19 Moderate 20-35 Severe   Erectile dysfunction  His SHIM score is 19, which is mild ED. His previous SHIM score was 15. He has been having difficulty with erections for several years. His major  complaint is lack of firmness. His libido is preserved. His risk factors for ED are age, BPH, HTN and HLD. He is finding the Cialis not as effective as in the past.  Viagra gives him a headache. He denies painful erections and notes that his erections do not curve.     SHIM    Row Name 09/23/19 0831         SHIM: Over the last 6 months:   How do you rate your confidence that you could get and keep an erection?  Moderate     When you had erections with sexual stimulation, how often were your erections hard enough for penetration (entering your partner)?  Sometimes (about half the time)     During sexual intercourse, how often were you able to maintain your erection after you had penetrated (entered) your partner?  Most Times (much more than half the time)     During sexual intercourse, how difficult was it to maintain your erection to completion of intercourse?  Slightly Difficult     When you attempted sexual intercourse, how often  was it satisfactory for you?  Almost Always or Always       SHIM Total Score   SHIM  19          Score: 1-7 Severe ED 8-11 Moderate ED 12-16 Mild-Moderate ED 17-21 Mild ED 22-25 No ED     PMH: Past Medical History:  Diagnosis Date  . Back pain    lower  . ED (erectile dysfunction)   . Enlarged prostate   . Essential hypertension   . Hypogonadism in male   . Paroxysmal A-fib ([place])    a. 07/2018 s/p PVI.    Surgical History: Past Surgical History:  Procedure Laterality Date  . ATRIAL FIBRILLATION ABLATION N/A 07/27/2018   Procedure: ATRIAL FIBRILLATION ABLATION;  Surgeon: Thompson Grayer, MD;  Location: [facility];  Service: Cardiovascular;  Laterality: N/A;  . CARDIOVERSION N/A 01/13/2018   Procedure: CARDIOVERSION;  Surgeon: Nelva Bush, MD;  Location: [facility];  Service: Cardiovascular;  Laterality: N/A;  . CARDIOVERSION N/A 01/26/2018   Procedure: CARDIOVERSION;  Surgeon: Minna Merritts, MD;  Location: [facility];  Service:  Cardiovascular;  Laterality: N/A;  . CARDIOVERSION N/A 03/01/2018   Procedure: CARDIOVERSION;  Surgeon: Minna Merritts, MD;  Location: [facility];  Service: Cardiovascular;  Laterality: N/A;  . CARDIOVERSION N/A 05/21/2018   Procedure: CARDIOVERSION;  Surgeon: Wellington Hampshire, MD;  Location: [facility];  Service: Cardiovascular;  Laterality: N/A;  . COLONOSCOPY  04/2005  . TONSILLECTOMY    . 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 If you have any questions, ask your nurse or doctor.        acetaminophen 650 MG CR tablet Commonly known as: TYLENOL Take 650 mg by mouth every 8 (eight) hours as needed for pain.   Combivent Respimat 20-100 MCG/ACT Aers respimat Generic drug: Ipratropium-Albuterol Inhale 1 puff into the lungs every 6 (six) hours as needed for wheezing or shortness of breath.   dutasteride 0.5 MG capsule Commonly known as: AVODART TAKE 1 CAPSULE BY MOUTH DAILY.   Eliquis 5 MG Tabs tablet Generic drug: apixaban Take 5 mg by mouth 2 (two) times daily.   levothyroxine 25 MCG tablet Commonly known as: SYNTHROID Take 1 tablet (25 mcg total) by mouth daily before breakfast.   losartan 50 MG tablet Commonly known as: COZAAR Take 1 tablet (50 mg total) by mouth daily.   metoprolol tartrate 25 MG tablet Commonly known as: LOPRESSOR TAKE (1) TABLET BY MOUTH TWICE DAILY   tamsulosin 0.4 MG Caps capsule Commonly known as: FLOMAX Take 1 capsule (0.4 mg total) by mouth daily.       Allergies:  Allergies  Allergen Reactions  . Statins Other (See Comments)    Patient  declines any statins  . Ciprofloxacin Rash    Family History: Family History  Problem Relation Age of Onset  . Ovarian cancer Mother   . Throat cancer Father   . Prostate cancer Neg Hx   . Kidney cancer Neg Hx     Social  History:  reports that he quit smoking about 11 years ago. His smoking use included cigarettes. He smoked 0.50 packs per day. He has never used smokeless tobacco. He reports current alcohol use of about 14.0 standard drinks of alcohol per week. He reports that he does not use drugs.  ROS: UROLOGY Frequent Urination?: No Hard to postpone urination?: No Burning/pain with urination?: No Get up at night to urinate?: Yes Leakage of urine?: No Urine stream starts and stops?: Yes Trouble starting stream?: No Do you have to strain to urinate?: No Blood in urine?: No Urinary tract infection?: No Sexually transmitted disease?: No Injury to kidneys or bladder?: No Painful intercourse?: No Weak stream?: No Erection problems?: No Penile pain?: No  Gastrointestinal Nausea?: No Vomiting?: No Indigestion/heartburn?: No Diarrhea?: No Constipation?: No  Constitutional Fever: No Night sweats?: No Weight loss?: No Fatigue?: No  Skin Skin rash/lesions?: No Itching?: No  Eyes Blurred vision?: No Double vision?: No  Ears/Nose/Throat Sore throat?: No Sinus problems?: No  Hematologic/Lymphatic Swollen glands?: No Easy bruising?: No  Cardiovascular Leg swelling?: No Chest pain?: No  Respiratory Cough?: No Shortness of breath?: No  Endocrine Excessive thirst?: No  Musculoskeletal Back pain?: Yes Joint pain?: No  Neurological Headaches?: No Dizziness?: No  Psychologic Depression?: No Anxiety?: No  Physical Exam: BP (!) 165/83   Pulse 96   Ht 6' (1.829 m)   Wt 219 lb 4.8 oz (99.5 kg)   BMI 29.74 kg/m   Constitutional:  Well nourished. Alert and oriented, No acute distress. HEENT: [place] AT, mask in place.  Trachea midline, no  masses. Cardiovascular: No clubbing, cyanosis, or edema. Respiratory: Normal respiratory effort, no increased work of breathing. GI: Abdomen is soft, non tender, non distended, no abdominal masses. Liver and spleen not palpable.  No hernias appreciated.  Stool sample for occult testing is not indicated.   GU: No CVA tenderness.  No bladder fullness or masses.  Patient with uncircumcised phallus.  Foreskin easily retracted  Urethral meatus is patent.  No penile discharge. No penile lesions or rashes. Scrotum without lesions, cysts, rashes and/or edema.  Testicles are located scrotally bilaterally. No masses are appreciated in the testicles. Left and right epididymis are normal. Rectal: Patient with  normal sphincter tone. Anus and perineum without scarring or rashes. No rectal masses are appreciated. Prostate is approximately 50 grams, could only palpate the apex and the midportion of the gland, no nodules are appreciated. Seminal vesicles could not be palpated.  Skin: No rashes, bruises or suspicious lesions. Lymph: No inguinal adenopathy. Neurologic: Grossly intact, no focal deficits, moving all 4 extremities. Psychiatric: Normal mood and affect.   Laboratory Data: PSA History 12/2012   PSA 0.2 07/2014 PSA 0.3  Component     Latest Ref Rng & Units 08/02/2015 09/22/2016 09/22/2017 09/20/2018  Prostate Specific Ag, Serum     0.0 - 4.0 ng/mL 0.3 0.4 0.3 0.2   Component     Latest Ref Rng & Units 09/21/2019  Prostate Specific Ag, Serum     0.0 - 4.0 ng/mL 0.2   Assessment & Plan:    1. BPH with LUTS IPSS score is 9/1, it is improved Continue conservative management, avoiding bladder irritants and timed voiding's Continue tamsulosin 0.4 mg daily and dutasteride 0.5 mg daily; refills given RTC in 12 months for IPSS, PSA and exam  2. Erectile dysfunction SHIM score is 19, it is slightly improved  Not taking Cialis very often - not in need of a prescription at this  time  RTC in 12 months for  repeat SHIM score and exam    Return in about 1 year (around 09/22/2020) for IPSS, SHIM, PSA and exam.  These notes generated with voice recognition software. I apologize for typographical errors.  Zara Council, PA-C  [facility] [address] [place]  [place], [place] [address] [phone]

## 2019-09-23 ENCOUNTER — Encounter: Payer: Self-pay | Admitting: Urology

## 2019-09-23 ENCOUNTER — Other Ambulatory Visit: Payer: Self-pay | Admitting: Family Medicine

## 2019-09-23 ENCOUNTER — Ambulatory Visit: Payer: PPO | Admitting: Urology

## 2019-09-23 ENCOUNTER — Other Ambulatory Visit: Payer: Self-pay

## 2019-09-23 VITALS — BP 165/83 | HR 96 | Ht 72.0 in | Wt 219.3 lb

## 2019-09-23 DIAGNOSIS — N138 Other obstructive and reflux uropathy: Secondary | ICD-10-CM | POA: Diagnosis not present

## 2019-09-23 DIAGNOSIS — N401 Enlarged prostate with lower urinary tract symptoms: Secondary | ICD-10-CM | POA: Diagnosis not present

## 2019-09-23 DIAGNOSIS — N529 Male erectile dysfunction, unspecified: Secondary | ICD-10-CM

## 2019-09-23 MED ORDER — TAMSULOSIN HCL 0.4 MG PO CAPS: 0.4000 mg | ORAL_CAPSULE | Freq: Every day | ORAL | 3 refills | Status: DC

## 2019-10-11 IMAGING — XA DG MYELOGRAPHY LUMBAR INJ LUMBOSACRAL
14 series · 14 of 14 positions shown · non-contrast
Comparison: None

CLINICAL DATA: Left anterior leg pain.  Left back pain.
TECHNIQUE: Contiguous axial images were obtained through the Lumbar spine after
the intrathecal infusion of infusion. Coronal and sagittal
reconstructions were obtained of the axial image sets.

[Series 1: vasc standard · 1 of 1 slices shown (1 of 14)]
[im 1/1]
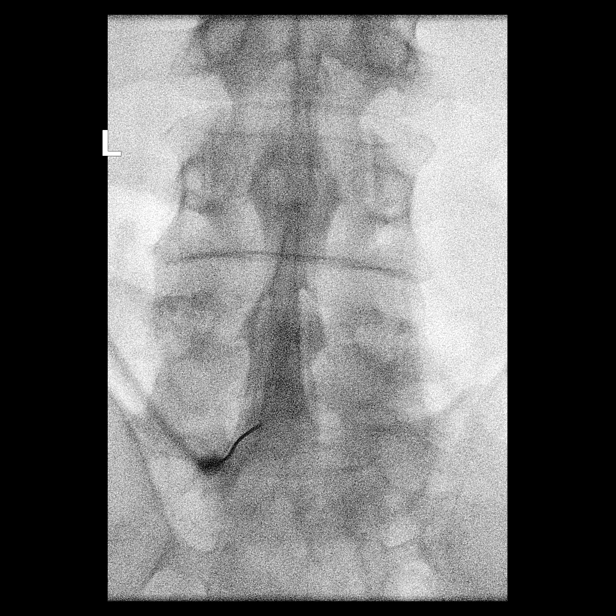

[Series 2: vasc standard · 1 of 1 slices shown (2 of 14)]
[im 1/1]
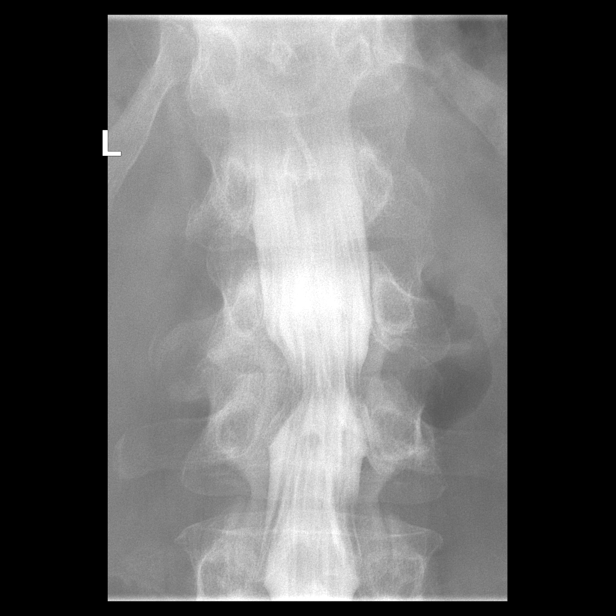

[Series 3: vasc standard · 1 of 1 slices shown (3 of 14)]
[im 1/1]
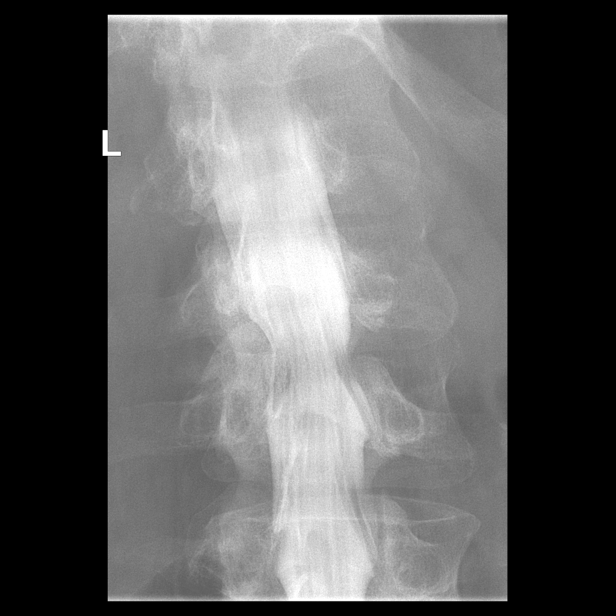

[Series 4: vasc standard · 1 of 1 slices shown (4 of 14)]
[im 1/1]
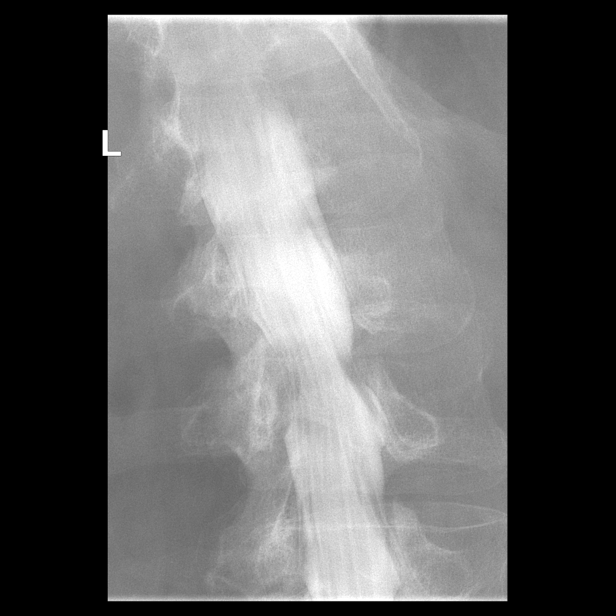

[Series 5: vasc standard · 1 of 1 slices shown (5 of 14)]
[im 1/1]
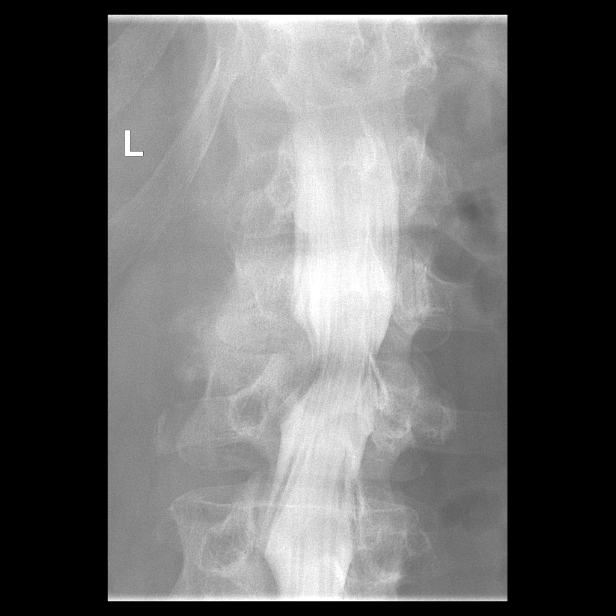

[Series 6: vasc standard · 1 of 1 slices shown (6 of 14)]
[im 1/1]
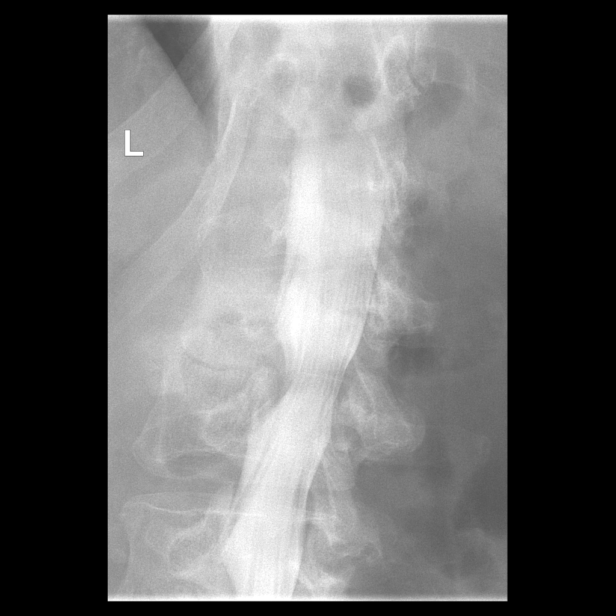

[Series 7: vasc standard · 1 of 1 slices shown (7 of 14)]
[im 1/1]
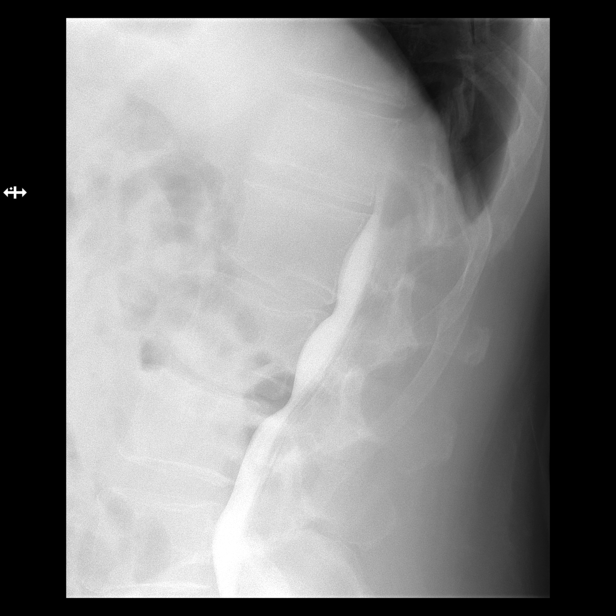

[Series 8: vasc standard · 1 of 1 slices shown (8 of 14)]
[im 1/1]
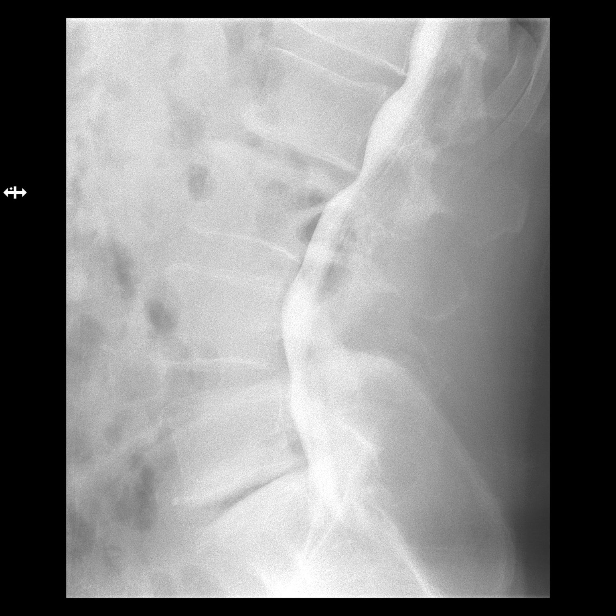

[Series 9: vasc standard · 1 of 1 slices shown (9 of 14)]
[im 1/1]
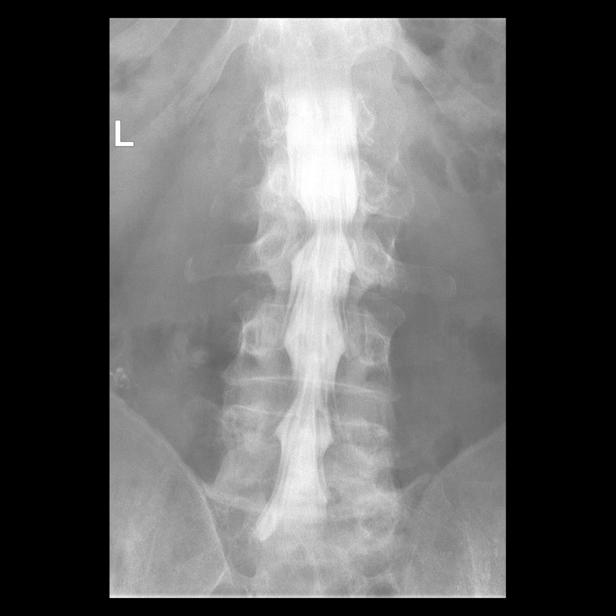

[Series 10: vasc standard · 1 of 1 slices shown (10 of 14)]
[im 1/1]
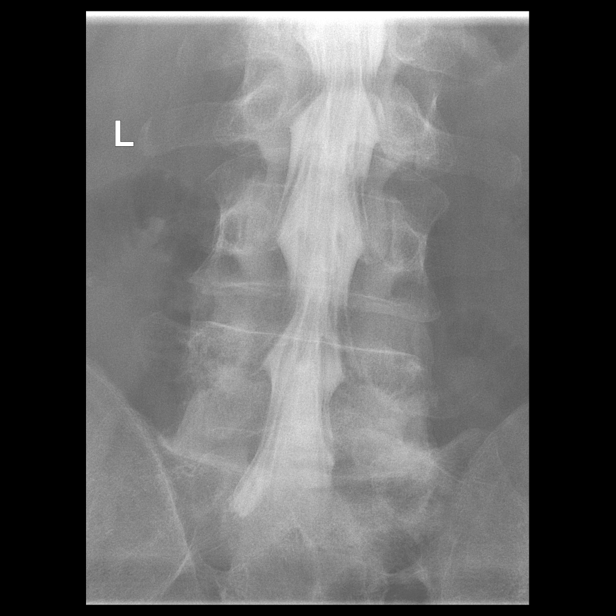

[Series 11: vasc standard · 1 of 1 slices shown (11 of 14)]
[im 1/1]
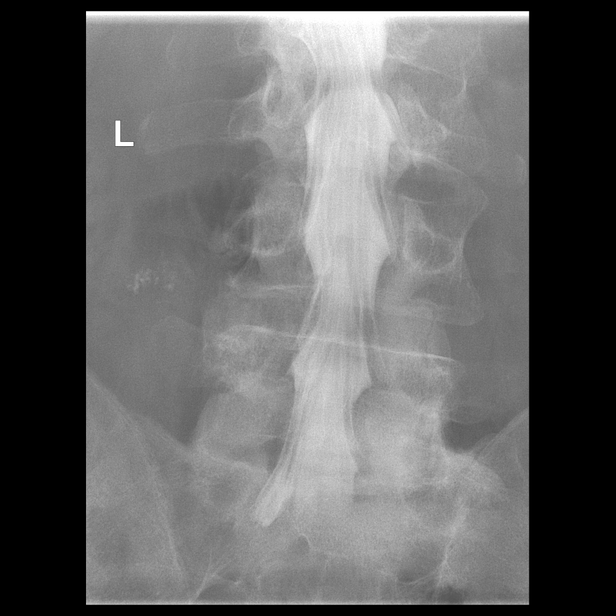

[Series 12: vasc standard · 1 of 1 slices shown (12 of 14)]
[im 1/1]
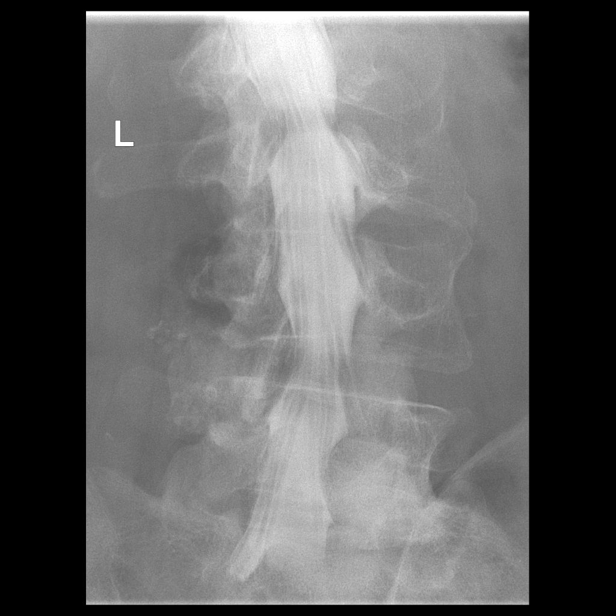

[Series 13: vasc standard · 1 of 1 slices shown (13 of 14)]
[im 1/1]
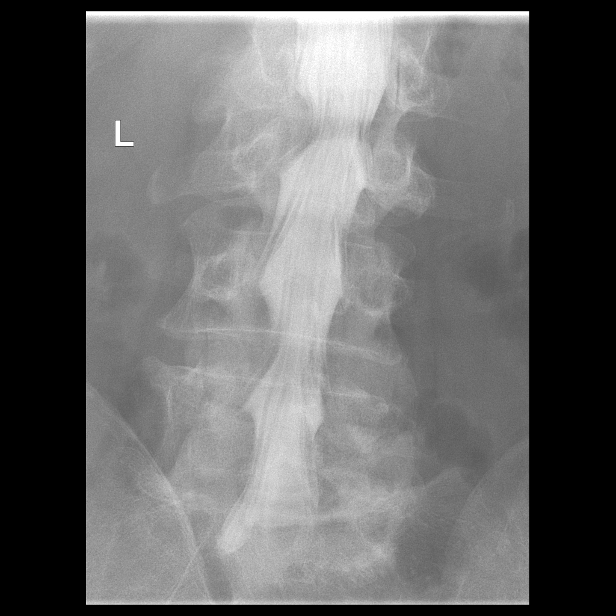

[Series 14: vasc standard · 1 of 1 slices shown (14 of 14)]
[im 1/1]
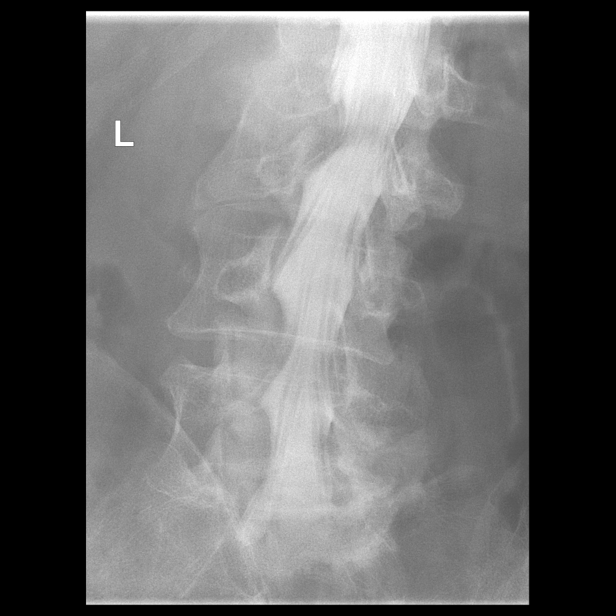

[14 of 14 positions shown; findings below may reference images not displayed]

EXAM:
LUMBAR MYELOGRAM

FLUOROSCOPY TIME:  40 seconds

PROCEDURE:
After thorough discussion of risks and benefits of the procedure
including bleeding, infection, injury to nerves, blood vessels,
adjacent structures as well as headache and CSF leak, written and
oral informed consent was obtained. Consent was obtained by Dr.
Arktas Schassing. Time out form was completed.

Patient was positioned prone on the fluoroscopy table. Local
anesthesia was provided with 1% lidocaine without epinephrine after
prepped and draped in the usual sterile fashion. Puncture was
performed at left L5-S1 using a 3 1/2 inch 22-gauge spinal needle
via paramedian approach. Using a single pass through the dura, the
needle was placed within the thecal sac, with return of clear CSF.
15 mL of Isovue M-PFF was injected into the thecal sac, with normal
opacification of the nerve roots and cauda equina consistent with
free flow within the subarachnoid space.

I personally performed the lumbar puncture and administered the
intrathecal contrast. I also personally supervised acquisition of
the myelogram images.
FINDINGS: There is no vertebral compression deformity. There is minimal
retrolisthesis L2 upon L3 in the neutral upright standing position.

There is mild anterior epidural mass effect at L1-2, moderate at
L2-3, and mild at L4-5. On the AP view, there is a moderate degree
of circumferential stenosis at L2-3 and L4-5. Bilateral lateral
recess narrowing is suspected at L2-3 and L4-5.

Flexion and extension views demonstrate little if any range of
motion in the lumbar spine. There is no prominent abnormal motion on
flexion or extension views to suggest gross instability.
IMPRESSION: Multilevel degenerative disc disease in lumbar spine as described.

## 2019-10-14 ENCOUNTER — Telehealth: Payer: Self-pay

## 2019-10-14 MED ORDER — ELIQUIS 5 MG PO TABS: 5.0000 mg | ORAL_TABLET | Freq: Two times a day (BID) | ORAL | 11 refills | Status: DC

## 2019-10-14 NOTE — Telephone Encounter (Signed)
Returned call to wife.  Advised to continue Eliquis until follow up with afib clinic in February.  Pt's wife asked to cancel appt coming up with Dr. Caryl Comes.  Cancelled per request.  Refill sent to pharmacy.

## 2019-10-14 NOTE — Telephone Encounter (Signed)
Pt's Wife called asking if he should continue taking Eliquis. He is supposed to f/u with Dr Rayann Heman in May.Marland KitchenMarland KitchenDr Emeline General (not sure if that's spelled correct) is his Cardiologist in [place] and he wants Dr Rayann Heman to decide if he should continue Eliquis or not.  If he needs to continue taking it, he needs a refill sent in. The pharmacy in his chart is correct. She would like you to call her and let her know for sure.  [phone]

## 2019-10-20 ENCOUNTER — Ambulatory Visit: Payer: PPO | Admitting: Internal Medicine

## 2019-11-01 DIAGNOSIS — L578 Other skin changes due to chronic exposure to nonionizing radiation: Secondary | ICD-10-CM | POA: Diagnosis not present

## 2019-11-01 DIAGNOSIS — C44212 Basal cell carcinoma of skin of right ear and external auricular canal: Secondary | ICD-10-CM | POA: Diagnosis not present

## 2019-11-01 DIAGNOSIS — C44311 Basal cell carcinoma of skin of nose: Secondary | ICD-10-CM | POA: Diagnosis not present

## 2019-11-01 DIAGNOSIS — C4491 Basal cell carcinoma of skin, unspecified: Secondary | ICD-10-CM

## 2019-11-01 HISTORY — DX: Basal cell carcinoma of skin, unspecified: C44.91

## 2019-11-02 ENCOUNTER — Other Ambulatory Visit: Payer: Self-pay

## 2019-11-02 ENCOUNTER — Ambulatory Visit (HOSPITAL_COMMUNITY)
Admission: RE | Admit: 2019-11-02 | Discharge: 2019-11-02 | Disposition: A | Discharge status: Home / Self Care | Payer: PPO | Source: Ambulatory Visit | Attending: Nurse Practitioner | Admitting: Nurse Practitioner

## 2019-11-02 ENCOUNTER — Encounter (HOSPITAL_COMMUNITY): Payer: Self-pay | Admitting: Nurse Practitioner

## 2019-11-02 VITALS — BP 164/84 | HR 72 | Ht 72.0 in | Wt 222.8 lb

## 2019-11-02 DIAGNOSIS — N529 Male erectile dysfunction, unspecified: Secondary | ICD-10-CM | POA: Diagnosis not present

## 2019-11-02 DIAGNOSIS — Z7901 Long term (current) use of anticoagulants: Secondary | ICD-10-CM | POA: Diagnosis not present

## 2019-11-02 DIAGNOSIS — D6869 Other thrombophilia: Secondary | ICD-10-CM

## 2019-11-02 DIAGNOSIS — Z881 Allergy status to other antibiotic agents status: Secondary | ICD-10-CM | POA: Insufficient documentation

## 2019-11-02 DIAGNOSIS — Z888 Allergy status to other drugs, medicaments and biological substances status: Secondary | ICD-10-CM | POA: Diagnosis not present

## 2019-11-02 DIAGNOSIS — Z79899 Other long term (current) drug therapy: Secondary | ICD-10-CM | POA: Insufficient documentation

## 2019-11-02 DIAGNOSIS — Z87891 Personal history of nicotine dependence: Secondary | ICD-10-CM | POA: Diagnosis not present

## 2019-11-02 DIAGNOSIS — I4819 Other persistent atrial fibrillation: Secondary | ICD-10-CM | POA: Diagnosis not present

## 2019-11-02 DIAGNOSIS — I10 Essential (primary) hypertension: Secondary | ICD-10-CM | POA: Insufficient documentation

## 2019-11-02 DIAGNOSIS — Z79890 Hormone replacement therapy (postmenopausal): Secondary | ICD-10-CM | POA: Insufficient documentation

## 2019-11-02 LAB — CBC
HCT: 42.7 % (ref 39.0–52.0)
Hemoglobin: 14.4 g/dL (ref 13.0–17.0)
MCH: 36.2 pg — ABNORMAL HIGH (ref 26.0–34.0)
MCHC: 33.7 g/dL (ref 30.0–36.0)
MCV: 107.3 fL — ABNORMAL HIGH (ref 80.0–100.0)
Platelets: 205 10*3/uL (ref 150–400)
RBC: 3.98 MIL/uL — ABNORMAL LOW (ref 4.22–5.81)
RDW: 12.3 % (ref 11.5–15.5)
WBC: 5.2 10*3/uL (ref 4.0–10.5)
nRBC: 0 % (ref 0.0–0.2)

## 2019-11-02 LAB — BASIC METABOLIC PANEL
Anion gap: 10 (ref 5–15)
BUN: 10 mg/dL (ref 8–23)
CO2: 24 mmol/L (ref 22–32)
Calcium: 9.1 mg/dL (ref 8.9–10.3)
Chloride: 99 mmol/L (ref 98–111)
Creatinine, Ser: 0.95 mg/dL (ref 0.61–1.24)
GFR calc Af Amer: >60 mL/min (ref >60)
GFR calc non Af Amer: >60 mL/min (ref >60)
Glucose, Bld: 94 mg/dL (ref 70–99)
Potassium: 4.8 mmol/L (ref 3.5–5.1)
Sodium: 133 mmol/L — ABNORMAL LOW (ref 135–145)

## 2019-11-02 NOTE — Progress Notes (Signed)
Primary Care Physician: Valera Castle, MD Referring Physician: Dr. Camille Bal is a 69 y.o. male with a h/o persistent afib with termination pauses, failing flecainide, Rythmol and  Amiodarone, off all these meds currently. He is in the afib clinic for  follow up from ablation, 08/2018.  He is in SR. Had brief afib in November, 2020 . None since then. Went back on 25 mg daily of metoprolol at that time. No issues with eliquis with a CHA2DS2VASc score of 1-2(age/HTN?). His BP is elevated today, at home states around [id number] systolic.    Today, he denies symptoms of palpitations, chest pain, shortness of breath, orthopnea, PND, lower extremity edema, dizziness, presyncope, syncope, or neurologic sequela. The patient is tolerating medications without difficulties and is otherwise without complaint today.   Past Medical History:  Diagnosis Date  . Back pain    lower  . ED (erectile dysfunction)   . Enlarged prostate   . Essential hypertension   . Hypogonadism in male   . Paroxysmal A-fib ([place])    a. 07/2018 s/p PVI.   Past Surgical History:  Procedure Laterality Date  . ATRIAL FIBRILLATION ABLATION N/A 07/27/2018   Procedure: ATRIAL FIBRILLATION ABLATION;  Surgeon: Thompson Grayer, MD;  Location: [facility];  Service: Cardiovascular;  Laterality: N/A;  . CARDIOVERSION N/A 01/13/2018   Procedure: CARDIOVERSION;  Surgeon: Nelva Bush, MD;  Location: [facility];  Service: Cardiovascular;  Laterality: N/A;  . CARDIOVERSION N/A 01/26/2018   Procedure: CARDIOVERSION;  Surgeon: Minna Merritts, MD;  Location: [facility];  Service: Cardiovascular;  Laterality: N/A;  . CARDIOVERSION N/A 03/01/2018   Procedure: CARDIOVERSION;  Surgeon: Minna Merritts, MD;  Location: [facility];  Service: Cardiovascular;  Laterality: N/A;  . CARDIOVERSION N/A 05/21/2018   Procedure: CARDIOVERSION;  Surgeon: Wellington Hampshire, MD;  Location: [facility];  Service: Cardiovascular;  Laterality:  N/A;  . COLONOSCOPY  04/2005  . TONSILLECTOMY    . 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 acetaminophen (TYLENOL) 650 MG CR tablet Take 650 mg by mouth as needed for pain.     Marland Kitchen dutasteride (AVODART) 0.5 MG capsule TAKE 1 CAPSULE BY MOUTH DAILY. 90 capsule 1  . ELIQUIS 5 MG TABS tablet Take 1 tablet (5 mg total) by mouth 2 (two) times daily. 60 tablet 11  . Ipratropium-Albuterol (COMBIVENT RESPIMAT) 20-100 MCG/ACT AERS respimat Inhale 1 puff into the lungs every 6 (six) hours as needed for wheezing or shortness of breath.     . levothyroxine (SYNTHROID, LEVOTHROID) 25 MCG tablet Take 1 tablet (25 mcg total) by mouth daily before breakfast. 30 tablet 1  . metoprolol tartrate (LOPRESSOR) 25 MG tablet Take 25 mg by mouth daily.    . tamsulosin (FLOMAX) 0.4 MG CAPS capsule Take 1 capsule (0.4 mg total) by mouth daily. 90 capsule 3   No current facility-administered medications for this encounter.    Allergies  Allergen Reactions  . Statins Other (See Comments)    Patient declines any statins  . Ciprofloxacin Rash    Social History   Socioeconomic History  . Marital status: Married    Spouse name: Not on file  . Number of children: Not on file  . Years of  education: Not on file  . Highest education level: Not on file  Occupational History  . Not on file  Tobacco Use  . Smoking status: Former Smoker    Packs/day: 0.50    Types: Cigarettes    Quit date: 09/15/2008    Years since quitting: 11.1  . Smokeless tobacco: Never Used  Substance and Sexual Activity  . Alcohol use: Yes    Comment: on the weekend- has a few shots  . Drug use: No  . Sexual activity: Not on file  Other Topics Concern  . Not on file  Social History  Narrative   Lives in [place] with spouse   Owns rental property   Social Determinants of Health   Financial Resource Strain:   . Difficulty of Paying Living Expenses: Not on file  Food Insecurity:   . Worried About Charity fundraiser in the Last Year: Not on file  . Ran Out of Food in the Last Year: Not on file  Transportation Needs:   . Lack of Transportation (Medical): Not on file  . Lack of Transportation (Non-Medical): Not on file  Physical Activity:   . Days of Exercise per Week: Not on file  . Minutes of Exercise per Session: Not on file  Stress:   . Feeling of Stress : Not on file  Social Connections:   . Frequency of Communication with Friends and Family: Not on file  . Frequency of Social Gatherings with Friends and Family: Not on file  . Attends Religious Services: Not on file  . Active Member of Clubs or Organizations: Not on file  . Attends Archivist Meetings: Not on file  . Marital Status: Not on file  Intimate Partner Violence:   . Fear of Current or Ex-Partner: Not on file  . Emotionally Abused: Not on file  . Physically Abused: Not on file  . Sexually Abused: Not on file    Family History  Problem Relation Age of Onset  . Ovarian cancer Mother   . Throat cancer Father   . Prostate cancer Neg Hx   . Kidney cancer Neg Hx     ROS- All systems are reviewed and negative except as per the HPI above  Physical Exam: Vitals:   11/02/19 1055  BP: (!) 164/84  Pulse: 72  Weight: 101.1 kg  Height: 6' (1.829 m)   Wt Readings from Last 3 Encounters:  11/02/19 101.1 kg  09/23/19 99.5 kg  08/01/19 97.5 kg    Labs: Lab Results  Component Value Date   NA 137 07/22/2019   K 4.4 07/22/2019   CL 104 07/22/2019   CO2 21 07/22/2019   GLUCOSE 67 07/22/2019   BUN 10 07/22/2019   CREATININE 0.69 (L) 07/22/2019   CALCIUM 9.0 07/22/2019   Lab Results  Component Value Date   INR 0.97 10/29/2017   No results found for: CHOL, HDL, LDLCALC,  TRIG   GEN- The patient is well appearing, alert and oriented x 3 today.   Head- normocephalic, atraumatic Eyes-  Sclera clear, conjunctiva pink Ears- hearing intact Oropharynx- clear Neck- supple, no JVP Lymph- no cervical lymphadenopathy Lungs- Clear to ausculation bilaterally, normal work of breathing Heart- Regular rate and rhythm, no murmurs, rubs or gallops, PMI not laterally displaced GI- soft, NT, ND, + BS Extremities- no clubbing, cyanosis, or edema MS- no significant deformity or atrophy Skin- no rash or lesion Psych- euthymic mood, full affect Neuro- strength and sensation are intact  EKG-NSR at  72 bpm Epic records reviewed   Assessment and Plan: 1.Persistent afib Doing very well s/p ablation without any afib In SR today  Continue metoprolol 25 mg daily Continue  eliquis with a chadsvasc score of 1-2 Cbc/bmet today for being on long term anticoagulation   2. Elevated BP Rechecked and no change  States runs better at home He will check mid morning and mid afternoon and let me know in one week his readings and if additional meds are needed at that time   F/u in 6 months  Butch Penny C. Jayvion Stefanski, [facility] [address] [place], [place] [address] [phone]

## 2019-11-04 NOTE — Addendum Note (Signed)
Encounter addended by: Enid Derry, CMA on: 11/04/2019 10:00 AM  Actions taken: Order Reconciliation Section accessed

## 2019-11-11 DIAGNOSIS — L578 Other skin changes due to chronic exposure to nonionizing radiation: Secondary | ICD-10-CM | POA: Diagnosis not present

## 2019-11-11 DIAGNOSIS — C44311 Basal cell carcinoma of skin of nose: Secondary | ICD-10-CM | POA: Diagnosis not present

## 2019-11-11 DIAGNOSIS — C44212 Basal cell carcinoma of skin of right ear and external auricular canal: Secondary | ICD-10-CM | POA: Diagnosis not present

## 2019-11-14 ENCOUNTER — Telehealth (HOSPITAL_COMMUNITY): Payer: Self-pay | Admitting: *Deleted

## 2019-11-14 MED ORDER — LOSARTAN POTASSIUM 50 MG PO TABS: 50.0000 mg | ORAL_TABLET | Freq: Every day | ORAL | 6 refills | Status: DC

## 2019-11-14 NOTE — Telephone Encounter (Signed)
Pt has been keeping BP log since last visit. BP is ranging in the 150-169/88-90. HR in the 68-70s. Discussed with Roderic Palau NP will start Losartan 50mg  once a day with follow up BP/BMET in 10 days. Pt wife verbalized understanding.

## 2019-11-16 ENCOUNTER — Telehealth (HOSPITAL_COMMUNITY): Payer: Self-pay

## 2019-11-16 MED ORDER — LOSARTAN POTASSIUM 100 MG PO TABS: ORAL_TABLET | ORAL | 6 refills | Status: DC

## 2019-11-16 NOTE — Telephone Encounter (Signed)
Patients wife called and states he has been taking the Losartan 50mg  once daily she was wondering due to his blood pressure being up if he should go back to taking Losartan 50mg  twice daily.  Patients wife did not inform us that he has been taking the Losartan 50mg  once daily. Per Roderic Palau- NP- she would like to increase the Losartan 50mg  to twice daily. He will continue to use the Losartan 50mg  tablet and take twice daily. I have sent in a new prescription to his pharmacy for Losartan 100mg  tablet to take once daily. Consulted with patients wife and she verbalized understanding.

## 2019-11-23 DIAGNOSIS — B354 Tinea corporis: Secondary | ICD-10-CM | POA: Diagnosis not present

## 2019-11-25 ENCOUNTER — Other Ambulatory Visit (HOSPITAL_COMMUNITY): Payer: PPO | Admitting: Nurse Practitioner

## 2019-11-28 ENCOUNTER — Other Ambulatory Visit: Payer: Self-pay

## 2019-11-28 ENCOUNTER — Ambulatory Visit (HOSPITAL_COMMUNITY)
Admission: RE | Admit: 2019-11-28 | Discharge: 2019-11-28 | Disposition: A | Discharge status: Home / Self Care | Payer: PPO | Source: Ambulatory Visit | Attending: Nurse Practitioner | Admitting: Nurse Practitioner

## 2019-11-28 VITALS — BP 166/80

## 2019-11-28 DIAGNOSIS — I10 Essential (primary) hypertension: Secondary | ICD-10-CM | POA: Diagnosis not present

## 2019-11-28 LAB — BASIC METABOLIC PANEL
Anion gap: 5 (ref 5–15)
BUN: 11 mg/dL (ref 8–23)
CO2: 27 mmol/L (ref 22–32)
Calcium: 9.3 mg/dL (ref 8.9–10.3)
Chloride: 105 mmol/L (ref 98–111)
Creatinine, Ser: 0.92 mg/dL (ref 0.61–1.24)
GFR calc Af Amer: >60 mL/min (ref >60)
GFR calc non Af Amer: >60 mL/min (ref >60)
Glucose, Bld: 102 mg/dL — ABNORMAL HIGH (ref 70–99)
Potassium: 5.1 mmol/L (ref 3.5–5.1)
Sodium: 137 mmol/L (ref 135–145)

## 2019-11-28 MED ORDER — LOSARTAN POTASSIUM 100 MG PO TABS: 50.0000 mg | ORAL_TABLET | Freq: Two times a day (BID) | ORAL | 6 refills | Status: DC

## 2019-11-28 NOTE — Progress Notes (Signed)
Pt in for BP check since losartan was increased to 50 mg bid. Wife called in saying the majority of BP's were over [id number] systolic. BP at home running 120-140, a few readings at [id number] systolic. 150/82 today, states he has been rushing. Bmet done. He will continue to monitor at home and if majority of BP's are not less than [id number] systolic, he will call back to office for further med considerations.

## 2019-12-08 DIAGNOSIS — R358 Other polyuria: Secondary | ICD-10-CM | POA: Diagnosis not present

## 2019-12-08 DIAGNOSIS — F5109 Other insomnia not due to a substance or known physiological condition: Secondary | ICD-10-CM | POA: Diagnosis not present

## 2019-12-08 DIAGNOSIS — E7800 Pure hypercholesterolemia, unspecified: Secondary | ICD-10-CM | POA: Diagnosis not present

## 2019-12-08 DIAGNOSIS — E559 Vitamin D deficiency, unspecified: Secondary | ICD-10-CM | POA: Diagnosis not present

## 2019-12-08 DIAGNOSIS — R635 Abnormal weight gain: Secondary | ICD-10-CM | POA: Diagnosis not present

## 2019-12-08 DIAGNOSIS — R5383 Other fatigue: Secondary | ICD-10-CM | POA: Diagnosis not present

## 2019-12-08 DIAGNOSIS — D519 Vitamin B12 deficiency anemia, unspecified: Secondary | ICD-10-CM | POA: Diagnosis not present

## 2019-12-08 DIAGNOSIS — E291 Testicular hypofunction: Secondary | ICD-10-CM | POA: Diagnosis not present

## 2020-01-05 ENCOUNTER — Ambulatory Visit: Payer: PPO | Admitting: Dermatology

## 2020-01-20 ENCOUNTER — Telehealth (HOSPITAL_COMMUNITY): Payer: Self-pay | Admitting: *Deleted

## 2020-01-20 MED ORDER — METOPROLOL TARTRATE 25 MG PO TABS: 12.5000 mg | ORAL_TABLET | Freq: Every day | ORAL | 3 refills | Status: DC

## 2020-01-20 NOTE — Telephone Encounter (Signed)
Patient called in stating went into AF around 2am HR in the 90s. Bp 130/64. Pt took losartan at 2am and again at 730am with no relief. When asked about metoprolol - pt does not have prescription for this although on medication list. Wife read all of medications in his med box and they do not have metoprolol they are unsure when he ran out of it. Pt will start back metoprolol 12.5mg  BID today and call in on Monday with update of symptoms.  ER precautions were reviewed in case they have issues over weekend. Pt and wife in agreement.

## 2020-01-23 NOTE — Telephone Encounter (Signed)
Patient returned to NSR on Sunday morning feeling "great" HR 68 BP 131/80. Will call if further issues.

## 2020-02-10 ENCOUNTER — Emergency Department
Admission: EM | Admit: 2020-02-10 | Discharge: 2020-02-10 | Disposition: A | Discharge status: Home / Self Care | Payer: PPO | Attending: Emergency Medicine | Admitting: Emergency Medicine

## 2020-02-10 ENCOUNTER — Other Ambulatory Visit: Payer: Self-pay

## 2020-02-10 DIAGNOSIS — Z5321 Procedure and treatment not carried out due to patient leaving prior to being seen by health care provider: Secondary | ICD-10-CM | POA: Insufficient documentation

## 2020-02-10 DIAGNOSIS — R1030 Lower abdominal pain, unspecified: Secondary | ICD-10-CM | POA: Insufficient documentation

## 2020-02-10 LAB — COMPREHENSIVE METABOLIC PANEL
ALT: 22 U/L (ref 0–44)
AST: 17 U/L (ref 15–41)
Albumin: 4.2 g/dL (ref 3.5–5.0)
Alkaline Phosphatase: 66 U/L (ref 38–126)
Anion gap: 6 (ref 5–15)
BUN: 11 mg/dL (ref 8–23)
CO2: 26 mmol/L (ref 22–32)
Calcium: 9.2 mg/dL (ref 8.9–10.3)
Chloride: 103 mmol/L (ref 98–111)
Creatinine, Ser: 0.7 mg/dL (ref 0.61–1.24)
GFR calc Af Amer: >60 mL/min (ref >60)
GFR calc non Af Amer: >60 mL/min (ref >60)
Glucose, Bld: 109 mg/dL — ABNORMAL HIGH (ref 70–99)
Potassium: 4.7 mmol/L (ref 3.5–5.1)
Sodium: 135 mmol/L (ref 135–145)
Total Bilirubin: 1.2 mg/dL (ref 0.3–1.2)
Total Protein: 6.9 g/dL (ref 6.5–8.1)

## 2020-02-10 LAB — CBC
HCT: 44.2 % (ref 39.0–52.0)
Hemoglobin: 15.5 g/dL (ref 13.0–17.0)
MCH: 36.5 pg — ABNORMAL HIGH (ref 26.0–34.0)
MCHC: 35.1 g/dL (ref 30.0–36.0)
MCV: 104 fL — ABNORMAL HIGH (ref 80.0–100.0)
Platelets: 187 10*3/uL (ref 150–400)
RBC: 4.25 MIL/uL (ref 4.22–5.81)
RDW: 12.5 % (ref 11.5–15.5)
WBC: 9.8 10*3/uL (ref 4.0–10.5)
nRBC: 0 % (ref 0.0–0.2)

## 2020-02-10 LAB — URINALYSIS, COMPLETE (UACMP) WITH MICROSCOPIC
Bacteria, UA: NONE SEEN
Bilirubin Urine: NEGATIVE
Glucose, UA: NEGATIVE mg/dL
Hgb urine dipstick: NEGATIVE
Ketones, ur: NEGATIVE mg/dL
Leukocytes,Ua: NEGATIVE
Nitrite: NEGATIVE
Protein, ur: NEGATIVE mg/dL
Specific Gravity, Urine: 1.002 — ABNORMAL LOW (ref 1.005–1.030)
pH: 7 (ref 5.0–8.0)

## 2020-02-10 LAB — LIPASE, BLOOD: Lipase: 25 U/L (ref 11–51)

## 2020-02-10 MED ORDER — SODIUM CHLORIDE 0.9% FLUSH: 3.0000 mL | Freq: Once | INTRAVENOUS | Status: DC

## 2020-02-10 NOTE — ED Triage Notes (Signed)
Pt states that he has been having pain and pressure in the lower part of his abd points to low center abd, states some discomfort with using the bathroom but not much states he has been unable to rest due to it

## 2020-02-16 ENCOUNTER — Other Ambulatory Visit: Payer: Self-pay | Admitting: General Surgery

## 2020-02-16 DIAGNOSIS — K5732 Diverticulitis of large intestine without perforation or abscess without bleeding: Secondary | ICD-10-CM | POA: Diagnosis not present

## 2020-02-28 ENCOUNTER — Other Ambulatory Visit: Payer: Self-pay | Admitting: General Surgery

## 2020-02-28 DIAGNOSIS — K5732 Diverticulitis of large intestine without perforation or abscess without bleeding: Secondary | ICD-10-CM

## 2020-03-02 ENCOUNTER — Ambulatory Visit
Admission: RE | Admit: 2020-03-02 | Discharge: 2020-03-02 | Disposition: A | Discharge status: Home / Self Care | Payer: PPO | Source: Ambulatory Visit | Attending: General Surgery | Admitting: General Surgery

## 2020-03-02 ENCOUNTER — Other Ambulatory Visit: Payer: Self-pay

## 2020-03-02 DIAGNOSIS — R110 Nausea: Secondary | ICD-10-CM | POA: Diagnosis not present

## 2020-03-02 DIAGNOSIS — K5732 Diverticulitis of large intestine without perforation or abscess without bleeding: Secondary | ICD-10-CM

## 2020-03-02 MED ORDER — IOHEXOL 300 MG/ML  SOLN
100.0000 mL | Freq: Once | INTRAMUSCULAR | Status: AC | PRN
  Administered 2020-03-02: 100 mL via INTRAVENOUS

## 2020-03-13 ENCOUNTER — Encounter: Payer: Self-pay | Admitting: Urology

## 2020-03-13 ENCOUNTER — Ambulatory Visit: Payer: PPO | Admitting: Urology

## 2020-03-13 ENCOUNTER — Other Ambulatory Visit: Payer: Self-pay

## 2020-03-13 VITALS — BP 143/85 | HR 61 | Ht 72.0 in | Wt 215.0 lb

## 2020-03-13 DIAGNOSIS — N529 Male erectile dysfunction, unspecified: Secondary | ICD-10-CM | POA: Diagnosis not present

## 2020-03-13 DIAGNOSIS — N138 Other obstructive and reflux uropathy: Secondary | ICD-10-CM

## 2020-03-13 DIAGNOSIS — N401 Enlarged prostate with lower urinary tract symptoms: Secondary | ICD-10-CM

## 2020-03-13 DIAGNOSIS — R300 Dysuria: Secondary | ICD-10-CM | POA: Diagnosis not present

## 2020-03-13 LAB — URINALYSIS, COMPLETE
Bilirubin, UA: NEGATIVE
Glucose, UA: NEGATIVE
Ketones, UA: NEGATIVE
Leukocytes,UA: NEGATIVE
Nitrite, UA: NEGATIVE
Protein,UA: NEGATIVE
RBC, UA: NEGATIVE
Specific Gravity, UA: 1.015 (ref 1.005–1.030)
Urobilinogen, Ur: 0.2 mg/dL (ref 0.2–1.0)
pH, UA: 6 (ref 5.0–7.5)

## 2020-03-13 LAB — MICROSCOPIC EXAMINATION
Bacteria, UA: NONE SEEN
RBC, Urine: NONE SEEN /hpf (ref 0–2)

## 2020-03-13 LAB — BLADDER SCAN AMB NON-IMAGING: Scan Result: 0

## 2020-03-13 MED ORDER — URIBEL 118 MG PO CAPS: 1.0000 | ORAL_CAPSULE | Freq: Two times a day (BID) | ORAL | 0 refills | Status: DC | PRN

## 2020-03-13 NOTE — Progress Notes (Signed)
02/14/20 12:51 PM   Alexander Carey 1951/09/09 [id number]  Referring provider: Valera Castle, [address] [place],  [place] [address] Chief Complaint  Patient presents with  . Dysuria    HPI: Alexander Carey is a 69 y.o. male with BPH with LUTS who presents for painful urination.  He sought evaluation and treatment in the ED in 02/10/2020 for complaints of pain and pressure in his lower abdomen and discomfort when using the restroom.  His UA in the ED was unremarkable.   On 03/02/2020 he saw his surgeon Dr. Bary Castilla and a contrast CT was performed. It did not show any evidence of ureteral calculi or hydronephrosis.  There was evidence of diverticulitis and he was treated with antibiotics.  The abdominal pain has abated.    Today, he complains of a 2 week hx of suprapubic pain associated with urgency, frequency and dysuria.  This pain is different from the previous abdominal pain.  He is taking Flomax 0.4 mg daily and Avodart 0.5 mg daily.  He is not having a splitting of the urinary stream, but he is straining to urinate.  He is not having painful erections or ejaculations.   He has sex infrequently and only with his wife.  He denies any constipation or diarrhea.  Patient denies any modifying or aggravating factors.  Patient denies any gross hematuria, dysuria or suprapubic/flank pain.  Patient denies any fevers, chills, nausea or vomiting.   His UA today was unremarkable. His PVR was 0.    PMH: Past Medical History:  Diagnosis Date  . Back pain    lower  . ED (erectile dysfunction)   . Enlarged prostate   . Essential hypertension   . Hypogonadism in male   . Paroxysmal A-fib ([place])    a. 07/2018 s/p PVI.    Surgical History: Past Surgical History:  Procedure Laterality Date  . ATRIAL FIBRILLATION ABLATION N/A 07/27/2018   Procedure: ATRIAL FIBRILLATION ABLATION;  Surgeon: Thompson Grayer, MD;  Location: [facility];  Service: Cardiovascular;  Laterality: N/A;   . CARDIOVERSION N/A 01/13/2018   Procedure: CARDIOVERSION;  Surgeon: Nelva Bush, MD;  Location: [facility];  Service: Cardiovascular;  Laterality: N/A;  . CARDIOVERSION N/A 01/26/2018   Procedure: CARDIOVERSION;  Surgeon: Minna Merritts, MD;  Location: [facility];  Service: Cardiovascular;  Laterality: N/A;  . CARDIOVERSION N/A 03/01/2018   Procedure: CARDIOVERSION;  Surgeon: Minna Merritts, MD;  Location: [facility];  Service: Cardiovascular;  Laterality: N/A;  . CARDIOVERSION N/A 05/21/2018   Procedure: CARDIOVERSION;  Surgeon: Wellington Hampshire, MD;  Location: [facility];  Service: Cardiovascular;  Laterality: N/A;  . COLONOSCOPY  04/2005  . TONSILLECTOMY    . 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 If you have any questions, ask your nurse or doctor.        acetaminophen 650 MG CR tablet Commonly known as: TYLENOL Take 650 mg by mouth as needed for pain.   Combivent Respimat 20-100 MCG/ACT Aers respimat Generic  drug: Ipratropium-Albuterol Inhale 1 puff into the lungs every 6 (six) hours as needed for wheezing or shortness of breath.   dutasteride 0.5 MG capsule Commonly known as: AVODART TAKE 1 CAPSULE BY MOUTH DAILY.   Eliquis 5 MG Tabs tablet Generic drug: apixaban Take 1 tablet (5 mg total) by mouth 2 (two) times daily.   levothyroxine 25 MCG tablet Commonly known as: SYNTHROID Take 1 tablet (25 mcg total) by mouth daily before breakfast.   losartan 100 MG tablet Commonly known as: COZAAR Take 0.5 tablets (50 mg total) by mouth 2 (two) times daily.   metoprolol  tartrate 25 MG tablet Commonly known as: LOPRESSOR Take 0.5 tablets (12.5 mg total) by mouth daily.   tamsulosin 0.4 MG Caps capsule Commonly known as: FLOMAX Take 1 capsule (0.4 mg total) by mouth daily.   Uribel 118 MG Caps Take 1 capsule (118 mg total) by mouth 2 (two) times daily as needed. Started by: Zara Council, PA-C       Allergies:  Allergies  Allergen Reactions  . Statins Other (See Comments)    Patient declines any statins  . Ciprofloxacin Rash    Family History: Family History  Problem Relation Age of Onset  . Ovarian cancer Mother   . Throat cancer Father   . Prostate cancer Neg Hx   . Kidney cancer Neg Hx     Social History:  reports that he quit smoking about 11 years ago. His smoking use included cigarettes. He smoked 0.50 packs per day. He has never used smokeless tobacco. He reports current alcohol use. He reports that he does not use drugs.   Physical Exam: BP (!) 143/85   Pulse 61   Ht 6' (1.829 m)   Wt 215 lb (97.5 kg)   BMI 29.16 kg/m   Constitutional:  Well nourished. Alert and oriented, No acute distress. HEENT:  AT, mask in place Trachea midline, Cardiovascular: No clubbing, cyanosis, or edema. Respiratory: Normal respiratory effort, no increased work of breathing. GI: Abdomen is soft, non tender, non distended, no abdominal masses. Liver and spleen not palpable.  No hernias appreciated.  Stool sample for occult testing is not indicated.   GU: No CVA tenderness.  No bladder fullness or masses.  Patient with uncircumcised phallus. Foreskin easily retracted. Urethral meatus is patent.  No penile discharge. No penile lesions or rashes. Scrotum without lesions, cysts, rashes and/or edema.  Testicles are located scrotally bilaterally. No masses are appreciated in the testicles. Left and right epididymis are normal. Rectal: Patient with  normal sphincter tone. Anus and perineum without scarring or rashes. No rectal masses are appreciated.  Prostate is approximately 50 grams. Could only palpate the apex and the midportion of the gland. No nodules are appreciated. Seminal vesicles could not be palpated. Skin: No rashes, bruises or suspicious lesions. Lymph: No inguinal adenopathy. Neurologic: Grossly intact, no focal deficits, moving all 4 extremities. Psychiatric: Normal mood and affect.  Laboratory Data: Lab Results  Component Value Date   CREATININE 0.70 02/10/2020   Urinalysis Component     Latest Ref Rng & Units 03/13/2020  Specific Gravity, UA     1.005 - 1.030 1.015  pH, UA     5.0 - 7.5 6.0  Color, UA     Yellow Yellow  Appearance Ur     Clear Clear  Leukocytes,UA     Negative Negative  Protein,UA     Negative/Trace Negative  Glucose, UA     Negative Negative  Ketones,  UA     Negative Negative  RBC, UA     Negative Negative  Bilirubin, UA     Negative Negative  Urobilinogen, Ur     0.2 - 1.0 mg/dL 0.2  Nitrite, UA     Negative Negative  Microscopic Examination      See below:   Component     Latest Ref Rng & Units 03/13/2020  WBC, UA     0 - 5 /hpf 0-5  RBC     0 - 2 /hpf None seen  Epithelial Cells (non renal)     0 - 10 /hpf 0-10  Bacteria, UA     None seen/Few None seen   I have reviewed the labs.  Pertinent Imaging: Results for ARDON, FRANKLIN "PHIL" (MRN [id number]) as of 03/13/2020 12:52  Ref. Range 03/13/2020 10:47  Scan Result Unknown 0    Assessment & Plan:    1. Dysuria UA is benign, but sending for cultures to rule out indolent infection If urine cultures are negative, will schedule patient for cystoscopy I have explained to the patient that they will  be scheduled for a cystoscopy in our office to evaluate their bladder.  The cystoscopy consists of passing a tube with a lens up through their urethra and into their urinary bladder.   We will inject the urethra with a lidocaine gel prior to introducing the cystoscope to help with any discomfort during the procedure.   After  the procedure, they might experience blood in the urine and discomfort with urination.  This will abate after the first few voids.  I have  encouraged the patient to increase water intake  during this time.  Patient denies any allergies to lidocaine.  Patient given uribel samples (#6) for symptom management in the interim  2. BPH with LU TS Continue tamsulosin 0.4 mg daily and dutasteride 0.5 mg daily Due for follow up in 09/2020  3. ED No complaints at this time  [facility] [address] [place], [place] [address] [phone]  I, Joneen Boers Peace, am acting as a Education administrator for [employer].  I have reviewed the above documentation for accuracy and completeness, and I agree with the above.    Zara Council, PA-C

## 2020-03-14 ENCOUNTER — Other Ambulatory Visit (HOSPITAL_COMMUNITY): Payer: Self-pay | Admitting: *Deleted

## 2020-03-14 MED ORDER — METOPROLOL TARTRATE 25 MG PO TABS: 12.5000 mg | ORAL_TABLET | Freq: Two times a day (BID) | ORAL | 2 refills | Status: DC

## 2020-03-16 ENCOUNTER — Telehealth: Payer: Self-pay | Admitting: Family Medicine

## 2020-03-16 LAB — CULTURE, URINE COMPREHENSIVE

## 2020-03-16 NOTE — Telephone Encounter (Signed)
-----   Message from Nori Riis, PA-C sent at 03/16/2020  8:08 AM EDT ----- Please let Alexander Carey know that his urine culture was negative and we need to move forward with the cystoscopy.

## 2020-03-16 NOTE — Telephone Encounter (Signed)
Patient notified and voiced understanding.

## 2020-03-19 LAB — MYCOPLASMA / UREAPLASMA CULTURE
Mycoplasma hominis Culture: NEGATIVE
Ureaplasma urealyticum: NEGATIVE

## 2020-03-26 ENCOUNTER — Other Ambulatory Visit
Admission: RE | Admit: 2020-03-26 | Discharge: 2020-03-26 | Disposition: A | Discharge status: Home / Self Care | Payer: PPO | Source: Ambulatory Visit | Attending: General Surgery | Admitting: General Surgery

## 2020-03-26 ENCOUNTER — Other Ambulatory Visit: Payer: Self-pay

## 2020-03-26 DIAGNOSIS — Z01812 Encounter for preprocedural laboratory examination: Secondary | ICD-10-CM | POA: Insufficient documentation

## 2020-03-26 DIAGNOSIS — Z20822 Contact with and (suspected) exposure to covid-19: Secondary | ICD-10-CM | POA: Diagnosis not present

## 2020-03-26 LAB — SARS CORONAVIRUS 2 (TAT 6-24 HRS): SARS Coronavirus 2: NEGATIVE

## 2020-03-27 ENCOUNTER — Encounter: Payer: Self-pay | Admitting: General Surgery

## 2020-03-28 ENCOUNTER — Ambulatory Visit: Payer: PPO | Admitting: Certified Registered Nurse Anesthetist

## 2020-03-28 ENCOUNTER — Encounter
Admission: RE | Disposition: A | Discharge status: Home / Self Care | Payer: Self-pay | Source: Home / Self Care | Attending: General Surgery

## 2020-03-28 ENCOUNTER — Other Ambulatory Visit: Payer: Self-pay

## 2020-03-28 ENCOUNTER — Encounter: Payer: Self-pay | Admitting: General Surgery

## 2020-03-28 ENCOUNTER — Ambulatory Visit
Admission: RE | Admit: 2020-03-28 | Discharge: 2020-03-28 | Disposition: A | Discharge status: Home / Self Care | Payer: PPO | Attending: General Surgery | Admitting: General Surgery

## 2020-03-28 DIAGNOSIS — Z888 Allergy status to other drugs, medicaments and biological substances status: Secondary | ICD-10-CM | POA: Diagnosis not present

## 2020-03-28 DIAGNOSIS — K5790 Diverticulosis of intestine, part unspecified, without perforation or abscess without bleeding: Secondary | ICD-10-CM | POA: Diagnosis not present

## 2020-03-28 DIAGNOSIS — K635 Polyp of colon: Secondary | ICD-10-CM | POA: Diagnosis not present

## 2020-03-28 DIAGNOSIS — Z881 Allergy status to other antibiotic agents status: Secondary | ICD-10-CM | POA: Insufficient documentation

## 2020-03-28 DIAGNOSIS — I480 Paroxysmal atrial fibrillation: Secondary | ICD-10-CM | POA: Insufficient documentation

## 2020-03-28 DIAGNOSIS — D125 Benign neoplasm of sigmoid colon: Secondary | ICD-10-CM | POA: Insufficient documentation

## 2020-03-28 DIAGNOSIS — I10 Essential (primary) hypertension: Secondary | ICD-10-CM | POA: Diagnosis not present

## 2020-03-28 DIAGNOSIS — Z79899 Other long term (current) drug therapy: Secondary | ICD-10-CM | POA: Diagnosis not present

## 2020-03-28 DIAGNOSIS — E039 Hypothyroidism, unspecified: Secondary | ICD-10-CM | POA: Insufficient documentation

## 2020-03-28 DIAGNOSIS — Z7901 Long term (current) use of anticoagulants: Secondary | ICD-10-CM | POA: Insufficient documentation

## 2020-03-28 DIAGNOSIS — Z1211 Encounter for screening for malignant neoplasm of colon: Secondary | ICD-10-CM | POA: Insufficient documentation

## 2020-03-28 DIAGNOSIS — D123 Benign neoplasm of transverse colon: Secondary | ICD-10-CM | POA: Insufficient documentation

## 2020-03-28 DIAGNOSIS — N400 Enlarged prostate without lower urinary tract symptoms: Secondary | ICD-10-CM | POA: Diagnosis not present

## 2020-03-28 DIAGNOSIS — Z87891 Personal history of nicotine dependence: Secondary | ICD-10-CM | POA: Diagnosis not present

## 2020-03-28 DIAGNOSIS — K5730 Diverticulosis of large intestine without perforation or abscess without bleeding: Secondary | ICD-10-CM | POA: Diagnosis not present

## 2020-03-28 DIAGNOSIS — K5792 Diverticulitis of intestine, part unspecified, without perforation or abscess without bleeding: Secondary | ICD-10-CM | POA: Diagnosis not present

## 2020-03-28 DIAGNOSIS — G4730 Sleep apnea, unspecified: Secondary | ICD-10-CM | POA: Diagnosis not present

## 2020-03-28 HISTORY — DX: Unspecified atrial fibrillation: I48.91

## 2020-03-28 HISTORY — DX: Hypothyroidism, unspecified: E03.9

## 2020-03-28 HISTORY — PX: COLONOSCOPY WITH PROPOFOL: SHX5780

## 2020-03-28 SURGERY — COLONOSCOPY WITH PROPOFOL
Anesthesia: General

## 2020-03-28 MED ORDER — LIDOCAINE HCL (PF) 2 % IJ SOLN
INTRAMUSCULAR | Status: AC
  Filled 2020-03-28: qty 15

## 2020-03-28 MED ORDER — GLYCOPYRROLATE 0.2 MG/ML IJ SOLN
INTRAMUSCULAR | Status: AC
  Filled 2020-03-28: qty 2

## 2020-03-28 MED ORDER — PROPOFOL 10 MG/ML IV BOLUS
INTRAVENOUS | Status: DC | PRN
  Administered 2020-03-28: 80 mg via INTRAVENOUS

## 2020-03-28 MED ORDER — PROPOFOL 500 MG/50ML IV EMUL
INTRAVENOUS | Status: AC
  Filled 2020-03-28: qty 300

## 2020-03-28 MED ORDER — PROPOFOL 500 MG/50ML IV EMUL
INTRAVENOUS | Status: DC | PRN
  Administered 2020-03-28: 150 ug/kg/min via INTRAVENOUS

## 2020-03-28 MED ORDER — PHENYLEPHRINE HCL (PRESSORS) 10 MG/ML IV SOLN
INTRAVENOUS | Status: AC
  Filled 2020-03-28: qty 1

## 2020-03-28 MED ORDER — ELIQUIS 5 MG PO TABS: 5.0000 mg | ORAL_TABLET | Freq: Two times a day (BID) | ORAL | 11 refills | Status: DC

## 2020-03-28 MED ORDER — SODIUM CHLORIDE 0.9 % IV SOLN: INTRAVENOUS | Status: DC

## 2020-03-28 MED ORDER — PROPOFOL 10 MG/ML IV BOLUS
INTRAVENOUS | Status: AC
  Filled 2020-03-28: qty 20

## 2020-03-28 NOTE — H&P (Signed)
Alexander Carey [id number] [birth year]     HPI:  69 y/o male with recent episode of diverticulitis.  Post CT showed thickening of the sigmoid without evidence of active infection. For screening colonoscopy. Tolerated prep well.   Medications Prior to Admission  Medication Sig Dispense Refill Last Dose  . dutasteride (AVODART) 0.5 MG capsule TAKE 1 CAPSULE BY MOUTH DAILY. 90 capsule 1 03/27/2020 at Unknown time  . Ipratropium-Albuterol (COMBIVENT RESPIMAT) 20-100 MCG/ACT AERS respimat Inhale 1 puff into the lungs every 6 (six) hours as needed for wheezing or shortness of breath.      . levothyroxine (SYNTHROID, LEVOTHROID) 25 MCG tablet Take 1 tablet (25 mcg total) by mouth daily before breakfast. 30 tablet 1 03/27/2020 at Unknown time  . Meth-Hyo-M Bl-Na Phos-Ph Sal (URIBEL) 118 MG CAPS Take 1 capsule (118 mg total) by mouth 2 (two) times daily as needed. 6 capsule 0   . tamsulosin (FLOMAX) 0.4 MG CAPS capsule Take 1 capsule (0.4 mg total) by mouth daily. 90 capsule 3 03/27/2020 at Unknown time  . acetaminophen (TYLENOL) 650 MG CR tablet Take 650 mg by mouth as needed for pain.      Marland Kitchen ELIQUIS 5 MG TABS tablet Take 1 tablet (5 mg total) by mouth 2 (two) times daily. 60 tablet 11 03/25/2020  . losartan (COZAAR) 100 MG tablet Take 0.5 tablets (50 mg total) by mouth 2 (two) times daily. 60 tablet 6 03/25/2020  . metoprolol tartrate (LOPRESSOR) 25 MG tablet Take 0.5 tablets (12.5 mg total) by mouth 2 (two) times daily. 90 tablet 2 03/25/2020   Allergies  Allergen Reactions  . Statins Other (See Comments)    Patient declines any statins  . Ciprofloxacin Rash   Past Medical History:  Diagnosis Date  . AF (atrial fibrillation) ([place])   . Back pain    lower  . ED (erectile dysfunction)   . ED (erectile dysfunction)   . Enlarged prostate   . Essential hypertension   . Hypogonadism in male   . Hypothyroidism   . Paroxysmal A-fib ([place])    a. 07/2018 s/p PVI.   Past Surgical History:  Procedure  Laterality Date  . ATRIAL FIBRILLATION ABLATION N/A 07/27/2018   Procedure: ATRIAL FIBRILLATION ABLATION;  Surgeon: Thompson Grayer, MD;  Location: [facility];  Service: Cardiovascular;  Laterality: N/A;  . CARDIOVERSION N/A 01/13/2018   Procedure: CARDIOVERSION;  Surgeon: Nelva Bush, MD;  Location: [facility];  Service: Cardiovascular;  Laterality: N/A;  . CARDIOVERSION N/A 01/26/2018   Procedure: CARDIOVERSION;  Surgeon: Minna Merritts, MD;  Location: [facility];  Service: Cardiovascular;  Laterality: N/A;  . CARDIOVERSION N/A 03/01/2018   Procedure: CARDIOVERSION;  Surgeon: Minna Merritts, MD;  Location: [facility];  Service: Cardiovascular;  Laterality: N/A;  . CARDIOVERSION N/A 05/21/2018   Procedure: CARDIOVERSION;  Surgeon: Wellington Hampshire, MD;  Location: [facility];  Service: Cardiovascular;  Laterality: N/A;  . COLONOSCOPY  04/2005  . TONSILLECTOMY    . 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 Marital status: Married    Spouse name: Not on file  . Number of children: Not on file  . Years of education: Not on file  . Highest education level: Not on file  Occupational History  . Not on  file  Tobacco Use  . Smoking status: Former Smoker    Packs/day: 0.50    Types: Cigarettes    Quit date: 09/15/2008    Years since quitting: 11.5  . Smokeless tobacco: Never Used  Vaping Use  . Vaping Use: Never used  Substance and Sexual Activity  . Alcohol use: Yes    Comment: on the weekend- has a few shots  . Drug use: No  . Sexual activity: Not on file  Other Topics Concern  . Not on file  Social History Narrative   Lives in [place] with spouse   Owns rental property   Social Determinants of Health   Financial Resource Strain:   . Difficulty of  Paying Living Expenses:   Food Insecurity:   . Worried About Charity fundraiser in the Last Year:   . Arboriculturist in the Last Year:   Transportation Needs:   . Film/video editor (Medical):   Marland Kitchen Lack of Transportation (Non-Medical):   Physical Activity:   . Days of Exercise per Week:   . Minutes of Exercise per Session:   Stress:   . Feeling of Stress :   Social Connections:   . Frequency of Communication with Friends and Family:   . Frequency of Social Gatherings with Friends and Family:   . Attends Religious Services:   . Active Member of Clubs or Organizations:   . Attends Archivist Meetings:   Marland Kitchen Marital Status:   Intimate Partner Violence:   . Fear of Current or Ex-Partner:   . Emotionally Abused:   Marland Kitchen Physically Abused:   . Sexually Abused:    Social History   Social History Narrative   Lives in [place] with spouse   Owns rental property     ROS: Negative.     PE: HEENT: Negative. Lungs: Clear. Cardio: RR.  Assessment/Plan:  Proceed with planned endoscopy.    Forest Gleason [facility] 03/28/2020

## 2020-03-28 NOTE — Anesthesia Postprocedure Evaluation (Signed)
Anesthesia Post Note  Patient: Alexander Carey  Procedure(s) Performed: COLONOSCOPY WITH PROPOFOL (N/A )  Patient location during evaluation: Endoscopy Anesthesia Type: General Level of consciousness: awake and alert Pain management: pain level controlled Vital Signs Assessment: post-procedure vital signs reviewed and stable Respiratory status: spontaneous breathing and respiratory function stable Cardiovascular status: stable Anesthetic complications: no   No complications documented.   Last Vitals:  Vitals:   03/28/20 0810 03/28/20 0830  BP: 115/67 (!) 166/102  Pulse:    Resp:    Temp: (!) 35.6 C   SpO2:      Last Pain:  Vitals:   03/28/20 0830  TempSrc:   PainSc: 0-No pain                 Darlene Brozowski K

## 2020-03-28 NOTE — Anesthesia Preprocedure Evaluation (Signed)
Anesthesia Evaluation  Patient identified by MRN, date of birth, ID band Patient awake    Reviewed: Allergy & Precautions, NPO status , Patient's Chart, lab work & pertinent test results, reviewed documented beta blocker date and time   History of Anesthesia Complications Negative for: history of anesthetic complications  Airway Mallampati: II       Dental   Pulmonary sleep apnea (borderline, not using CPAP) , neg COPD, former smoker,           Cardiovascular hypertension, Pt. on medications and Pt. on home beta blockers (-) Past MI and (-) CHF (-) dysrhythmias (-) Valvular Problems/Murmurs     Neuro/Psych neg Seizures    GI/Hepatic Neg liver ROS, neg GERD  ,  Endo/Other  neg diabetesHypothyroidism   Renal/GU negative Renal ROS     Musculoskeletal   Abdominal   Peds  Hematology   Anesthesia Other Findings   Reproductive/Obstetrics                             Anesthesia Physical Anesthesia Plan  ASA: III  Anesthesia Plan: General   Post-op Pain Management:    Induction: Intravenous  PONV Risk Score and Plan: 2  Airway Management Planned: Nasal Cannula  Additional Equipment:   Intra-op Plan:   Post-operative Plan:   Informed Consent: I have reviewed the patients History and Physical, chart, labs and discussed the procedure including the risks, benefits and alternatives for the proposed anesthesia with the patient or authorized representative who has indicated his/her understanding and acceptance.       Plan Discussed with:   Anesthesia Plan Comments:         Anesthesia Quick Evaluation

## 2020-03-28 NOTE — Op Note (Signed)
[facility] Gastroenterology Patient Name: Alexander Carey Procedure Date: 03/28/2020 7:20 AM MRN: [id number] Account #: [id number] Date of Birth: 1951/01/07 Admit Type: Outpatient Age: 69 Room: [facility] ENDO ROOM 1 Gender: Male Note Status: Finalized Procedure:             Colonoscopy Indications:           Screening for colorectal malignant neoplasm Providers:             Robert Bellow, MD Medicines:             Monitored Anesthesia Care Complications:         No immediate complications. Procedure:             Pre-Anesthesia Assessment:                        - The risks and benefits of the procedure and the                         sedation options and risks were discussed with the                         patient. All questions were answered and informed                         consent was obtained.                        After obtaining informed consent, the colonoscope was                         passed under direct vision. Throughout the procedure,                         the patient's blood pressure, pulse, and oxygen                         saturations were monitored continuously. The                         Colonoscope was introduced through the anus and                         advanced to the the cecum, identified by appendiceal                         orifice and ileocecal valve. The colonoscopy was                         performed without difficulty. The patient tolerated                         the procedure well. The quality of the bowel                         preparation was excellent. Findings:      Two sessile polyps were found in the hepatic flexure. The polyps were 5       mm in size. These polyps were removed with a cold snare. Resection and       retrieval  were complete.      Two sessile polyps were found in the sigmoid colon. The polyps were 5 to       10 mm in size. These polyps were removed with a hot snare. Resection and       retrieval  were complete.      The retroflexed view of the distal rectum and anal verge was normal and       showed no anal or rectal abnormalities.      A few small-mouthed diverticula were found in the sigmoid colon. Impression:            - Two 5 mm polyps at the hepatic flexure, removed with                         a cold snare. Resected and retrieved.                        - Two 5 to 10 mm polyps in the sigmoid colon, removed                         with a hot snare. Resected and retrieved.                        - The distal rectum and anal verge are normal on                         retroflexion view.                        - Diverticulosis in the sigmoid colon. Recommendation:        - Telephone endoscopist for pathology results in 1                         week. Procedure Code(s):     --- Professional ---                        [phone], Colonoscopy, flexible; with removal of                         tumor(s), polyp(s), or other lesion(s) by snare                         technique Diagnosis Code(s):     --- Professional ---                        Z12.11, Encounter for screening for malignant neoplasm                         of colon                        K63.5, Polyp of colon                        K57.30, Diverticulosis of large intestine without                         perforation or abscess without bleeding CPT copyright 2019 American Medical Association. All rights reserved. The codes documented  in this report are preliminary and upon coder review may  be revised to meet current compliance requirements. Robert Bellow, MD 03/28/2020 8:10:38 AM This report has been signed electronically. Number of Addenda: 0 Note Initiated On: 03/28/2020 7:20 AM Scope Withdrawal Time: 0 hours 14 minutes 6 seconds  Total Procedure Duration: 0 hours 21 minutes 2 seconds  Estimated Blood Loss:  Estimated blood loss: none.      [facility]

## 2020-03-28 NOTE — Transfer of Care (Signed)
Immediate Anesthesia Transfer of Care Note  Patient: Alexander Carey  Procedure(s) Performed: COLONOSCOPY WITH PROPOFOL (N/A )  Patient Location: PACU  Anesthesia Type:General  Level of Consciousness: drowsy  Airway & Oxygen Therapy: Patient Spontanous Breathing and Patient connected to nasal cannula oxygen  Post-op Assessment: Report given to RN and Post -op Vital signs reviewed and stable  Post vital signs: Reviewed and stable  Last Vitals:  Vitals Value Taken Time  BP 115/67 03/28/20 0810  Temp    Pulse 70 03/28/20 0811  Resp 15 03/28/20 0811  SpO2 99 % 03/28/20 0811  Vitals shown include unvalidated device data.  Last Pain:  Vitals:   03/28/20 0717  TempSrc: Temporal  PainSc: 0-No pain         Complications: No complications documented.

## 2020-03-29 ENCOUNTER — Encounter: Payer: Self-pay | Admitting: General Surgery

## 2020-03-29 LAB — SURGICAL PATHOLOGY

## 2020-04-02 ENCOUNTER — Telehealth: Payer: Self-pay | Admitting: Urology

## 2020-04-02 NOTE — Telephone Encounter (Signed)
Pt's wife called office to cancel cysto appt w/Stoio[facility].  She wanted Larene Beach to know that he went for colonoscopy and they removed some polyps.  If she still thinks pt needs the cysto, they will call back to r/s.

## 2020-04-02 NOTE — Telephone Encounter (Signed)
Yes, he still needs the cystoscopy.  Colon polyps would not cause his urinary symptoms.

## 2020-04-06 ENCOUNTER — Other Ambulatory Visit: Payer: Self-pay | Admitting: Urology

## 2020-04-09 ENCOUNTER — Other Ambulatory Visit: Payer: Self-pay | Admitting: Internal Medicine

## 2020-04-09 ENCOUNTER — Other Ambulatory Visit: Payer: Self-pay | Admitting: Urology

## 2020-04-09 NOTE — Telephone Encounter (Signed)
Prescription refill request for Eliquis received.  Last office visit: 11/02/2019 Scr: 0.70, 02/10/2020 Age: 69 y.o. Weight: 97.5 kg   Prescription refill sent.

## 2020-04-11 NOTE — Telephone Encounter (Signed)
Contacted patient to advise him that he should still have the cysto with Dr. Bernardo Heater. I let him know that Shannon's message said that colon polps would not cause his urinary symptoms.    Patient states that his urinary symptoms have resolved completely and he does not want to schedule a cysto at this time.  I confirmed his next follow up appointment with Larene Beach in January and to contact the office if he changes his mind or if his symptoms resume.

## 2020-05-03 DIAGNOSIS — R0981 Nasal congestion: Secondary | ICD-10-CM | POA: Diagnosis not present

## 2020-05-03 DIAGNOSIS — R05 Cough: Secondary | ICD-10-CM | POA: Diagnosis not present

## 2020-05-11 ENCOUNTER — Ambulatory Visit: Payer: PPO | Admitting: Internal Medicine

## 2020-05-14 ENCOUNTER — Ambulatory Visit: Payer: PPO | Admitting: Dermatology

## 2020-05-15 DIAGNOSIS — M1711 Unilateral primary osteoarthritis, right knee: Secondary | ICD-10-CM | POA: Diagnosis not present

## 2020-06-19 DIAGNOSIS — U099 Post covid-19 condition, unspecified: Secondary | ICD-10-CM | POA: Diagnosis not present

## 2020-06-19 DIAGNOSIS — J9801 Acute bronchospasm: Secondary | ICD-10-CM | POA: Diagnosis not present

## 2020-06-19 DIAGNOSIS — J22 Unspecified acute lower respiratory infection: Secondary | ICD-10-CM | POA: Diagnosis not present

## 2020-06-19 DIAGNOSIS — R0981 Nasal congestion: Secondary | ICD-10-CM | POA: Diagnosis not present

## 2020-07-02 ENCOUNTER — Other Ambulatory Visit: Payer: Self-pay | Admitting: Urology

## 2020-07-04 ENCOUNTER — Ambulatory Visit: Payer: PPO | Admitting: Internal Medicine

## 2020-07-04 ENCOUNTER — Other Ambulatory Visit: Payer: Self-pay

## 2020-07-04 ENCOUNTER — Encounter: Payer: Self-pay | Admitting: Internal Medicine

## 2020-07-04 VITALS — BP 148/82 | HR 67 | Ht 72.0 in | Wt 225.8 lb

## 2020-07-04 DIAGNOSIS — G4733 Obstructive sleep apnea (adult) (pediatric): Secondary | ICD-10-CM

## 2020-07-04 DIAGNOSIS — I4819 Other persistent atrial fibrillation: Secondary | ICD-10-CM

## 2020-07-04 DIAGNOSIS — I10 Essential (primary) hypertension: Secondary | ICD-10-CM | POA: Diagnosis not present

## 2020-07-04 NOTE — Patient Instructions (Signed)
Medication Instructions:  Stop Metoprolol  *If you need a refill on your cardiac medications before your next appointment, please call your pharmacy*   Lab Work: none If you have labs (blood work) drawn today and your tests are completely normal, you will receive your results only by: Marland Kitchen MyChart Message (if you have MyChart) OR . A paper copy in the mail If you have any lab test that is abnormal or we need to change your treatment, we will call you to review the results.   Testing/Procedures: none   Follow-Up: At [facility], you and your health needs are our priority.  As part of our continuing mission to provide you with exceptional heart care, we have created designated Provider Care Teams.  These Care Teams include your primary Cardiologist (physician) and Advanced Practice Providers (APPs -  Physician Assistants and Nurse Practitioners) who all work together to provide you with the care you need, when you need it.  We recommend signing up for the patient portal called "MyChart".  Sign up information is provided on this After Visit Summary.  MyChart is used to connect with patients for Virtual Visits (Telemedicine).  Patients are able to view lab/test results, encounter notes, upcoming appointments, etc.  Non-urgent messages can be sent to your provider as well.   To learn more about what you can do with MyChart, go to [email or web address].    Your next appointment:   6 month(s)  The format for your next appointment:   In Person  Provider:   Thompson Grayer, MD

## 2020-07-04 NOTE — Progress Notes (Signed)
PCP: Valera Castle, MD   Primary EP: Dr Camille Bal is a 69 y.o. male who presents today for routine electrophysiology followup.  Since last being seen in our clinic, the patient reports doing very well.  Today, he denies symptoms of palpitations, chest pain, shortness of breath,  lower extremity edema, dizziness, presyncope, or syncope.  The patient is otherwise without complaint today.   Past Medical History:  Diagnosis Date  . AF (atrial fibrillation) ([place])   . Back pain    lower  . Basal cell carcinoma 11/01/2019   nasal tip, right ear concha  . ED (erectile dysfunction)   . ED (erectile dysfunction)   . Enlarged prostate   . Essential hypertension   . Hypogonadism in male   . Hypothyroidism   . Paroxysmal A-fib ([place])    a. 07/2018 s/p PVI.   Past Surgical History:  Procedure Laterality Date  . ATRIAL FIBRILLATION ABLATION N/A 07/27/2018   Procedure: ATRIAL FIBRILLATION ABLATION;  Surgeon: Thompson Grayer, MD;  Location: [place] [facility];  Service: Cardiovascular;  Laterality: N/A;  . CARDIOVERSION N/A 01/13/2018   Procedure: CARDIOVERSION;  Surgeon: Nelva Bush, MD;  Location: [place] [facility];  Service: Cardiovascular;  Laterality: N/A;  . CARDIOVERSION N/A 01/26/2018   Procedure: CARDIOVERSION;  Surgeon: Minna Merritts, MD;  Location: [facility];  Service: Cardiovascular;  Laterality: N/A;  . CARDIOVERSION N/A 03/01/2018   Procedure: CARDIOVERSION;  Surgeon: Minna Merritts, MD;  Location: [facility];  Service: Cardiovascular;  Laterality: N/A;  . CARDIOVERSION N/A 05/21/2018   Procedure: CARDIOVERSION;  Surgeon: Wellington Hampshire, MD;  Location: [facility];  Service: Cardiovascular;  Laterality: N/A;  . COLONOSCOPY  04/2005  . COLONOSCOPY WITH PROPOFOL N/A 03/28/2020   Procedure: COLONOSCOPY WITH PROPOFOL;  Surgeon: Robert Bellow, MD;  Location: [facility] ENDOSCOPY;  Service: Endoscopy;  Laterality: N/A;  . TONSILLECTOMY    . 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 acetaminophen (TYLENOL) 650 MG CR tablet Take 650 mg by mouth as needed for pain.     Marland Kitchen dutasteride (AVODART) 0.5 MG capsule TAKE 1 CAPSULE BY MOUTH DAILY. 90 capsule 0  . ELIQUIS 5 MG TABS tablet Take 1 tablet (5 mg total) by mouth 2 (two) times daily. 180 tablet 1  . Ipratropium-Albuterol (COMBIVENT RESPIMAT) 20-100 MCG/ACT AERS respimat Inhale 1 puff into the lungs every 6 (six) hours as needed for wheezing or shortness of breath.     . levothyroxine (SYNTHROID, LEVOTHROID) 25 MCG tablet Take 1 tablet (25 mcg total) by mouth daily before breakfast. 30 tablet 1  . losartan (COZAAR) 100 MG tablet Take 0.5 tablets (50 mg total) by mouth 2 (two) times daily. 60 tablet 6  . Meth-Hyo-M Bl-Na Phos-Ph Sal (URIBEL) 118 MG CAPS Take 1 capsule (118 mg total) by mouth 2 (two) times daily as needed. 6 capsule 0  . metoprolol tartrate (LOPRESSOR) 25 MG tablet Take 0.5 tablets (12.5 mg total) by mouth 2 (two) times daily. 90 tablet 2  . tamsulosin (FLOMAX) 0.4 MG CAPS capsule Take 1 capsule (0.4 mg total) by mouth daily. 90 capsule 3   No current facility-administered medications for this  visit.    Physical Exam: Vitals:   07/04/20 1147  BP: (!) 148/82  Pulse: 67  SpO2: 95%  Weight: 225 lb 12.8 oz (102.4 kg)  Height: 6' (1.829 m)    GEN- The patient is well appearing, alert and oriented x 3 today.   Head- normocephalic, atraumatic Eyes-  Sclera clear, conjunctiva pink Ears- hearing intact Oropharynx- clear Lungs- Clear to ausculation bilaterally, normal work of breathing Heart- Regular rate and rhythm, no murmurs, rubs or gallops, PMI not laterally displaced GI-  soft, NT, ND, + BS Extremities- no clubbing, cyanosis, or edema  Wt Readings from Last 3 Encounters:  07/04/20 225 lb 12.8 oz (102.4 kg)  03/28/20 215 lb (97.5 kg)  03/13/20 215 lb (97.5 kg)    EKG tracing ordered today is personally reviewed and shows sinus  Assessment and Plan:  1. Persistent afib chads2vasc score is 2.  He is on eliquis No afib post ablation off AAD therapy Stop metoprolol today Could consider ILR with long term monitoring vs REACT AF trial as alternatives to [facility] therapy  2. HTN Stable No change required today  3. OSA Mild Has dental prostesis followed by Dr Ron Parker  4. Post termination pauses  resolved  Risks, benefits and potential toxicities for medications prescribed and/or refilled reviewed with patient today.   Return in 6 months  Thompson Grayer MD, [facility] 07/04/2020 12:03 PM

## 2020-07-23 ENCOUNTER — Other Ambulatory Visit: Payer: Self-pay | Admitting: Internal Medicine

## 2020-07-24 NOTE — Telephone Encounter (Signed)
Pt last saw Dr Rayann Heman 07/04/20, last labs 02/10/20 Creat 0.70, age 69, weight 102.4kg, based on specified criteria pt is on appropriate dosage of Eliquis 5mg  BID.  Will refill rx.

## 2020-09-10 ENCOUNTER — Other Ambulatory Visit: Payer: Self-pay | Admitting: Urology

## 2020-09-11 DIAGNOSIS — M25561 Pain in right knee: Secondary | ICD-10-CM | POA: Diagnosis not present

## 2020-09-17 ENCOUNTER — Other Ambulatory Visit: Payer: Self-pay | Admitting: Family Medicine

## 2020-09-18 ENCOUNTER — Other Ambulatory Visit: Payer: Self-pay

## 2020-09-18 ENCOUNTER — Other Ambulatory Visit
Admission: RE | Admit: 2020-09-18 | Discharge: 2020-09-18 | Disposition: A | Discharge status: Home / Self Care | Payer: PPO | Attending: Urology | Admitting: Urology

## 2020-09-18 DIAGNOSIS — N138 Other obstructive and reflux uropathy: Secondary | ICD-10-CM

## 2020-09-18 DIAGNOSIS — N401 Enlarged prostate with lower urinary tract symptoms: Secondary | ICD-10-CM | POA: Diagnosis not present

## 2020-09-18 LAB — PSA: Prostatic Specific Antigen: 0.12 ng/mL (ref 0.00–4.00)

## 2020-09-23 NOTE — Progress Notes (Unsigned)
02/14/20 9:45 AM   Alexander Carey June 21, 1951 [id number]  Referring provider: Valera Castle, [address] [place],  [place] [address] Chief Complaint  Patient presents with  . Benign Prostatic Hypertrophy   Urological History 1. BPH with LU TS I PSS: 7/1 PVR: 0 mL PSA 0.12 09/2020 Taking dutasteride 0.5 mg daily and tamsulosin 0.4 mg daily  2. ED SHIM: 18 Risk factors: former smoker, sleep apnea, BPH, age and anticoagulants  PDE5i's moderate effect  HPI: Alexander Carey is a 70 y.o. male who presents today for a yearly follow up.    He was having issues with dysuria in the summer of 2021 and was scheduled for cystoscopy, but he cancelled the cysto due to his dysuria abating.  (see note dated 03/13/2020)   He has not had any further dysuria.  His UA today remains clear.    Patient denies any modifying or aggravating factors.  Patient denies any gross hematuria, dysuria or suprapubic/flank pain.  Patient denies any fevers, chills, nausea or vomiting.    IPSS    Row Name 09/24/20 0900         International Prostate Symptom Score   How often have you had the sensation of not emptying your bladder? Less than 1 in 5     How often have you had to urinate less than every two hours? Less than 1 in 5 times     How often have you found you stopped and started again several times when you urinated? Less than half the time     How often have you found it difficult to postpone urination? Less than 1 in 5 times     How often have you had a weak urinary stream? Less than 1 in 5 times     How often have you had to strain to start urination? Not at All     How many times did you typically get up at night to urinate? 1 Time     Total IPSS Score 7           Quality of Life due to urinary symptoms   If you were to spend the rest of your life with your urinary condition just the way it is now how would you feel about that? Pleased            Score:  1-7 Mild 8-19  Moderate 20-35 Severe  Patient still having spontaneous erections.   He denies any pain or curvature with erections.  He does not need a refill at this time.    SHIM    Row Name 09/24/20 0931         SHIM: Over the last 6 months:   How do you rate your confidence that you could get and keep an erection? Moderate     When you had erections with sexual stimulation, how often were your erections hard enough for penetration (entering your partner)? Sometimes (about half the time)     During sexual intercourse, how often were you able to maintain your erection after you had penetrated (entered) your partner? Most Times (much more than half the time)     During sexual intercourse, how difficult was it to maintain your erection to completion of intercourse? Slightly Difficult     When you attempted sexual intercourse, how often was it satisfactory for you? Most Times (much more than half the time)           SHIM Total  Score   SHIM 18            Score: 1-7 Severe ED 8-11 Moderate ED 12-16 Mild-Moderate ED 17-21 Mild ED 22-25 No ED  PMH: Past Medical History:  Diagnosis Date  . AF (atrial fibrillation) ([facility])   . Back pain    lower  . Basal cell carcinoma 11/01/2019   nasal tip, right ear concha  . ED (erectile dysfunction)   . ED (erectile dysfunction)   . Enlarged prostate   . Essential hypertension   . Hypogonadism in male   . Hypothyroidism   . Paroxysmal A-fib ([facility])    a. 07/2018 s/p PVI.    Surgical History: Past Surgical History:  Procedure Laterality Date  . ATRIAL FIBRILLATION ABLATION N/A 07/27/2018   Procedure: ATRIAL FIBRILLATION ABLATION;  Surgeon: Hillis Range, MD;  [facility];  Service: Cardiovascular;  Laterality: N/A;  . CARDIOVERSION N/A 01/13/2018   Procedure: CARDIOVERSION;  Surgeon: Yvonne Kendall, MD;  Location: [facility];  Service: Cardiovascular;  Laterality: N/A;  . CARDIOVERSION N/A 01/26/2018   Procedure: CARDIOVERSION;   Surgeon: Antonieta Iba, MD;  Location: [facility];  Service: Cardiovascular;  Laterality: N/A;  . CARDIOVERSION N/A 03/01/2018   Procedure: CARDIOVERSION;  Surgeon: Antonieta Iba, MD;  Location: [facility];  Service: Cardiovascular;  Laterality: N/A;  . CARDIOVERSION N/A 05/21/2018   Procedure: CARDIOVERSION;  Surgeon: Iran Ouch, MD;  Location: [facility];  Service: Cardiovascular;  Laterality: N/A;  . COLONOSCOPY  04/2005  . COLONOSCOPY WITH PROPOFOL N/A 03/28/2020   Procedure: COLONOSCOPY WITH PROPOFOL;  Surgeon: Earline Mayotte, MD;  Location: [facility];  Service: Endoscopy;  Laterality: N/A;  . TONSILLECTOMY    . 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 If you have any questions, ask your nurse or doctor.        acetaminophen 650 MG CR tablet Commonly known as: TYLENOL Take 650 mg by mouth as needed for pain.   dutasteride 0.5 MG capsule Commonly known as: AVODART Take 1 capsule (0.5 mg total) by mouth daily.   Eliquis 5 MG Tabs tablet Generic drug: apixaban TAKE (1) TABLET BY MOUTH TWICE DAILY   Ipratropium-Albuterol 20-100 MCG/ACT Aers respimat Commonly known as: COMBIVENT Inhale 1 puff into the lungs every 6 (six) hours as needed for wheezing or shortness of breath.   levothyroxine 25 MCG tablet Commonly known as: SYNTHROID Take 1 tablet (25 mcg total) by mouth daily before breakfast.   losartan 100 MG tablet Commonly known as: COZAAR Take 0.5 tablets (50 mg total) by mouth 2 (two) times daily.   tamsulosin 0.4 MG Caps capsule Commonly known as:  FLOMAX Take 1 capsule (0.4 mg total) by mouth daily.   Uribel 118 MG Caps Take 1 capsule (118 mg total) by mouth 2 (two) times daily as needed.       Allergies:  Allergies  Allergen Reactions  . Statins Other (See Comments)    Patient declines any statins  . Ciprofloxacin Rash  Family History: Family History  Problem Relation Age of Onset  . Ovarian cancer Mother   . Throat cancer Father   . Prostate cancer Neg Hx   . Kidney cancer Neg Hx     Social History:  reports that he quit smoking about 12 years ago. His smoking use included cigarettes. He smoked 0.50 packs per day. He has never used smokeless tobacco. He reports current alcohol use. He reports that he does not use drugs.   Physical Exam: BP (!) 181/90   Pulse 71   Temp 98 F (36.7 C)   Ht 6' (1.829 m)   Wt 225 lb (102.1 kg)   BMI 30.52 kg/m   Constitutional:  Well nourished. Alert and oriented, No acute distress. HEENT: [place] AT, mask in place.  Trachea midline Cardiovascular: No clubbing, cyanosis, or edema. Respiratory: Normal respiratory effort, no increased work of breathing. GU: No CVA tenderness.  No bladder fullness or masses.  Patient with uncircumcised phallus.  Foreskin easily retracted Urethral meatus is patent.  No penile discharge. No penile lesions or rashes. Scrotum without lesions, cysts, rashes and/or edema.  Testicles are located scrotally bilaterally. No masses are appreciated in the testicles. Left and right epididymis are normal. Rectal: Patient with  normal sphincter tone. Anus and perineum without scarring or rashes. No rectal masses are appreciated. Prostate is approximately 40 grams, no nodules are appreciated. Seminal vesicles could not be palpated. Lymph: No inguinal adenopathy. Neurologic: Grossly intact, no focal deficits, moving all 4 extremities. Psychiatric: Normal mood and affect.  Laboratory Data: Component     Latest Ref Rng & Units 09/22/2016 09/22/2017 09/20/2018 09/21/2019   Prostate Specific Ag, Serum     0.0 - 4.0 ng/mL 0.4 0.3 0.2 0.2   Component     Latest Ref Rng & Units 09/18/2020  Prostatic Specific Antigen     0.00 - 4.00 ng/mL 0.12   Component     Latest Ref Rng & Units 03/13/2020  Ureaplasma urealyticum     Negative Negative  Mycoplasma hominis Culture     Negative Negative   Urinalysis Component     Latest Ref Rng & Units 09/24/2020  Color, Urine     YELLOW YELLOW  Appearance     CLEAR CLEAR  Specific Gravity, Urine     1.005 - 1.030 1.015  pH     5.0 - 8.0 7.5  Glucose, UA     NEGATIVE mg/dL NEGATIVE  Hgb urine dipstick     NEGATIVE NEGATIVE  Bilirubin Urine     NEGATIVE NEGATIVE  Ketones, ur     NEGATIVE mg/dL NEGATIVE  Protein     NEGATIVE mg/dL NEGATIVE  Nitrite     NEGATIVE NEGATIVE  Leukocytes,Ua     NEGATIVE NEGATIVE  WBC, UA     0 - 5 WBC/hpf   Bacteria, UA     NONE SEEN   Squamous Epithelial / LPF     0 - 5   Specific Gravity, UA     1.005 - 1.030   pH, UA     5.0 - 7.5   Color, UA     Yellow   Appearance Ur     Clear   Leukocytes,UA     Negative   Protein,UA     Negative/Trace   Ketones, UA     Negative   RBC, UA     Negative   Bilirubin, UA     Negative   Urobilinogen, Ur  0.2 - 1.0 mg/dL   Nitrite, UA     Negative   Microscopic Examination         I have reviewed the labs.  Pertinent Imaging: No recent imaging   Assessment & Plan:    1. BPH with LUTS IPSS score is 7/1 Continue conservative management, avoiding bladder irritants and timed voiding's Continue tamsulosin 0.4 mg daily and dutasteride 0.5 mg daily - refills given RTC in 12 months for IPSS, PSA and exam   2. Erectile dysfunction SHIM score is 18 Continue Cialis - does not need refill at this time RTC in 12 months for repeat SHIM score and exam    [place] [address], [place], [place] [address] [phone]

## 2020-09-24 ENCOUNTER — Ambulatory Visit: Payer: PPO | Admitting: Urology

## 2020-09-24 ENCOUNTER — Other Ambulatory Visit: Payer: Self-pay

## 2020-09-24 ENCOUNTER — Other Ambulatory Visit
Admission: RE | Admit: 2020-09-24 | Discharge: 2020-09-24 | Disposition: A | Discharge status: Home / Self Care | Payer: PPO | Attending: Urology | Admitting: Urology

## 2020-09-24 ENCOUNTER — Encounter: Payer: Self-pay | Admitting: Urology

## 2020-09-24 VITALS — BP 181/90 | HR 71 | Temp 98.0°F | Ht 72.0 in | Wt 225.0 lb

## 2020-09-24 DIAGNOSIS — N529 Male erectile dysfunction, unspecified: Secondary | ICD-10-CM | POA: Diagnosis not present

## 2020-09-24 DIAGNOSIS — R300 Dysuria: Secondary | ICD-10-CM

## 2020-09-24 DIAGNOSIS — N138 Other obstructive and reflux uropathy: Secondary | ICD-10-CM

## 2020-09-24 DIAGNOSIS — N401 Enlarged prostate with lower urinary tract symptoms: Secondary | ICD-10-CM

## 2020-09-24 LAB — URINALYSIS, ROUTINE W REFLEX MICROSCOPIC
Bilirubin Urine: NEGATIVE
Glucose, UA: NEGATIVE mg/dL
Hgb urine dipstick: NEGATIVE
Ketones, ur: NEGATIVE mg/dL
Leukocytes,Ua: NEGATIVE
Nitrite: NEGATIVE
Protein, ur: NEGATIVE mg/dL
Specific Gravity, Urine: 1.015 (ref 1.005–1.030)
pH: 7.5 (ref 5.0–8.0)

## 2020-09-24 MED ORDER — TAMSULOSIN HCL 0.4 MG PO CAPS: 0.4000 mg | ORAL_CAPSULE | Freq: Every day | ORAL | 3 refills | Status: DC

## 2020-09-24 MED ORDER — DUTASTERIDE 0.5 MG PO CAPS: 0.5000 mg | ORAL_CAPSULE | Freq: Every day | ORAL | 3 refills | Status: DC

## 2020-11-15 ENCOUNTER — Other Ambulatory Visit (HOSPITAL_COMMUNITY): Payer: Self-pay | Admitting: Nurse Practitioner

## 2020-11-19 ENCOUNTER — Other Ambulatory Visit (HOSPITAL_COMMUNITY): Payer: Self-pay | Admitting: Nurse Practitioner

## 2021-01-07 ENCOUNTER — Ambulatory Visit: Payer: PPO | Admitting: Internal Medicine

## 2021-01-07 ENCOUNTER — Encounter: Payer: Self-pay | Admitting: Internal Medicine

## 2021-01-07 ENCOUNTER — Other Ambulatory Visit: Payer: Self-pay

## 2021-01-07 VITALS — BP 158/68 | HR 68 | Ht 72.0 in | Wt 229.6 lb

## 2021-01-07 DIAGNOSIS — I10 Essential (primary) hypertension: Secondary | ICD-10-CM

## 2021-01-07 DIAGNOSIS — D6869 Other thrombophilia: Secondary | ICD-10-CM

## 2021-01-07 DIAGNOSIS — G4733 Obstructive sleep apnea (adult) (pediatric): Secondary | ICD-10-CM

## 2021-01-07 DIAGNOSIS — I4819 Other persistent atrial fibrillation: Secondary | ICD-10-CM

## 2021-01-07 MED ORDER — LOSARTAN POTASSIUM 50 MG PO TABS
50.0000 mg | ORAL_TABLET | Freq: Every day | ORAL | 3 refills | Status: DC
Start: 2021-01-07 — End: 2021-10-24

## 2021-01-07 NOTE — Patient Instructions (Addendum)
Medication Instructions:  Restart your Losartan at 50 mg daily  Your physician recommends that you continue on your current medications as directed. Please refer to the Current Medication list given to you today.  Labwork: None ordered.  Testing/Procedures: None ordered.  Follow-Up: Your physician wants you to follow-up in: 6 months with the [facility]. They will contact you to schedule.  01/13/22 at 10 am with Dr. Rayann Heman.   Any Other Special Instructions Will Be Listed Below (If Applicable).  If you need a refill on your cardiac medications before your next appointment, please call your pharmacy.

## 2021-01-07 NOTE — Progress Notes (Signed)
PCP: Valera Castle, MD   Primary EP: Dr Camille Bal is a 70 y.o. male who presents today for routine electrophysiology followup.  Since last being seen in our clinic, the patient reports doing very well.  Today, he denies symptoms of palpitations, chest pain, shortness of breath,  lower extremity edema, dizziness, presyncope, or syncope.  The patient is otherwise without complaint today.   Past Medical History:  Diagnosis Date  . AF (atrial fibrillation) ([place])   . Back pain    lower  . Basal cell carcinoma 11/01/2019   nasal tip, right ear concha  . ED (erectile dysfunction)   . ED (erectile dysfunction)   . Enlarged prostate   . Essential hypertension   . Hypogonadism in male   . Hypothyroidism   . Paroxysmal A-fib ([place])    a. 07/2018 s/p PVI.   Past Surgical History:  Procedure Laterality Date  . ATRIAL FIBRILLATION ABLATION N/A 07/27/2018   Procedure: ATRIAL FIBRILLATION ABLATION;  Surgeon: Thompson Grayer, MD;  Location: [facility];  Service: Cardiovascular;  Laterality: N/A;  . CARDIOVERSION N/A 01/13/2018   Procedure: CARDIOVERSION;  Surgeon: Nelva Bush, MD;  Location: [facility];  Service: Cardiovascular;  Laterality: N/A;  . CARDIOVERSION N/A 01/26/2018   Procedure: CARDIOVERSION;  Surgeon: Minna Merritts, MD;  Location: [facility];  Service: Cardiovascular;  Laterality: N/A;  . CARDIOVERSION N/A 03/01/2018   Procedure: CARDIOVERSION;  Surgeon: Minna Merritts, MD;  Location: [facility];  Service: Cardiovascular;  Laterality: N/A;  . CARDIOVERSION N/A 05/21/2018   Procedure: CARDIOVERSION;  Surgeon: Wellington Hampshire, MD;  Location: [facility];  Service: Cardiovascular;  Laterality: N/A;  . COLONOSCOPY  04/2005  . COLONOSCOPY WITH PROPOFOL N/A 03/28/2020   Procedure: COLONOSCOPY WITH PROPOFOL;  Surgeon: Robert Bellow, MD;  Location: [facility];  Service: Endoscopy;  Laterality: N/A;  . TONSILLECTOMY    . 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 acetaminophen (TYLENOL) 650 MG CR tablet Take 650 mg by mouth as needed for pain.     Marland Kitchen dutasteride (AVODART) 0.5 MG capsule Take 1 capsule (0.5 mg total) by mouth daily. 90 capsule 3  . ELIQUIS 5 MG TABS tablet TAKE (1) TABLET BY MOUTH TWICE DAILY 180 tablet 1  . Ipratropium-Albuterol (COMBIVENT) 20-100 MCG/ACT AERS respimat Inhale 1 puff into the lungs every 6 (six) hours as needed for wheezing or shortness of breath.     . levothyroxine (SYNTHROID, LEVOTHROID) 25 MCG tablet Take 1 tablet (25 mcg total) by mouth daily before breakfast. 30 tablet 1  . losartan (COZAAR) 100 MG tablet Take 0.5 tablets (50 mg total) by mouth 2 (two) times daily. 60 tablet 6  . Meth-Hyo-M Bl-Na Phos-Ph Sal (URIBEL) 118 MG CAPS Take 1 capsule (118 mg total) by mouth 2 (two) times daily as needed. 6 capsule 0  . tamsulosin (FLOMAX) 0.4 MG CAPS capsule Take 1 capsule (0.4 mg total) by mouth daily. 90 capsule 3   No current facility-administered medications for this visit.    Physical Exam: Vitals:   01/07/21 1032  BP: (!) 158/68  Pulse: 68  SpO2: 96%  Weight: 229 lb 9.6  oz (104.1 kg)  Height: 6' (1.829 m)    GEN- The patient is well appearing, alert and oriented x 3 today.   Head- normocephalic, atraumatic Eyes-  Sclera clear, conjunctiva pink Ears- hearing intact Oropharynx- clear Lungs- Clear to ausculation bilaterally, normal work of breathing Heart- Regular rate and rhythm, no murmurs, rubs or gallops, PMI not laterally displaced GI- soft, NT, ND, + BS Extremities- no clubbing, cyanosis, or edema  Wt Readings from Last 3 Encounters:  01/07/21 229 lb 9.6 oz (104.1  kg)  09/24/20 225 lb (102.1 kg)  07/04/20 225 lb 12.8 oz (102.4 kg)    EKG tracing ordered today is personally reviewed and shows sinus rhythm  Assessment and Plan:  1. Persistent atrial fibrillation He is doing great post ablation off AAD therapy chads2vasc score is 2.  He is on eliquis  2. HTN Elevated He has been off of his losartan (ran out) for 2 weeks.  Also just returned from [place] and has been eating seafood.  We discussed at length today.  3. Post termination pauses Resolved  4. OSA Mild Uses a dental appliance from Dr Ron Parker  AF clinic in 6 months I will see in a year  Thompson Grayer MD, [facility] 01/07/2021 10:33 AM

## 2021-01-21 ENCOUNTER — Other Ambulatory Visit: Payer: Self-pay | Admitting: Internal Medicine

## 2021-01-21 ENCOUNTER — Other Ambulatory Visit (HOSPITAL_COMMUNITY): Payer: Self-pay | Admitting: Nurse Practitioner

## 2021-01-28 ENCOUNTER — Telehealth (HOSPITAL_COMMUNITY): Payer: Self-pay | Admitting: *Deleted

## 2021-01-28 MED ORDER — METOPROLOL TARTRATE 25 MG PO TABS
12.5000 mg | ORAL_TABLET | Freq: Two times a day (BID) | ORAL | 3 refills | Status: DC
Start: 2021-01-28 — End: 2022-02-13

## 2021-01-28 NOTE — Addendum Note (Signed)
Addended by: Darrell Jewel on: 01/28/2021 05:34 PM   Modules accepted: Orders

## 2021-01-28 NOTE — Telephone Encounter (Signed)
Spoke to the patient and sent in prescription to pharmacy requested.

## 2021-01-28 NOTE — Telephone Encounter (Signed)
Ok to refill 

## 2021-01-28 NOTE — Telephone Encounter (Signed)
Pt wife, Olegario Shearer, called to clinic stating pt needed a refill on his metoprolol tartrate 12.5mg  BID. Reviewed medication list and it was discontinued by Dr. Rayann Heman at office visit in October 2021. When questioned pt wife stated yes it was stopped but pt had several episodes of Af therefore the pt restarted it on his own but there are no refills left.  Per Adline Peals, PA since pt recently seen by Dr. Rayann Heman in April would defer to him on decision to continue metoprolol or not and to authorize refills. Wife can be reached at [phone]

## 2021-01-31 ENCOUNTER — Other Ambulatory Visit: Payer: Self-pay | Admitting: Internal Medicine

## 2021-01-31 NOTE — Telephone Encounter (Signed)
Eliquis 5mg  refill request received. Patient is 70 years old, weight-104.1kg, Crea-0.70 on 02/10/2020, Diagnosis-Afib, and last seen by Dr. Rayann Heman on 01/07/21. Dose is appropriate based on dosing criteria. Will send in refill to requested pharmacy.

## 2021-03-26 DIAGNOSIS — M4316 Spondylolisthesis, lumbar region: Secondary | ICD-10-CM | POA: Diagnosis not present

## 2021-03-27 ENCOUNTER — Other Ambulatory Visit (HOSPITAL_COMMUNITY): Payer: Self-pay | Admitting: Student

## 2021-03-27 ENCOUNTER — Other Ambulatory Visit: Payer: Self-pay | Admitting: Student

## 2021-03-27 DIAGNOSIS — M4316 Spondylolisthesis, lumbar region: Secondary | ICD-10-CM

## 2021-04-03 ENCOUNTER — Ambulatory Visit
Admission: RE | Admit: 2021-04-03 | Discharge: 2021-04-03 | Disposition: A | Discharge status: Home / Self Care | Payer: PPO | Source: Ambulatory Visit | Attending: Student | Admitting: Student

## 2021-04-03 ENCOUNTER — Other Ambulatory Visit: Payer: Self-pay

## 2021-04-03 DIAGNOSIS — M545 Low back pain, unspecified: Secondary | ICD-10-CM | POA: Diagnosis not present

## 2021-04-03 DIAGNOSIS — M4316 Spondylolisthesis, lumbar region: Secondary | ICD-10-CM | POA: Diagnosis not present

## 2021-04-09 DIAGNOSIS — M4807 Spinal stenosis, lumbosacral region: Secondary | ICD-10-CM | POA: Diagnosis not present

## 2021-04-09 DIAGNOSIS — M48062 Spinal stenosis, lumbar region with neurogenic claudication: Secondary | ICD-10-CM | POA: Diagnosis not present

## 2021-04-09 DIAGNOSIS — M4316 Spondylolisthesis, lumbar region: Secondary | ICD-10-CM | POA: Diagnosis not present

## 2021-04-12 DIAGNOSIS — M65861 Other synovitis and tenosynovitis, right lower leg: Secondary | ICD-10-CM | POA: Diagnosis not present

## 2021-04-17 DIAGNOSIS — M65861 Other synovitis and tenosynovitis, right lower leg: Secondary | ICD-10-CM | POA: Diagnosis not present

## 2021-04-26 DIAGNOSIS — M23004 Cystic meniscus, unspecified medial meniscus, left knee: Secondary | ICD-10-CM | POA: Diagnosis not present

## 2021-04-26 DIAGNOSIS — S83281A Other tear of lateral meniscus, current injury, right knee, initial encounter: Secondary | ICD-10-CM | POA: Diagnosis not present

## 2021-04-30 ENCOUNTER — Telehealth: Payer: Self-pay | Admitting: *Deleted

## 2021-04-30 DIAGNOSIS — Z01818 Encounter for other preprocedural examination: Secondary | ICD-10-CM

## 2021-04-30 NOTE — Telephone Encounter (Signed)
   [facility] Pre-operative Risk Assessment    Patient Name: Alexander Carey  DOB: [birth year] MRN: [id number]  HEARTCARE STAFF:  - IMPORTANT!!!!!! Under Visit Info/Reason for Call, type in Other and utilize the format Clearance MM/DD/YY or Clearance TBD. Do not use dashes or single digits. - Please review there is not already an duplicate clearance open for this procedure. - If request is for dental extraction, please clarify the # of teeth to be extracted. - If the patient is currently at the dentist's office, call Pre-Op Callback Staff (MA/nurse) to input urgent request.  - If the patient is not currently in the dentist office, please route to the Pre-Op pool.  Request for surgical clearance:  What type of surgery is being performed? Rt Knee Arthroscopy  When is this surgery scheduled? 05/30/2021  What type of clearance is required (medical clearance vs. Pharmacy clearance to hold med vs. Both)? Both  Are there any medications that need to be held prior to surgery and how long? Eliquis  Practice name and name of physician performing surgery? [facility]; Dr Mack Guise  What is the office phone number? [phone]   7.   What is the office fax number? [phone]  8.   Anesthesia type (None, local, MAC, general) ? Not listed   Juventino Slovak 04/30/2021, 1:09 PM  _________________________________________________________________   (provider comments below)

## 2021-05-02 NOTE — Telephone Encounter (Signed)
   Name: Alexander Carey  DOB: 12-01-1950  MRN: [id number]   Primary Cardiologist: Virl Axe, MD  Chart reviewed as part of pre-operative protocol coverage. Patient was contacted 05/02/2021 in reference to pre-operative risk assessment for pending surgery as outlined below.  Alexander Carey was last seen on 01/07/21 by Dr. Rayann Heman.  Since that day, Alexander Carey has done well. He can complete more than 4.0 METS without angina.   Therefore, based on ACC/AHA guidelines, the patient would be at acceptable risk for the planned procedure without further cardiovascular testing.   The patient was advised that if he develops new symptoms prior to surgery to contact our office to arrange for a follow-up visit, and he verbalized understanding.   Per our clinical pharmacist, patient will need labs prior to final recommendations for eliquis hold. Pt lives in [place] and can travel to [place] for labs.     Tami Lin Itzel Mckibbin, PA 05/02/2021, 1:59 PM

## 2021-05-02 NOTE — Telephone Encounter (Signed)
Left VM

## 2021-05-02 NOTE — Telephone Encounter (Signed)
Patient with diagnosis of A Fib on Eliquis for anticoagulation.    Procedure: Rt Knee Arthroscopy Date of procedure: 05/30/2021   CHA2DS2-VASc Score = 2  This indicates a 2.2% annual risk of stroke. The patient's score is based upon: CHF History: No HTN History: Yes Diabetes History: No Stroke History: No Vascular Disease History: No Age Score: 1 Gender Score: 0    Patient has not had lab work in over a year.  Need updated labs before patient can be cleared

## 2021-05-02 NOTE — Telephone Encounter (Signed)
Called the patient and left a detailed message for why I was calling. Asked that when he calls back to inform the person that he is calling to make an appointment for blood work to be done at the [place] office. Will route to the scheduling pool.  Placed order for BMET and CBC. Order will need to be released

## 2021-05-06 NOTE — Telephone Encounter (Signed)
I s/w the pt this morning in regards to lab work needed for pre op clearance. Pt is agreeable to lab work, BMET, CBC which will be done in the [place] office. Pt is grateful for the call and the help. He did ask where does he exactly go for the lab work in [place] office. I assured the pt that I will have the [facility] office call him to let him know exactly where he will need to go for the lab work. Pt again said thank you. Pt said he will go tomorrow 05/07/21 for the lab work.

## 2021-05-06 NOTE — Telephone Encounter (Signed)
Spoke with pt. Notified we do not schedule labs in our office. Pt will have labs at [facility] medical mall tomorrow 05/07/21.  Pt voiced understanding of instructions regarding this location: -  Please go to the [facility] will check in at the front desk to the right as you walk into the atrium.  -  Valet Parking is offered if needed. -  No appointment needed. You may go any day between 7 am and 6 pm.  BMET,CBC lab orders updated for [facility]. Pt appreciative of assistance and has no further questions.

## 2021-05-07 ENCOUNTER — Other Ambulatory Visit
Admission: RE | Admit: 2021-05-07 | Discharge: 2021-05-07 | Disposition: A | Discharge status: Home / Self Care | Payer: PPO | Attending: Physician Assistant | Admitting: Physician Assistant

## 2021-05-07 DIAGNOSIS — Z01818 Encounter for other preprocedural examination: Secondary | ICD-10-CM | POA: Insufficient documentation

## 2021-05-07 LAB — CBC
HCT: 39.3 % (ref 39.0–52.0)
Hemoglobin: 14.1 g/dL (ref 13.0–17.0)
MCH: 37.1 pg — ABNORMAL HIGH (ref 26.0–34.0)
MCHC: 35.9 g/dL (ref 30.0–36.0)
MCV: 103.4 fL — ABNORMAL HIGH (ref 80.0–100.0)
Platelets: 179 10*3/uL (ref 150–400)
RBC: 3.8 MIL/uL — ABNORMAL LOW (ref 4.22–5.81)
RDW: 12.5 % (ref 11.5–15.5)
WBC: 5 10*3/uL (ref 4.0–10.5)
nRBC: 0 % (ref 0.0–0.2)

## 2021-05-07 LAB — BASIC METABOLIC PANEL
Anion gap: 10 (ref 5–15)
BUN: 12 mg/dL (ref 8–23)
CO2: 25 mmol/L (ref 22–32)
Calcium: 8.9 mg/dL (ref 8.9–10.3)
Chloride: 99 mmol/L (ref 98–111)
Creatinine, Ser: 0.73 mg/dL (ref 0.61–1.24)
GFR, Estimated: >60 mL/min (ref >60)
Glucose, Bld: 98 mg/dL (ref 70–99)
Potassium: 4.4 mmol/L (ref 3.5–5.1)
Sodium: 134 mmol/L — ABNORMAL LOW (ref 135–145)

## 2021-05-08 NOTE — Telephone Encounter (Signed)
Labs resulted:  CrCl - 116 (with adjusted body weight) Platelets - 179  CHADS2-VASc score of 2 (htn, age)  Faythe Ghee to hold Eliquis for 3 days prior to procedure  Please resume therapeutic (not prophylactic) dose of Eliquis after procedure.

## 2021-05-08 NOTE — Telephone Encounter (Signed)
    Patient Name: Alexander Carey  DOB: 1950/09/20 MRN: [id number]  Primary Cardiologist: Virl Axe, MD  Chart reviewed as part of pre-operative protocol coverage. Given past medical history and time since last visit, based on ACC/AHA guidelines, Alexander Carey would be at acceptable risk for the planned procedure without further cardiovascular testing.   Patient was contacted 05/02/2021 in reference to pre-operative risk assessment for pending surgery as outlined below.  Alexander Carey was last seen on 01/07/21 by Dr. Rayann Heman.  Since that day, Alexander Carey has done well. He can complete more than 4.0 METS without angina.    Therefore, based on ACC/AHA guidelines, the patient would be at acceptable risk for the planned procedure without further cardiovascular testing.   Labs resulted:   CrCl - 116 (with adjusted body weight) Platelets - 179   CHADS2-VASc score of 2 (htn, age)   Alexander Carey to hold Eliquis for 3 days prior to procedure   Please resume therapeutic (not prophylactic) dose of Eliquis after procedure.    The patient was advised that if he develops new symptoms prior to surgery to contact our office to arrange for a follow-up visit, and he verbalized understanding.   I will route this recommendation to the requesting party via Epic fax function and remove from pre-op pool.  Please call with questions.  Kathyrn Drown, NP 05/08/2021, 11:06 AM

## 2021-05-15 ENCOUNTER — Other Ambulatory Visit: Payer: Self-pay | Admitting: Orthopedic Surgery

## 2021-05-21 ENCOUNTER — Encounter: Payer: Self-pay | Admitting: Orthopedic Surgery

## 2021-05-30 ENCOUNTER — Ambulatory Visit
Admission: RE | Admit: 2021-05-30 | Discharge: 2021-05-30 | Disposition: A | Discharge status: Home / Self Care | Payer: PPO | Attending: Orthopedic Surgery | Admitting: Orthopedic Surgery

## 2021-05-30 ENCOUNTER — Encounter
Admission: RE | Disposition: A | Discharge status: Home / Self Care | Payer: Self-pay | Source: Home / Self Care | Attending: Orthopedic Surgery

## 2021-05-30 ENCOUNTER — Ambulatory Visit: Payer: PPO | Admitting: Anesthesiology

## 2021-05-30 ENCOUNTER — Other Ambulatory Visit: Payer: Self-pay

## 2021-05-30 ENCOUNTER — Encounter: Payer: Self-pay | Admitting: Orthopedic Surgery

## 2021-05-30 DIAGNOSIS — Z7901 Long term (current) use of anticoagulants: Secondary | ICD-10-CM | POA: Insufficient documentation

## 2021-05-30 DIAGNOSIS — S83231A Complex tear of medial meniscus, current injury, right knee, initial encounter: Secondary | ICD-10-CM | POA: Insufficient documentation

## 2021-05-30 DIAGNOSIS — Z888 Allergy status to other drugs, medicaments and biological substances status: Secondary | ICD-10-CM | POA: Diagnosis not present

## 2021-05-30 DIAGNOSIS — Z87891 Personal history of nicotine dependence: Secondary | ICD-10-CM | POA: Insufficient documentation

## 2021-05-30 DIAGNOSIS — X58XXXA Exposure to other specified factors, initial encounter: Secondary | ICD-10-CM | POA: Insufficient documentation

## 2021-05-30 DIAGNOSIS — M2241 Chondromalacia patellae, right knee: Secondary | ICD-10-CM | POA: Insufficient documentation

## 2021-05-30 DIAGNOSIS — S83241A Other tear of medial meniscus, current injury, right knee, initial encounter: Secondary | ICD-10-CM | POA: Diagnosis not present

## 2021-05-30 DIAGNOSIS — S83271A Complex tear of lateral meniscus, current injury, right knee, initial encounter: Secondary | ICD-10-CM | POA: Insufficient documentation

## 2021-05-30 DIAGNOSIS — Z79890 Hormone replacement therapy (postmenopausal): Secondary | ICD-10-CM | POA: Insufficient documentation

## 2021-05-30 DIAGNOSIS — Z79899 Other long term (current) drug therapy: Secondary | ICD-10-CM | POA: Insufficient documentation

## 2021-05-30 DIAGNOSIS — S83281A Other tear of lateral meniscus, current injury, right knee, initial encounter: Secondary | ICD-10-CM | POA: Diagnosis not present

## 2021-05-30 DIAGNOSIS — Z881 Allergy status to other antibiotic agents status: Secondary | ICD-10-CM | POA: Diagnosis not present

## 2021-05-30 HISTORY — DX: Unspecified hearing loss, unspecified ear: H91.90

## 2021-05-30 HISTORY — PX: KNEE ARTHROSCOPY WITH MEDIAL MENISECTOMY: SHX5651

## 2021-05-30 SURGERY — ARTHROSCOPY, KNEE, WITH MEDIAL MENISCECTOMY
Anesthesia: General | Site: Knee | Laterality: Right

## 2021-05-30 MED ORDER — DEXAMETHASONE SODIUM PHOSPHATE 4 MG/ML IJ SOLN
INTRAMUSCULAR | Status: DC | PRN
  Administered 2021-05-30: 4 mg via INTRAVENOUS

## 2021-05-30 MED ORDER — FENTANYL CITRATE (PF) 100 MCG/2ML IJ SOLN
INTRAMUSCULAR | Status: DC | PRN
  Administered 2021-05-30: 25 ug via INTRAVENOUS
  Administered 2021-05-30: 50 ug via INTRAVENOUS
  Administered 2021-05-30: 25 ug via INTRAVENOUS

## 2021-05-30 MED ORDER — ACETAMINOPHEN 160 MG/5ML PO SOLN: 325.0000 mg | ORAL | Status: DC | PRN

## 2021-05-30 MED ORDER — CHLORHEXIDINE GLUCONATE CLOTH 2 % EX PADS: 6.0000 | MEDICATED_PAD | Freq: Once | CUTANEOUS | Status: DC

## 2021-05-30 MED ORDER — MIDAZOLAM HCL 5 MG/5ML IJ SOLN
INTRAMUSCULAR | Status: DC | PRN
  Administered 2021-05-30: 2 mg via INTRAVENOUS

## 2021-05-30 MED ORDER — DOCUSATE SODIUM 100 MG PO CAPS: 100.0000 mg | ORAL_CAPSULE | Freq: Two times a day (BID) | ORAL | 2 refills | Status: DC

## 2021-05-30 MED ORDER — OXYCODONE HCL 5 MG/5ML PO SOLN: 5.0000 mg | Freq: Once | ORAL | Status: DC | PRN

## 2021-05-30 MED ORDER — HYDROCODONE-ACETAMINOPHEN 5-325 MG PO TABS: 1.0000 | ORAL_TABLET | ORAL | 0 refills | Status: DC | PRN

## 2021-05-30 MED ORDER — ONDANSETRON HCL 4 MG/2ML IJ SOLN
INTRAMUSCULAR | Status: DC | PRN
  Administered 2021-05-30: 4 mg via INTRAVENOUS

## 2021-05-30 MED ORDER — LACTATED RINGERS IR SOLN
Status: DC | PRN
  Administered 2021-05-30: 15000 mL

## 2021-05-30 MED ORDER — LIDOCAINE-EPINEPHRINE 1 %-1:100000 IJ SOLN
INTRAMUSCULAR | Status: DC | PRN
  Administered 2021-05-30: 10 mL

## 2021-05-30 MED ORDER — BUPIVACAINE HCL 0.25 % IJ SOLN
INTRAMUSCULAR | Status: DC | PRN
  Administered 2021-05-30: 20 mL

## 2021-05-30 MED ORDER — ONDANSETRON HCL 4 MG PO TABS: 4.0000 mg | ORAL_TABLET | Freq: Three times a day (TID) | ORAL | 0 refills | Status: DC | PRN

## 2021-05-30 MED ORDER — CEFAZOLIN SODIUM-DEXTROSE 2-4 GM/100ML-% IV SOLN
2.0000 g | INTRAVENOUS | Status: AC
  Administered 2021-05-30: 2 g via INTRAVENOUS

## 2021-05-30 MED ORDER — LACTATED RINGERS IV SOLN: INTRAVENOUS | Status: DC

## 2021-05-30 MED ORDER — ACETAMINOPHEN 500 MG PO TABS
1000.0000 mg | ORAL_TABLET | ORAL | Status: AC
  Administered 2021-05-30: 1000 mg via ORAL

## 2021-05-30 MED ORDER — PROPOFOL 10 MG/ML IV BOLUS
INTRAVENOUS | Status: DC | PRN
Start: 2021-05-30 — End: 2021-05-30
  Administered 2021-05-30 (×2): 100 mg via INTRAVENOUS

## 2021-05-30 MED ORDER — OXYCODONE HCL 5 MG PO TABS: 5.0000 mg | ORAL_TABLET | Freq: Once | ORAL | Status: DC | PRN

## 2021-05-30 MED ORDER — LIDOCAINE HCL (CARDIAC) PF 100 MG/5ML IV SOSY
PREFILLED_SYRINGE | INTRAVENOUS | Status: DC | PRN
  Administered 2021-05-30: 40 mg via INTRATRACHEAL

## 2021-05-30 MED ORDER — FENTANYL CITRATE PF 50 MCG/ML IJ SOSY: 25.0000 ug | PREFILLED_SYRINGE | INTRAMUSCULAR | Status: DC | PRN

## 2021-05-30 MED ORDER — LIDOCAINE HCL 1 % IJ SOLN
INTRAMUSCULAR | Status: DC | PRN
  Administered 2021-05-30: 5 mL

## 2021-05-30 MED ORDER — GLYCOPYRROLATE 0.2 MG/ML IJ SOLN
INTRAMUSCULAR | Status: DC | PRN
  Administered 2021-05-30 (×2): .1 mg via INTRAVENOUS

## 2021-05-30 MED ORDER — PHENYLEPHRINE HCL (PRESSORS) 10 MG/ML IV SOLN
INTRAVENOUS | Status: DC | PRN
  Administered 2021-05-30: 200 ug via INTRAVENOUS
  Administered 2021-05-30 (×2): 100 ug via INTRAVENOUS
  Administered 2021-05-30 (×2): 200 ug via INTRAVENOUS
  Administered 2021-05-30 (×2): 100 ug via INTRAVENOUS
  Administered 2021-05-30: 200 ug via INTRAVENOUS

## 2021-05-30 MED ORDER — ONDANSETRON HCL 4 MG/2ML IJ SOLN: 4.0000 mg | Freq: Once | INTRAMUSCULAR | Status: DC | PRN

## 2021-05-30 MED ORDER — ACETAMINOPHEN 325 MG PO TABS: 325.0000 mg | ORAL_TABLET | ORAL | Status: DC | PRN

## 2021-05-30 SURGICAL SUPPLY — 38 items
BLADE FULL RADIUS 3.5 (BLADE) ×2 IMPLANT
BLADE SHAVER 4.5X7 STR FR (MISCELLANEOUS) IMPLANT
BNDG CMPR STD VLCR NS LF 5.8X6 (GAUZE/BANDAGES/DRESSINGS) ×1
BNDG ELASTIC 6X5.8 VLCR NS LF (GAUZE/BANDAGES/DRESSINGS) ×2 IMPLANT
BNDG ESMARK 6X12 TAN STRL LF (GAUZE/BANDAGES/DRESSINGS) ×2 IMPLANT
BUR BR 5.5 WIDE MOUTH (BURR) IMPLANT
COOLER POLAR GLACIER W/PUMP (MISCELLANEOUS) ×2 IMPLANT
COVER LIGHT HANDLE FLEXIBLE (MISCELLANEOUS) ×2 IMPLANT
CUFF TOURN SGL QUICK 30 (TOURNIQUET CUFF)
CUFF TRNQT CYL 30X4X21-28X (TOURNIQUET CUFF) IMPLANT
DRAPE IMP U-DRAPE 54X76 (DRAPES) ×2 IMPLANT
DURAPREP 26ML APPLICATOR (WOUND CARE) ×2 IMPLANT
DYONICS 4.0MM FLYER BLADE ×2 IMPLANT
GAUZE SPONGE 4X4 12PLY STRL (GAUZE/BANDAGES/DRESSINGS) ×2 IMPLANT
GLOVE BIOGEL PI IND STRL 9 (GLOVE) ×1 IMPLANT
GLOVE BIOGEL PI INDICATOR 9 (GLOVE) ×1
GLOVE SURG 9.0 ORTHO LTXF (GLOVE) ×4 IMPLANT
GOWN STRL REUS W/ TWL LRG LVL3 (GOWN DISPOSABLE) ×1 IMPLANT
GOWN STRL REUS W/TWL 2XL LVL3 (GOWN DISPOSABLE) ×2 IMPLANT
GOWN STRL REUS W/TWL LRG LVL3 (GOWN DISPOSABLE) ×2
IV LACTATED RINGER IRRG 3000ML (IV SOLUTION) ×10
IV LR IRRIG 3000ML ARTHROMATIC (IV SOLUTION) ×5 IMPLANT
KIT TURNOVER KIT A (KITS) ×2 IMPLANT
MANIFOLD NEPTUNE II (INSTRUMENTS) ×2 IMPLANT
MAT ABSORB  FLUID 56X50 GRAY (MISCELLANEOUS) ×1
MAT ABSORB FLUID 56X50 GRAY (MISCELLANEOUS) ×1 IMPLANT
PACK ARTHROSCOPY KNEE (MISCELLANEOUS) ×2 IMPLANT
PAD ABD DERMACEA PRESS 5X9 (GAUZE/BANDAGES/DRESSINGS) ×4 IMPLANT
PAD WRAPON POLAR KNEE (MISCELLANEOUS) ×1 IMPLANT
SPONGE T-LAP 18X18 ~~LOC~~+RFID (SPONGE) IMPLANT
STRIP CLOSURE SKIN 1/2X4 (GAUZE/BANDAGES/DRESSINGS) ×2 IMPLANT
SUT ETHILON 4-0 (SUTURE) ×2
SUT ETHILON 4-0 FS2 18XMFL BLK (SUTURE) ×1
SUTURE ETHLN 4-0 FS2 18XMF BLK (SUTURE) ×1 IMPLANT
TUBING INFLOW SET DBFLO PUMP (TUBING) ×2 IMPLANT
TUBING OUTFLOW SET DBLFO PUMP (TUBING) ×2 IMPLANT
WAND WEREWOLF FLOW 90D (MISCELLANEOUS) ×2 IMPLANT
WRAPON POLAR PAD KNEE (MISCELLANEOUS) ×2

## 2021-05-30 NOTE — Anesthesia Postprocedure Evaluation (Signed)
Anesthesia Post Note  Patient: Alexander Carey  Procedure(s) Performed: KNEE ARTHROSCOPY WITH MEDIAL AND LATERAL MENISECTOMIES AND CHONDROPLASTY OF THE PATELLA (Right: Knee)     Patient location during evaluation: PACU Anesthesia Type: General Level of consciousness: awake and alert and oriented Pain management: satisfactory to patient Vital Signs Assessment: post-procedure vital signs reviewed and stable Respiratory status: spontaneous breathing, nonlabored ventilation and respiratory function stable Cardiovascular status: blood pressure returned to baseline and stable Postop Assessment: Adequate PO intake and No signs of nausea or vomiting Anesthetic complications: no   No notable events documented.  Raliegh Ip

## 2021-05-30 NOTE — Anesthesia Procedure Notes (Signed)
Procedure Name: LMA Insertion Date/Time: 05/30/2021 7:46 AM Performed by: Silvana Newness, CRNA Pre-anesthesia Checklist: Patient identified, Patient being monitored, Timeout performed, Emergency Drugs available and Suction available Patient Re-evaluated:Patient Re-evaluated prior to induction Oxygen Delivery Method: Circle system utilized Preoxygenation: Pre-oxygenation with 100% oxygen Induction Type: IV induction Ventilation: Mask ventilation without difficulty LMA: LMA inserted Laryngoscope Size: 4 Tube type: Oral Number of attempts: 1 Placement Confirmation: positive ETCO2 and breath sounds checked- equal and bilateral Tube secured with: Tape Dental Injury: Teeth and Oropharynx as per pre-operative assessment

## 2021-05-30 NOTE — H&P (Addendum)
PREOPERATIVE H&P  Chief Complaint: Medial and Lateral Meniscus Repair  HPI: Alexander Carey is a 70 y.o. male who presents for preoperative history and physical with a diagnosis of right knee Medial and Lateral Meniscus tears confirmed by MRI. Symptoms of pain, clicking/popping and occasional locking are significantly impairing activities of daily living.  Patient failed non-op management and wished to proceed with a right knee arthroscopic partial medial and lateral meniscectomies.    Past Medical History:  Diagnosis Date   AF (atrial fibrillation) ([facility])    Back pain    lower   Basal cell carcinoma 11/01/2019   nasal tip, right ear concha   ED (erectile dysfunction)    ED (erectile dysfunction)    Enlarged prostate    Essential hypertension    HOH (hard of hearing)    Hypogonadism in male    Hypothyroidism    Paroxysmal A-fib ([place])    a. 07/2018 s/p PVI.   Past Surgical History:  Procedure Laterality Date   ATRIAL FIBRILLATION ABLATION N/A 07/27/2018   Procedure: ATRIAL FIBRILLATION ABLATION;  Surgeon: Thompson Grayer, MD;  Location: [facility];  Service: Cardiovascular;  Laterality: N/A;   CARDIOVERSION N/A 01/13/2018   Procedure: CARDIOVERSION;  Surgeon: Nelva Bush, MD;  Location: [facility];  Service: Cardiovascular;  Laterality: N/A;   CARDIOVERSION N/A 01/26/2018   Procedure: CARDIOVERSION;  Surgeon: Minna Merritts, MD;  Location: [facility];  Service: Cardiovascular;  Laterality: N/A;   CARDIOVERSION N/A 03/01/2018   Procedure: CARDIOVERSION;  Surgeon: Minna Merritts, MD;  Location: [facility];  Service: Cardiovascular;  Laterality: N/A;   CARDIOVERSION N/A 05/21/2018   Procedure: CARDIOVERSION;  Surgeon: Wellington Hampshire, MD;  Location: [facility];  Service: Cardiovascular;  Laterality: N/A;   COLONOSCOPY  04/2005   COLONOSCOPY WITH PROPOFOL N/A 03/28/2020   Procedure: COLONOSCOPY WITH PROPOFOL;  Surgeon: Robert Bellow, MD;  Location: [facility];  Service:  Endoscopy;  Laterality: N/A;   TONSILLECTOMY     TRIGGER FINGER RELEASE Left 12/03/2016   Procedure: 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;  Surgeon: Corky Mull, MD;  Location: [facility];  Service: Orthopedics;  Laterality: Left;   Social History   Socioeconomic History   Marital status: Married    Spouse name: Not on file   Number of children: Not on file   Years of education: Not on file   Highest education level: Not on file  Occupational History   Not on file  Tobacco Use   Smoking status: Former    Packs/day: 0.50    Types: Cigarettes    Quit date: 09/15/2008    Years since quitting: 12.7   Smokeless tobacco: Never  Vaping Use   Vaping Use: Never used  Substance and Sexual Activity   Alcohol use: Yes    Comment: on the weekend- has a few shots   Drug use: No   Sexual activity: Not on file  Other Topics Concern   Not on file  Social History Narrative   Lives in [place] with spouse   Owns rental property   Social Determinants of Health   Financial Resource Strain: Not on file  Food Insecurity: Not on file  Transportation Needs: Not on file  Physical Activity: Not on file  Stress: Not on file  Social Connections: Not on file   Family History  Problem Relation Age of Onset   Ovarian cancer Mother    Throat cancer Father  Prostate cancer Neg Hx    Kidney cancer Neg Hx    Allergies  Allergen Reactions   Statins Other (See Comments)    Patient declines any statins   Ciprofloxacin Rash   Prior to Admission medications   Medication Sig Start Date End Date Taking? Authorizing Provider  acetaminophen (TYLENOL) 650 MG CR tablet Take 650 mg by mouth as needed for pain.    Yes [provider]  dutasteride (AVODART) 0.5 MG capsule Take 1 capsule (0.5 mg total) by mouth daily. 09/24/20  Yes McGowan, Shannon A, PA-C  ELIQUIS 5 MG TABS tablet TAKE (1) TABLET BY MOUTH TWICE DAILY 01/31/21  Yes Allred, Jeneen Rinks, MD   Ipratropium-Albuterol (COMBIVENT) 20-100 MCG/ACT AERS respimat Inhale 1 puff into the lungs every 6 (six) hours as needed for wheezing or shortness of breath.  10/13/17  Yes [provider]  levothyroxine (SYNTHROID, LEVOTHROID) 25 MCG tablet Take 1 tablet (25 mcg total) by mouth daily before breakfast. 06/15/18  Yes Deboraha Sprang, MD  losartan (COZAAR) 50 MG tablet Take 1 tablet (50 mg total) by mouth daily. 01/07/21  Yes Allred, Jeneen Rinks, MD  metoprolol tartrate (LOPRESSOR) 25 MG tablet Take 0.5 tablets (12.5 mg total) by mouth 2 (two) times daily. 01/28/21  Yes Allred, Jeneen Rinks, MD  tamsulosin (FLOMAX) 0.4 MG CAPS capsule Take 1 capsule (0.4 mg total) by mouth daily. 09/24/20  Yes McGowan, Larene Beach A, PA-C  Meth-Hyo-M Bl-Na Phos-Ph Sal (URIBEL) 118 MG CAPS Take 1 capsule (118 mg total) by mouth 2 (two) times daily as needed. Patient not taking: Reported on 05/21/2021 03/13/20   Zara Council A, PA-C     Positive ROS: All other systems have been reviewed and were otherwise negative with the exception of those mentioned in the HPI and as above.  Physical Exam: General: Alert, no acute distress Cardiovascular: Regular rate and rhythm, no murmurs rubs or gallops.  No pedal edema Respiratory: Clear to auscultation bilaterally, no wheezes rales or rhonchi. No cyanosis, no use of accessory musculature GI: No organomegaly, abdomen is soft and non-tender nondistended with positive bowel sounds. Skin: Skin intact, no lesions within the operative field. Neurologic: Sensation intact distally Psychiatric: Patient is competent for consent with normal mood and affect Lymphatic: No cervical lymphadenopathy  MUSCULOSKELETAL: Right Knee: The patient can fully extend and flex to approximately 120 degrees. He has tenderness over the medial joint line and pain with McMurray's testing. He has no ligamentous laxity. He has patellofemoral crepitus, but no grind or apprehension. He has no calf tenderness or lower  leg edema. He has palpable pedal pulses, intact sensation to light touch and intact motor function distally.  RADIOLOGY: MRI Right knee: 1. Medial meniscus: Horizontal flap tear extending from the posterior body into the inner third of the posterior horn.  Small meniscal undersurface component inferior to the joint line in the medial recess at the level the posterior body, so-called "comma sign." 2. Lateral meniscus: Chronic trizonal horizontal tearing in the body extending into the inner third of the posterior horn. 3. Mucoid degenerative changes of the ACL.  Associated ACL ganglion extending through the posterior capsule sparing the popliteal neurovascular bundle. 4. Focal superior median chondromalacia with underlying subchondral edema.  Tiny lateral patellar facet partial-thickness fissure. 5. Small volume suprapatellar fluid.  Normal bursae.   Assessment: Medial and Lateral Meniscus Repair, right knee  Plan: Plan for Procedure(s): RIGHT KNEE ARTHROSCOPY WITH MEDIAL and LATERAL MENISECTOMIES  I reviewed the details of the operation and the post-op course  with the patient and his wife today.  A pre-op history and physical was performed at the bedside today.  I marked the right knee according to the hospital's correct site of surgery protocol.  I have reviewed the right knee MRI in preparation for this case.  Patient has stopped his eliquis in preparation for this case.  I discussed the risks and benefits of surgery. The risks include but are not limited to infection, bleeding, nerve or blood vessel injury, joint stiffness or loss of motion, persistent pain, weakness or instability, retear of the meniscus, osteoarthritis and hardware failure and the need for further surgery. Medical risks include but are not limited to DVT and pulmonary embolism, myocardial infarction, stroke, pneumonia, respiratory failure and death. Patient understood these risks and wished to proceed.    Thornton Park, MD   05/30/2021 7:35 AM

## 2021-05-30 NOTE — Anesthesia Preprocedure Evaluation (Signed)
Anesthesia Evaluation  Patient identified by MRN, date of birth, ID band Patient awake    Reviewed: Allergy & Precautions, H&P , NPO status , Patient's Chart, lab work & pertinent test results  Airway Mallampati: III  TM Distance: >3 FB Neck ROM: full    Dental no notable dental hx.    Pulmonary COPD,  COPD inhaler, former smoker,    Pulmonary exam normal breath sounds clear to auscultation       Cardiovascular hypertension, Normal cardiovascular exam+ dysrhythmias Atrial Fibrillation  Rhythm:regular Rate:Normal     Neuro/Psych    GI/Hepatic   Endo/Other  Hypothyroidism Morbid obesity  Renal/GU      Musculoskeletal  (+) Arthritis ,   Abdominal   Peds  Hematology   Anesthesia Other Findings   Reproductive/Obstetrics                             Anesthesia Physical Anesthesia Plan  ASA: 3  Anesthesia Plan: General LMA   Post-op Pain Management:    Induction:   PONV Risk Score and Plan: 2 and Treatment may vary due to age or medical condition, Ondansetron and Dexamethasone  Airway Management Planned:   Additional Equipment:   Intra-op Plan:   Post-operative Plan:   Informed Consent: I have reviewed the patients History and Physical, chart, labs and discussed the procedure including the risks, benefits and alternatives for the proposed anesthesia with the patient or authorized representative who has indicated his/her understanding and acceptance.     Dental Advisory Given  Plan Discussed with: CRNA  Anesthesia Plan Comments:         Anesthesia Quick Evaluation

## 2021-05-30 NOTE — Transfer of Care (Signed)
Immediate Anesthesia Transfer of Care Note  Patient: Alexander Carey  Procedure(s) Performed: KNEE ARTHROSCOPY WITH MEDIAL AND LATERAL MENISECTOMIES AND CHONDROPLASTY OF THE PATELLA (Right: Knee)  Patient Location: PACU  Anesthesia Type: General LMA  Level of Consciousness: awake, alert  and patient cooperative  Airway and Oxygen Therapy: Patient Spontanous Breathing and Patient connected to supplemental oxygen  Post-op Assessment: Post-op Vital signs reviewed, Patient's Cardiovascular Status Stable, Respiratory Function Stable, Patent Airway and No signs of Nausea or vomiting  Post-op Vital Signs: Reviewed and stable  Complications: No notable events documented.

## 2021-05-30 NOTE — Op Note (Addendum)
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  SURGEON:  Thornton Park, MD  ANESTHESIA:   General  PREOPERATIVE INDICATIONS:  Alexander Carey  70 y.o. male with a diagnosis of tears of the medial and lateral menisci who failed conservative management and elected to proceed with right knee arthroscopic partial medial and lateral meniscectomies.  The risks benefits and alternatives were discussed with the patient preoperatively including the risks of infection, bleeding, nerve injury, knee stiffness, persistent pain, osteoarthritis and the need for further surgery. Medical  risks include DVT and pulmonary embolism, myocardial infarction, stroke, pneumonia, respiratory failure and death. The patient understood these risks and wished to proceed.  OPERATIVE FINDINGS: Patient had complex tears of the medial and lateral menisci.  Patient had significant synovitis and chondromalacia on the undersurface of the patella.  Mild fraying and fissuring of the medial femoral condyle but no focal chondral defects.  Patient had mild fraying of the ACL without tear.  OPERATIVE PROCEDURE: Patient was met in the preoperative area. The operative extremity was signed with the word yes and my initials according the hospital's correct site of surgery protocol.  Patient's wife was at the bedside.  A preop history and physical was performed at the bedside.  I marked the right knee according the hospital's correct site of surgery protocol.  The patient was brought to the operating room where they was placed supine on the operative table. General anesthesia was administered. The patient was prepped and  draped in a sterile fashion.  A timeout was performed to verify the patient's name, date of birth, medical record number, correct site of surgery correct procedure to be performed. It was also used to verify the patient received antibiotics that all appropriate instruments, and radiographic studies were available in the room. Once all in attendance were in agreement, the case began.  Proposed arthroscopy incisions were drawn out with a surgical marker. These were pre-injected with 1% lidocaine plain. An 11 blade was used to establish an inferior lateral and inferomedial portals. The inferomedial portal was created using a 18-gauge spinal needle under direct visualization.  A full diagnostic examination of the knee was performed including the suprapatellar pouch, patellofemoral joint, medial lateral compartments as well as the medial lateral gutters, the intercondylar notch in the posterior knee.  Patient had significant synovitis and Hoffa's fat pad hypertrophy which was debrided using a 4 oh shaver blade and 90 degree wand.  Patient had the medial meniscal tear treated with a 3.5 and 4.0 shaver blades and straight duckbill basket. The meniscus was debrided until a stable rim was achieved and confirmed with probing.  The patient's knee was then placed in a figure-of-four position.  Patient had significant fraying of the lateral meniscus with a tear involving the posterior horn near the posterior root without root detachment.  The lateral meniscus tear was also treated with a straight duckbill biter and 4. tapered shaver blade.    Finally, a chondroplasty of the undersurface of patella was also performed using a 4.0 tapered shaver blade.  Final arthroscopic images were taken.  The knee was copiously lavaged to remove all meniscal debris.  All arthroscopic instruments were removed. The 2 arthroscopy portals were closed with 4-0 nylon. Steri-Strips were  applied along with a dry sterile and compressive  dressing.  Patient was injected with a 50: 50 mix of 1% lidocaine with epi and half percent Marcaine plain x30 cc intra-articularly for postop analgesia.    The patient was brought to the PACU in stable condition. I was scrubbed and present for the entire case and all sharp, and instrument counts were correct at the conclusion the case. I spoke with the patient's wife postoperatively to let her know the case was performed without complication and the patient was stable in the recovery room.    Timoteo Gaul, MD

## 2021-05-31 ENCOUNTER — Encounter: Payer: Self-pay | Admitting: Orthopedic Surgery

## 2021-06-04 DIAGNOSIS — E039 Hypothyroidism, unspecified: Secondary | ICD-10-CM | POA: Diagnosis not present

## 2021-06-04 DIAGNOSIS — Z136 Encounter for screening for cardiovascular disorders: Secondary | ICD-10-CM | POA: Diagnosis not present

## 2021-06-04 DIAGNOSIS — I480 Paroxysmal atrial fibrillation: Secondary | ICD-10-CM | POA: Diagnosis not present

## 2021-06-04 DIAGNOSIS — E785 Hyperlipidemia, unspecified: Secondary | ICD-10-CM | POA: Diagnosis not present

## 2021-06-04 DIAGNOSIS — Z125 Encounter for screening for malignant neoplasm of prostate: Secondary | ICD-10-CM | POA: Diagnosis not present

## 2021-06-04 DIAGNOSIS — Z0000 Encounter for general adult medical examination without abnormal findings: Secondary | ICD-10-CM | POA: Diagnosis not present

## 2021-06-19 ENCOUNTER — Other Ambulatory Visit: Payer: Self-pay | Admitting: Orthopedic Surgery

## 2021-06-19 DIAGNOSIS — M009 Pyogenic arthritis, unspecified: Secondary | ICD-10-CM

## 2021-06-20 ENCOUNTER — Encounter
Admission: RE | Disposition: A | Discharge status: Home / Self Care | Payer: Self-pay | Source: Home / Self Care | Attending: Orthopedic Surgery

## 2021-06-20 ENCOUNTER — Ambulatory Visit: Payer: PPO | Admitting: Certified Registered Nurse Anesthetist

## 2021-06-20 ENCOUNTER — Encounter: Payer: Self-pay | Admitting: Orthopedic Surgery

## 2021-06-20 ENCOUNTER — Other Ambulatory Visit: Payer: Self-pay

## 2021-06-20 ENCOUNTER — Observation Stay
Admission: RE | Admit: 2021-06-20 | Discharge: 2021-06-21 | Disposition: A | Discharge status: Home / Self Care | Payer: PPO | Attending: Orthopedic Surgery | Admitting: Orthopedic Surgery

## 2021-06-20 DIAGNOSIS — M65861 Other synovitis and tenosynovitis, right lower leg: Secondary | ICD-10-CM | POA: Diagnosis not present

## 2021-06-20 DIAGNOSIS — E039 Hypothyroidism, unspecified: Secondary | ICD-10-CM | POA: Diagnosis not present

## 2021-06-20 DIAGNOSIS — I10 Essential (primary) hypertension: Secondary | ICD-10-CM | POA: Diagnosis not present

## 2021-06-20 DIAGNOSIS — Y792 Prosthetic and other implants, materials and accessory orthopedic devices associated with adverse incidents: Secondary | ICD-10-CM | POA: Diagnosis not present

## 2021-06-20 DIAGNOSIS — M009 Pyogenic arthritis, unspecified: Secondary | ICD-10-CM | POA: Diagnosis present

## 2021-06-20 DIAGNOSIS — T8142XA Infection following a procedure, deep incisional surgical site, initial encounter: Secondary | ICD-10-CM | POA: Diagnosis not present

## 2021-06-20 DIAGNOSIS — Z87891 Personal history of nicotine dependence: Secondary | ICD-10-CM | POA: Diagnosis not present

## 2021-06-20 DIAGNOSIS — Z85828 Personal history of other malignant neoplasm of skin: Secondary | ICD-10-CM | POA: Insufficient documentation

## 2021-06-20 DIAGNOSIS — Z01810 Encounter for preprocedural cardiovascular examination: Secondary | ICD-10-CM | POA: Diagnosis not present

## 2021-06-20 DIAGNOSIS — I480 Paroxysmal atrial fibrillation: Secondary | ICD-10-CM | POA: Diagnosis not present

## 2021-06-20 DIAGNOSIS — M01X61 Direct infection of right knee in infectious and parasitic diseases classified elsewhere: Secondary | ICD-10-CM | POA: Diagnosis not present

## 2021-06-20 DIAGNOSIS — Z79899 Other long term (current) drug therapy: Secondary | ICD-10-CM | POA: Insufficient documentation

## 2021-06-20 HISTORY — PX: KNEE ARTHROSCOPY: SHX127

## 2021-06-20 LAB — CBC WITH DIFFERENTIAL/PLATELET
Abs Immature Granulocytes: 0.03 10*3/uL (ref 0.00–0.07)
Basophils Absolute: 0 10*3/uL (ref 0.0–0.1)
Basophils Relative: 1 %
Eosinophils Absolute: 0.1 10*3/uL (ref 0.0–0.5)
Eosinophils Relative: 2 %
HCT: 41 % (ref 39.0–52.0)
Hemoglobin: 14.4 g/dL (ref 13.0–17.0)
Immature Granulocytes: 1 %
Lymphocytes Relative: 16 %
Lymphs Abs: 1 10*3/uL (ref 0.7–4.0)
MCH: 36.6 pg — ABNORMAL HIGH (ref 26.0–34.0)
MCHC: 35.1 g/dL (ref 30.0–36.0)
MCV: 104.3 fL — ABNORMAL HIGH (ref 80.0–100.0)
Monocytes Absolute: 0.8 10*3/uL (ref 0.1–1.0)
Monocytes Relative: 13 %
Neutro Abs: 4.2 10*3/uL (ref 1.7–7.7)
Neutrophils Relative %: 67 %
Platelets: 226 10*3/uL (ref 150–400)
RBC: 3.93 MIL/uL — ABNORMAL LOW (ref 4.22–5.81)
RDW: 12.1 % (ref 11.5–15.5)
WBC: 6.2 10*3/uL (ref 4.0–10.5)
nRBC: 0 % (ref 0.0–0.2)

## 2021-06-20 LAB — SEDIMENTATION RATE: Sed Rate: 34 mm/hr — ABNORMAL HIGH (ref 0–20)

## 2021-06-20 LAB — SYNOVIAL CELL COUNT + DIFF, W/ CRYSTALS
Crystals, Fluid: NONE SEEN
Eosinophils-Synovial: 0 %
Lymphocytes-Synovial Fld: 2 %
Monocyte-Macrophage-Synovial Fluid: 0 %
Neutrophil, Synovial: 97 %
Other Cells-SYN: 0
WBC, Synovial: 44993 /mm3 — ABNORMAL HIGH (ref 0–200)

## 2021-06-20 LAB — BASIC METABOLIC PANEL
Anion gap: 8 (ref 5–15)
BUN: 11 mg/dL (ref 8–23)
CO2: 23 mmol/L (ref 22–32)
Calcium: 8.8 mg/dL — ABNORMAL LOW (ref 8.9–10.3)
Chloride: 102 mmol/L (ref 98–111)
Creatinine, Ser: 0.97 mg/dL (ref 0.61–1.24)
GFR, Estimated: >60 mL/min (ref >60)
Glucose, Bld: 115 mg/dL — ABNORMAL HIGH (ref 70–99)
Potassium: 4.5 mmol/L (ref 3.5–5.1)
Sodium: 133 mmol/L — ABNORMAL LOW (ref 135–145)

## 2021-06-20 LAB — C-REACTIVE PROTEIN: CRP: 7.7 mg/dL — ABNORMAL HIGH (ref <1.0)

## 2021-06-20 SURGERY — ARTHROSCOPY, KNEE
Anesthesia: General | Site: Knee | Laterality: Right

## 2021-06-20 MED ORDER — PROPOFOL 10 MG/ML IV BOLUS
INTRAVENOUS | Status: DC | PRN
  Administered 2021-06-20: 180 mg via INTRAVENOUS
  Administered 2021-06-20: 20 mg via INTRAVENOUS

## 2021-06-20 MED ORDER — VANCOMYCIN HCL 2000 MG/400ML IV SOLN
2000.0000 mg | Freq: Once | INTRAVENOUS | Status: AC
  Administered 2021-06-20: 2000 mg via INTRAVENOUS
  Filled 2021-06-20: qty 400

## 2021-06-20 MED ORDER — CEFAZOLIN SODIUM-DEXTROSE 2-4 GM/100ML-% IV SOLN
2.0000 g | INTRAVENOUS | Status: AC
  Administered 2021-06-20: 2 g via INTRAVENOUS

## 2021-06-20 MED ORDER — METHOCARBAMOL 1000 MG/10ML IJ SOLN
500.0000 mg | Freq: Four times a day (QID) | INTRAVENOUS | Status: DC | PRN
  Filled 2021-06-20: qty 5

## 2021-06-20 MED ORDER — ORAL CARE MOUTH RINSE: 15.0000 mL | Freq: Once | OROMUCOSAL | Status: AC

## 2021-06-20 MED ORDER — CHLORHEXIDINE GLUCONATE CLOTH 2 % EX PADS: 6.0000 | MEDICATED_PAD | Freq: Once | CUTANEOUS | Status: DC

## 2021-06-20 MED ORDER — VANCOMYCIN HCL 1250 MG/250ML IV SOLN
1250.0000 mg | INTRAVENOUS | Status: DC
  Filled 2021-06-20: qty 250

## 2021-06-20 MED ORDER — PIPERACILLIN-TAZOBACTAM 3.375 G IVPB
3.3750 g | Freq: Three times a day (TID) | INTRAVENOUS | Status: DC
  Administered 2021-06-20 – 2021-06-21 (×3): 3.375 g via INTRAVENOUS
  Filled 2021-06-20 (×2): qty 50

## 2021-06-20 MED ORDER — MORPHINE SULFATE (PF) 2 MG/ML IV SOLN: 0.5000 mg | INTRAVENOUS | Status: DC | PRN

## 2021-06-20 MED ORDER — LACTATED RINGERS IV SOLN: INTRAVENOUS | Status: DC

## 2021-06-20 MED ORDER — ACETAMINOPHEN 500 MG PO TABS
ORAL_TABLET | ORAL | Status: AC
  Administered 2021-06-20: 1000 mg via ORAL
  Filled 2021-06-20: qty 2

## 2021-06-20 MED ORDER — BUPIVACAINE-EPINEPHRINE (PF) 0.25% -1:200000 IJ SOLN
INTRAMUSCULAR | Status: AC
  Filled 2021-06-20: qty 30

## 2021-06-20 MED ORDER — CHLORHEXIDINE GLUCONATE 0.12 % MT SOLN
OROMUCOSAL | Status: AC
  Administered 2021-06-20: 15 mL via OROMUCOSAL
  Filled 2021-06-20: qty 15

## 2021-06-20 MED ORDER — LOSARTAN POTASSIUM 50 MG PO TABS
50.0000 mg | ORAL_TABLET | Freq: Every day | ORAL | Status: DC
  Administered 2021-06-20 – 2021-06-21 (×2): 50 mg via ORAL
  Filled 2021-06-20: qty 1

## 2021-06-20 MED ORDER — SENNA 8.6 MG PO TABS
1.0000 | ORAL_TABLET | Freq: Two times a day (BID) | ORAL | Status: DC
  Administered 2021-06-20 – 2021-06-21 (×3): 8.6 mg via ORAL
  Filled 2021-06-20 (×2): qty 1

## 2021-06-20 MED ORDER — FENTANYL CITRATE (PF) 100 MCG/2ML IJ SOLN
INTRAMUSCULAR | Status: DC | PRN
  Administered 2021-06-20 (×4): 25 ug via INTRAVENOUS

## 2021-06-20 MED ORDER — BUPIVACAINE-EPINEPHRINE 0.25% -1:200000 IJ SOLN
INTRAMUSCULAR | Status: DC | PRN
  Administered 2021-06-20: 30 mL

## 2021-06-20 MED ORDER — SEVOFLURANE IN SOLN
RESPIRATORY_TRACT | Status: AC
  Filled 2021-06-20: qty 250

## 2021-06-20 MED ORDER — FENTANYL CITRATE (PF) 100 MCG/2ML IJ SOLN: 25.0000 ug | INTRAMUSCULAR | Status: DC | PRN

## 2021-06-20 MED ORDER — POLYETHYLENE GLYCOL 3350 17 G PO PACK: 17.0000 g | PACK | Freq: Every day | ORAL | Status: DC | PRN

## 2021-06-20 MED ORDER — ONDANSETRON HCL 4 MG/2ML IJ SOLN: 4.0000 mg | Freq: Four times a day (QID) | INTRAMUSCULAR | Status: DC | PRN

## 2021-06-20 MED ORDER — MENTHOL 3 MG MT LOZG
1.0000 | LOZENGE | OROMUCOSAL | Status: DC | PRN
  Filled 2021-06-20: qty 9

## 2021-06-20 MED ORDER — LIDOCAINE HCL (PF) 2 % IJ SOLN
INTRAMUSCULAR | Status: AC
  Filled 2021-06-20: qty 5

## 2021-06-20 MED ORDER — ONDANSETRON HCL 4 MG/2ML IJ SOLN
INTRAMUSCULAR | Status: DC | PRN
  Administered 2021-06-20: 4 mg via INTRAVENOUS

## 2021-06-20 MED ORDER — PROPOFOL 10 MG/ML IV BOLUS
INTRAVENOUS | Status: AC
  Filled 2021-06-20: qty 20

## 2021-06-20 MED ORDER — ALUM & MAG HYDROXIDE-SIMETH 200-200-20 MG/5ML PO SUSP: 30.0000 mL | ORAL | Status: DC | PRN

## 2021-06-20 MED ORDER — METOPROLOL TARTRATE 25 MG PO TABS
12.5000 mg | ORAL_TABLET | Freq: Two times a day (BID) | ORAL | Status: DC
  Administered 2021-06-20 – 2021-06-21 (×3): 12.5 mg via ORAL
  Filled 2021-06-20 (×2): qty 1

## 2021-06-20 MED ORDER — CEFAZOLIN SODIUM-DEXTROSE 2-4 GM/100ML-% IV SOLN
INTRAVENOUS | Status: AC
  Filled 2021-06-20: qty 100

## 2021-06-20 MED ORDER — IPRATROPIUM-ALBUTEROL 0.5-2.5 (3) MG/3ML IN SOLN: 3.0000 mL | Freq: Four times a day (QID) | RESPIRATORY_TRACT | Status: DC | PRN

## 2021-06-20 MED ORDER — ONDANSETRON HCL 4 MG/2ML IJ SOLN: 4.0000 mg | Freq: Once | INTRAMUSCULAR | Status: DC | PRN

## 2021-06-20 MED ORDER — DUTASTERIDE 0.5 MG PO CAPS
0.5000 mg | ORAL_CAPSULE | Freq: Every day | ORAL | Status: DC
  Administered 2021-06-20 – 2021-06-21 (×2): 0.5 mg via ORAL
  Filled 2021-06-20 (×2): qty 1

## 2021-06-20 MED ORDER — BISACODYL 10 MG RE SUPP: 10.0000 mg | Freq: Every day | RECTAL | Status: DC | PRN

## 2021-06-20 MED ORDER — LIDOCAINE HCL (CARDIAC) PF 100 MG/5ML IV SOSY
PREFILLED_SYRINGE | INTRAVENOUS | Status: DC | PRN
  Administered 2021-06-20: 100 mg via INTRAVENOUS

## 2021-06-20 MED ORDER — LIDOCAINE HCL (PF) 1 % IJ SOLN
INTRAMUSCULAR | Status: DC | PRN
Start: 2021-06-20 — End: 2021-06-20
  Administered 2021-06-20: 10 mL

## 2021-06-20 MED ORDER — PHENOL 1.4 % MT LIQD
1.0000 | OROMUCOSAL | Status: DC | PRN
  Filled 2021-06-20: qty 177

## 2021-06-20 MED ORDER — ONDANSETRON HCL 4 MG/2ML IJ SOLN
INTRAMUSCULAR | Status: AC
  Filled 2021-06-20: qty 2

## 2021-06-20 MED ORDER — DOCUSATE SODIUM 100 MG PO CAPS
100.0000 mg | ORAL_CAPSULE | Freq: Two times a day (BID) | ORAL | Status: DC
  Administered 2021-06-20 – 2021-06-21 (×3): 100 mg via ORAL
  Filled 2021-06-20 (×2): qty 1

## 2021-06-20 MED ORDER — TRAMADOL HCL 50 MG PO TABS
50.0000 mg | ORAL_TABLET | Freq: Four times a day (QID) | ORAL | Status: DC
Start: 2021-06-20 — End: 2021-06-21
  Administered 2021-06-20 – 2021-06-21 (×4): 50 mg via ORAL
  Filled 2021-06-20 (×4): qty 1

## 2021-06-20 MED ORDER — METHOCARBAMOL 500 MG PO TABS: 500.0000 mg | ORAL_TABLET | Freq: Four times a day (QID) | ORAL | Status: DC | PRN

## 2021-06-20 MED ORDER — DEXAMETHASONE SODIUM PHOSPHATE 10 MG/ML IJ SOLN
INTRAMUSCULAR | Status: AC
  Filled 2021-06-20: qty 1

## 2021-06-20 MED ORDER — TAMSULOSIN HCL 0.4 MG PO CAPS
0.4000 mg | ORAL_CAPSULE | Freq: Every day | ORAL | Status: DC
  Administered 2021-06-20 – 2021-06-21 (×2): 0.4 mg via ORAL
  Filled 2021-06-20: qty 1

## 2021-06-20 MED ORDER — LEVOTHYROXINE SODIUM 25 MCG PO TABS
25.0000 ug | ORAL_TABLET | Freq: Every day | ORAL | Status: DC
  Administered 2021-06-21: 25 ug via ORAL
  Filled 2021-06-20: qty 1

## 2021-06-20 MED ORDER — DEXAMETHASONE SODIUM PHOSPHATE 10 MG/ML IJ SOLN
INTRAMUSCULAR | Status: DC | PRN
  Administered 2021-06-20: 10 mg via INTRAVENOUS

## 2021-06-20 MED ORDER — HYDROCODONE-ACETAMINOPHEN 5-325 MG PO TABS: 1.0000 | ORAL_TABLET | ORAL | Status: DC | PRN

## 2021-06-20 MED ORDER — APIXABAN 5 MG PO TABS
5.0000 mg | ORAL_TABLET | Freq: Two times a day (BID) | ORAL | Status: DC
  Administered 2021-06-21: 5 mg via ORAL
  Filled 2021-06-20: qty 1

## 2021-06-20 MED ORDER — ONDANSETRON HCL 4 MG PO TABS: 4.0000 mg | ORAL_TABLET | Freq: Four times a day (QID) | ORAL | Status: DC | PRN

## 2021-06-20 MED ORDER — RINGERS IRRIGATION IR SOLN
Status: DC | PRN
  Administered 2021-06-20: 12000 mL

## 2021-06-20 MED ORDER — ACETAMINOPHEN 325 MG PO TABS: 325.0000 mg | ORAL_TABLET | Freq: Four times a day (QID) | ORAL | Status: DC | PRN

## 2021-06-20 MED ORDER — CHLORHEXIDINE GLUCONATE 0.12 % MT SOLN: 15.0000 mL | Freq: Once | OROMUCOSAL | Status: AC

## 2021-06-20 MED ORDER — ACETAMINOPHEN 500 MG PO TABS: 1000.0000 mg | ORAL_TABLET | ORAL | Status: AC

## 2021-06-20 MED ORDER — VANCOMYCIN HCL IN DEXTROSE 1-5 GM/200ML-% IV SOLN
1000.0000 mg | Freq: Two times a day (BID) | INTRAVENOUS | Status: DC
Start: 2021-06-20 — End: 2021-06-20

## 2021-06-20 MED ORDER — SODIUM CHLORIDE 0.9 % IV SOLN
75.0000 mL/h | INTRAVENOUS | Status: DC
  Administered 2021-06-20: 75 mL/h via INTRAVENOUS

## 2021-06-20 MED ORDER — PHENYLEPHRINE HCL (PRESSORS) 10 MG/ML IV SOLN
INTRAVENOUS | Status: DC | PRN
  Administered 2021-06-20: 200 ug via INTRAVENOUS

## 2021-06-20 MED ORDER — LIDOCAINE HCL (PF) 1 % IJ SOLN
INTRAMUSCULAR | Status: AC
  Filled 2021-06-20: qty 30

## 2021-06-20 MED ORDER — PIPERACILLIN-TAZOBACTAM 3.375 G IVPB 30 MIN: 3.3750 g | Freq: Three times a day (TID) | INTRAVENOUS | Status: DC

## 2021-06-20 MED ORDER — FENTANYL CITRATE (PF) 100 MCG/2ML IJ SOLN
INTRAMUSCULAR | Status: AC
  Filled 2021-06-20: qty 2

## 2021-06-20 SURGICAL SUPPLY — 48 items
ADAPTER IRRIG TUBE 2 SPIKE SOL (ADAPTER) IMPLANT
BLADE FULL RADIUS 3.5 (BLADE) IMPLANT
BLADE SHAVER 4.5X7 STR FR (MISCELLANEOUS) IMPLANT
BUR BR 5.5 WIDE MOUTH (BURR) IMPLANT
CNTNR SPEC 2.5X3XGRAD LEK (MISCELLANEOUS)
CONT SPEC 4OZ STER OR WHT (MISCELLANEOUS)
CONTAINER SPEC 2.5X3XGRAD LEK (MISCELLANEOUS) IMPLANT
COOLER POLAR GLACIER W/PUMP (MISCELLANEOUS) IMPLANT
CUFF TOURN SGL QUICK 24 (TOURNIQUET CUFF)
CUFF TOURN SGL QUICK 34 (TOURNIQUET CUFF) ×1
CUFF TRNQT CYL 24X4X16.5-23 (TOURNIQUET CUFF) IMPLANT
CUFF TRNQT CYL 34X4.125X (TOURNIQUET CUFF) ×2 IMPLANT
DEVICE SUCT BLK HOLE OR FLOOR (MISCELLANEOUS) IMPLANT
DRAPE IMP U-DRAPE 54X76 (DRAPES) ×3 IMPLANT
DURAPREP 26ML APPLICATOR (WOUND CARE) ×3 IMPLANT
GAUZE SPONGE 4X4 12PLY STRL (GAUZE/BANDAGES/DRESSINGS) ×3 IMPLANT
GAUZE XEROFORM 1X8 LF (GAUZE/BANDAGES/DRESSINGS) ×3 IMPLANT
GLOVE SURG ORTHO LTX SZ9 (GLOVE) ×6 IMPLANT
GLOVE SURG UNDER POLY LF SZ9 (GLOVE) ×3 IMPLANT
GOWN STRL REUS TWL 2XL XL LVL4 (GOWN DISPOSABLE) ×3 IMPLANT
GOWN STRL REUS W/ TWL LRG LVL3 (GOWN DISPOSABLE) ×2 IMPLANT
GOWN STRL REUS W/TWL LRG LVL3 (GOWN DISPOSABLE) ×1
IV LACTATED RINGER IRRG 3000ML (IV SOLUTION) ×4
IV LR IRRIG 3000ML ARTHROMATIC (IV SOLUTION) ×8 IMPLANT
KIT TURNOVER KIT A (KITS) ×3 IMPLANT
MANIFOLD NEPTUNE II (INSTRUMENTS) ×6 IMPLANT
MAT ABSORB  FLUID 56X50 GRAY (MISCELLANEOUS) ×1
MAT ABSORB FLUID 56X50 GRAY (MISCELLANEOUS) ×2 IMPLANT
NEEDLE HYPO 22GX1.5 SAFETY (NEEDLE) ×3 IMPLANT
PACK ARTHROSCOPY KNEE (MISCELLANEOUS) ×3 IMPLANT
PAD ABD DERMACEA PRESS 5X9 (GAUZE/BANDAGES/DRESSINGS) ×6 IMPLANT
PAD WRAPON POLAR KNEE (MISCELLANEOUS) IMPLANT
SHAVER BLADE BONE CUTTER  5.5 (BLADE)
SHAVER BLADE BONE CUTTER 4.5 (BLADE) IMPLANT
SHAVER BLADE BONE CUTTER 5.5 (BLADE) IMPLANT
SHAVER BLADE TAPERED BLUNT 4 (BLADE) ×3 IMPLANT
SOL PREP PVP 2OZ (MISCELLANEOUS)
SOLUTION PREP PVP 2OZ (MISCELLANEOUS) IMPLANT
SPONGE T-LAP 18X18 ~~LOC~~+RFID (SPONGE) ×3 IMPLANT
STRIP CLOSURE SKIN 1/2X4 (GAUZE/BANDAGES/DRESSINGS) ×3 IMPLANT
SUT ETHILON 4-0 (SUTURE) ×1
SUT ETHILON 4-0 FS2 18XMFL BLK (SUTURE) ×2
SUTURE ETHLN 4-0 FS2 18XMF BLK (SUTURE) ×2 IMPLANT
TUBING INFLOW SET DBFLO PUMP (TUBING) ×3 IMPLANT
TUBING OUTFLOW SET DBLFO PUMP (TUBING) ×3 IMPLANT
WAND WEREWOLF FLOW 90D (MISCELLANEOUS) ×3 IMPLANT
WATER STERILE IRR 500ML POUR (IV SOLUTION) IMPLANT
WRAPON POLAR PAD KNEE (MISCELLANEOUS)

## 2021-06-20 NOTE — Progress Notes (Signed)
Pharmacy Antibiotic Note  Alexander Carey is a 70 y.o. male admitted on 06/20/2021 with  septic right knee joint . Patient had a right knee arthroscopic partial medial and lateral [facility]iscectomies [facility] and 05/30/2021.  Patient had sutures removed approximately week ago but since that time has had persistent serous drainage from his medial incision.  He was started on oral antibiotics over the weekend but has had persistent serous drainage. Pharmacy has been consulted for vancomycin dosing.  Plan: Vancomycin 2 g IV x1 loading dose followed by 1250 mg IV q24h Est AUC: 511.4 Used Scr 0.97, Vd 0.72, IBW Obtain vancomycin levels around stead state if continued Monitor renal function and adjust dose as clinically indicated   Height: 6' (182.9 cm) Weight: 99.8 kg (220 lb) IBW/kg (Calculated) : 77.6  Temp (24hrs), Avg:97.7 F (36.5 C), Min:97 F (36.1 C), Max:98.1 F (36.7 C)  Recent Labs  Lab 06/20/21 0934  WBC 6.2  CREATININE 0.97    Estimated Creatinine Clearance: 85.5 mL/min (by C-G formula based on SCr of 0.97 mg/dL).    Allergies  Allergen Reactions   Statins Other (See Comments)    Patient declines any statins   Ciprofloxacin Rash    Antimicrobials this admission: 10/6 Zosyn >>  10/6 Vancomycin >>   Dose adjustments this admission:   Microbiology results: 10/6 Wound culture: sent   Thank you for allowing pharmacy to be a part of this patient's care.  Forde Dandy [name] 06/20/2021 3:14 PM

## 2021-06-20 NOTE — Progress Notes (Signed)
POST-OP CHECK: Patient in his hospital room following arthroscopic washout of his right knee.  Patient states he is having mild pain and tightness in his right knee.  Otherwise he has no complaints.    Cloudy yellow synovial fluid was collected during cannula insertion for the patient's right knee arthroscopy.  This synovial fluid was sent off for analysis.  So far the fluid demonstrates a white count of 44,993 with 97% neutrophils.  No crystals were seen.  Patient has a WBC of 6.2 and an ESR of 34 today.  Patient is being admitted for IV antibiotics given the slightly intra-articular infection.  I am starting him on IV vancomycin and Zosyn.  I have consulted infectious disease and will continue to follow up on the patient's gram stain and culture.

## 2021-06-20 NOTE — Transfer of Care (Signed)
Immediate Anesthesia Transfer of Care Note  Patient: Alexander Carey  Procedure(s) Performed: RIGHT KNEE MINOR INCISION AND DRAINAGE OF ABSCESS (Right: Knee)  Patient Location: PACU  Anesthesia Type:General  Level of Consciousness: sedated  Airway & Oxygen Therapy: Patient Spontanous Breathing and Patient connected to face mask oxygen  Post-op Assessment: Report given to RN and Post -op Vital signs reviewed and stable  Post vital signs: Reviewed and stable  Last Vitals:  Vitals Value Taken Time  BP 99/56 06/20/21 1347  Temp    Pulse 74 06/20/21 1347  Resp 13 06/20/21 1347  SpO2 100 % 06/20/21 1347    Last Pain:  Vitals:   06/20/21 0934  TempSrc: Tympanic  PainSc: 5       Patients Stated Pain Goal: 0 (46/60/56 [id number])  Complications: No notable events documented.

## 2021-06-20 NOTE — Op Note (Signed)
06/20/2021  3:04 PM  PATIENT:  [name]    PRE-OPERATIVE DIAGNOSIS:  Right Septic Knee Joint  POST-OPERATIVE DIAGNOSIS:  Same  PROCEDURE:  RIGHT KNEE ARTHROSCOPIC IRRIGATION AND DEBRIDEMENT   SURGEON:  Thornton Park, MD  ANESTHESIA:   General  PREOPERATIVE INDICATIONS:  CAIGE ALMEDA is a  70 y.o. male presented to the clinic yesterday with increasing drainage from his medial portal incision despite 2 days of oral antibiotics including Bactrim and Keflex.  The decision was made to perform an arthroscopic I&D of the right knee.    I discussed the risks and benefits of surgery. The risks include but are not limited to persistent or recurrent infection, bleeding, nerve or blood vessel injury, joint stiffness or loss of motion, persistent pain, weakness or instability, osteoarthritis and the need for further surgery. Medical risks include but are not limited to DVT and pulmonary embolism, myocardial infarction, stroke, pneumonia, respiratory failure and death. Patient understood these risks and wished to proceed.   OPERATIVE PROCEDURE: Patient was met in the preoperative area.  The right knee was marked according hospital's correct site of surgery protocol.  The patient was brought to the operating room was placed supine on the operative table.  She underwent general anesthesia with an LMA.  He was prepped and draped in a sterile fashion.  A timeout was performed of appropriately patient's name, date of birth, medical record number, correct site of surgery and correct procedure to be performed.  Once all in attendance were agreement the case began.  Patient had a small amount of cloudy serous drainage from the medial incision which was swabbed after the knee was prepped.  This was sent to the lab for gram stain and culture.  The patient's previous lateral arthroscopic portal incision was then reopened with 11 blade.  The cannula was placed inside the knee.  Approximately 20-30 cc of  cloudy serous fluid was collected in a specimen cup through the cannula and will also be sent to the lab for culture, Gram stain and crystals and cell count.  She received 2 g of Kefzol IV after the synovial fluid sample was obtained.  The patient's medial arthroscopic portal was then reopened with an 11 blade.  An arthroscopic evaluation of the knee was performed including evaluation of the suprapatellar pouch, medial lateral gutters, the medial lateral compartments and the intercondylar notch.  Patient had no evidence of significant changes compared to his previous arthroscopy.  There is no significant chondral wear or injury.    After diagnostic arthroscopy, an arthroscopic shaver was used to lavage the knee.  Had the shaver placed in the suprapatellar pouch, the medial lateral gutters as well as the medial lateral and compartments the knee.  They werewolf wand was used to perform a limited synovectomy as the patient had synovitis in all 3 compartments of the knee.  12 L of LR was used to irrigate and lavage the right knee.  All arthroscopic instruments were then removed.  Patient had the 2 arthroscopic portals closed with 4-0 nylon.  A dry sterile dressing was applied.  The patient was awoken and brought to the PACU in stable condition.  I spoke with the patient's wife from the PACU by phone to let her know the findings of the procedure and that her husband was stable in the recovery room.

## 2021-06-20 NOTE — Anesthesia Preprocedure Evaluation (Signed)
Anesthesia Evaluation  Patient identified by MRN, date of birth, ID band Patient awake    Reviewed: Allergy & Precautions, NPO status , Patient's Chart, lab work & pertinent test results, reviewed documented beta blocker date and time   History of Anesthesia Complications Negative for: history of anesthetic complications  Airway Mallampati: II       Dental  (+) Dental Advidsory Given, Teeth Intact   Pulmonary neg shortness of breath, sleep apnea (borderline, not using CPAP) , neg COPD, neg recent URI, former smoker,           Cardiovascular hypertension, Pt. on medications and Pt. on home beta blockers (-) angina(-) Past MI and (-) CHF (-) dysrhythmias (-) Valvular Problems/Murmurs     Neuro/Psych neg Seizures    GI/Hepatic Neg liver ROS, neg GERD  ,  Endo/Other  neg diabetesHypothyroidism   Renal/GU negative Renal ROS     Musculoskeletal   Abdominal   Peds  Hematology   Anesthesia Other Findings Past Medical History: No date: AF (atrial fibrillation) (HCC) No date: Back pain     Comment:  lower 11/01/2019: Basal cell carcinoma     Comment:  nasal tip, right ear concha No date: ED (erectile dysfunction) No date: ED (erectile dysfunction) No date: Enlarged prostate No date: Essential hypertension No date: HOH (hard of hearing) No date: Hypogonadism in male No date: Hypothyroidism No date: Paroxysmal A-fib ([place])     Comment:  a. 07/2018 s/p PVI.   Reproductive/Obstetrics                             Anesthesia Physical  Anesthesia Plan  ASA: 3  Anesthesia Plan: General   Post-op Pain Management:    Induction: Intravenous  PONV Risk Score and Plan: 2 and Ondansetron, Dexamethasone and Treatment may vary due to age or medical condition  Airway Management Planned: LMA  Additional Equipment:   Intra-op Plan:   Post-operative Plan: Extubation in OR  Informed Consent: I  have reviewed the patients History and Physical, chart, labs and discussed the procedure including the risks, benefits and alternatives for the proposed anesthesia with the patient or authorized representative who has indicated his/her understanding and acceptance.       Plan Discussed with:   Anesthesia Plan Comments:         Anesthesia Quick Evaluation

## 2021-06-20 NOTE — H&P (Signed)
PREOPERATIVE H&P  Chief Complaint: Right Knee Abscess  HPI: Alexander Carey is a 70 y.o. male who presents for preoperative history and physical with a diagnosis of possible right knee postop infection.  Patient had a right knee arthroscopic partial medial and lateral meniscectomies [facility] and 05/30/2021.  Patient had sutures removed approximately week ago but since that time has had persistent serous drainage from his medial incision.  He was started on oral antibiotics over the weekend but has had persistent serous drainage.  The decision was made to proceed with an arthroscopic washout of the right knee.  Patient has not experienced any fevers or chills.  Past Medical History:  Diagnosis Date   AF (atrial fibrillation) (HCC)    Back pain    lower   Basal cell carcinoma 11/01/2019   nasal tip, right ear concha   ED (erectile dysfunction)    ED (erectile dysfunction)    Enlarged prostate    Essential hypertension    HOH (hard of hearing)    Hypogonadism in male    Hypothyroidism    Paroxysmal A-fib ([place])    a. 07/2018 s/p PVI.   Past Surgical History:  Procedure Laterality Date   ATRIAL FIBRILLATION ABLATION N/A 07/27/2018   Procedure: ATRIAL FIBRILLATION ABLATION;  Surgeon: Thompson Grayer, MD;  Location: [facility];  Service: Cardiovascular;  Laterality: N/A;   CARDIOVERSION N/A 01/13/2018   Procedure: CARDIOVERSION;  Surgeon: Nelva Bush, MD;  Location: [facility];  Service: Cardiovascular;  Laterality: N/A;   CARDIOVERSION N/A 01/26/2018   Procedure: CARDIOVERSION;  Surgeon: Minna Merritts, MD;  Location: [facility];  Service: Cardiovascular;  Laterality: N/A;   CARDIOVERSION N/A 03/01/2018   Procedure: CARDIOVERSION;  Surgeon: Minna Merritts, MD;  Location: [facility];  Service: Cardiovascular;  Laterality: N/A;   CARDIOVERSION N/A 05/21/2018   Procedure: CARDIOVERSION;  Surgeon: Wellington Hampshire, MD;  Location: [facility];  Service: Cardiovascular;   Laterality: N/A;   COLONOSCOPY  04/2005   COLONOSCOPY WITH PROPOFOL N/A 03/28/2020   Procedure: COLONOSCOPY WITH PROPOFOL;  Surgeon: Robert Bellow, MD;  Location: [facility];  Service: Endoscopy;  Laterality: N/A;   KNEE ARTHROSCOPY WITH MEDIAL MENISECTOMY Right 05/30/2021   Procedure: KNEE ARTHROSCOPY WITH MEDIAL AND LATERAL MENISECTOMIES AND CHONDROPLASTY OF THE PATELLA;  Surgeon: Thornton Park, MD;  Location: [facility];  Service: Orthopedics;  Laterality: Right;   TONSILLECTOMY     TRIGGER FINGER RELEASE Left 12/03/2016   Procedure: 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;  Surgeon: Corky Mull, MD;  Location: [facility];  Service: Orthopedics;  Laterality: Left;   Social History   Socioeconomic History   Marital status: Married    Spouse name: Not on file   Number of children: Not on file   Years of education: Not on file   Highest education level: Not on file  Occupational History   Not on file  Tobacco Use   Smoking status: Former    Packs/day: 0.50    Types: Cigarettes    Quit date: 09/15/2008    Years since quitting: 12.7   Smokeless tobacco: Never  Vaping Use   Vaping Use: Never used  Substance and Sexual Activity   Alcohol use: Yes    Comment: on the weekend- has a few shots   Drug use: No   Sexual activity: Not on file  Other Topics Concern   Not on file  Social History Narrative  Lives in [place] with spouse   Owns rental property   Social Determinants of Health   Financial Resource Strain: Not on file  Food Insecurity: Not on file  Transportation Needs: Not on file  Physical Activity: Not on file  Stress: Not on file  Social Connections: Not on file   Family History  Problem Relation Age of Onset   Ovarian cancer Mother    Throat cancer Father    Prostate cancer Neg Hx    Kidney cancer Neg Hx    Allergies  Allergen Reactions   Statins Other (See Comments)    Patient declines any  statins   Ciprofloxacin Rash   Prior to Admission medications   Medication Sig Start Date End Date Taking? Authorizing Provider  acetaminophen (TYLENOL) 650 MG CR tablet Take 650 mg by mouth as needed for pain.    Yes [provider]  cephALEXin (KEFLEX) 500 MG capsule Take 500 mg by mouth 4 (four) times daily.   Yes [provider]  dutasteride (AVODART) 0.5 MG capsule Take 1 capsule (0.5 mg total) by mouth daily. 09/24/20  Yes McGowan, Shannon A, PA-C  ELIQUIS 5 MG TABS tablet TAKE (1) TABLET BY MOUTH TWICE DAILY 01/31/21  Yes Allred, Jeneen Rinks, MD  Ipratropium-Albuterol (COMBIVENT) 20-100 MCG/ACT AERS respimat Inhale 1 puff into the lungs every 6 (six) hours as needed for wheezing or shortness of breath.  10/13/17  Yes [provider]  levothyroxine (SYNTHROID, LEVOTHROID) 25 MCG tablet Take 1 tablet (25 mcg total) by mouth daily before breakfast. 06/15/18  Yes Deboraha Sprang, MD  losartan (COZAAR) 50 MG tablet Take 1 tablet (50 mg total) by mouth daily. 01/07/21  Yes Allred, Jeneen Rinks, MD  metoprolol tartrate (LOPRESSOR) 25 MG tablet Take 0.5 tablets (12.5 mg total) by mouth 2 (two) times daily. 01/28/21  Yes Allred, Jeneen Rinks, MD  sulfamethoxazole-trimethoprim (BACTRIM DS) 800-160 MG tablet Take 1 tablet by mouth 2 (two) times daily.   Yes [provider]  tamsulosin (FLOMAX) 0.4 MG CAPS capsule Take 1 capsule (0.4 mg total) by mouth daily. 09/24/20  Yes McGowan, Larene Beach A, PA-C  docusate sodium (COLACE) 100 MG capsule Take 1 capsule (100 mg total) by mouth 2 (two) times daily. Patient not taking: No sig reported 05/30/21 05/30/22  Thornton Park, MD  HYDROcodone-acetaminophen (NORCO/VICODIN) 5-325 MG tablet Take 1 tablet by mouth every 4 (four) hours as needed for moderate pain. Patient not taking: No sig reported 05/30/21 05/30/22  Thornton Park, MD  ondansetron (ZOFRAN) 4 MG tablet Take 1 tablet (4 mg total) by mouth every 8 (eight) hours as needed for nausea or  vomiting. Patient not taking: No sig reported 05/30/21   Thornton Park, MD     Positive ROS: All other systems have been reviewed and were otherwise negative with the exception of those mentioned in the HPI and as above.  Physical Exam: General: Alert, no acute distress Cardiovascular: Regular rate and rhythm, no murmurs rubs or gallops.  No pedal edema Respiratory: Clear to auscultation bilaterally, no wheezes rales or rhonchi. No cyanosis, no use of accessory musculature GI: No organomegaly, abdomen is soft and non-tender nondistended with positive bowel sounds. Skin: Skin intact, no lesions within the operative field. Neurologic: Sensation intact distally Psychiatric: Patient is competent for consent with normal mood and affect  MUSCULOSKELETAL: Right knee: Patient has a moderate effusion.  There is no erythema or ecchymosis.  Patient has serous drainage on bandages removed from the right knee.  There is a pinhole  in the medial portal incision with serous fluid is draining.  Assessment: Right Knee Abscess  Plan: Plan for Procedure(s): RIGHT KNEE MINOR INCISION AND DRAINAGE OF ABSCESS  I reviewed the details of the operation as well as the postoperative course.  I anticipate sending synovial fluid for gram stain and culture.  Patient will have arthroscopic irrigation of the right knee.  A preop history and physical performed at the bedside.  I marked the right knee according to hospital's correct site of surgery protocol.  I discussed the risks and benefits of surgery. The risks include but are not limited to persistent infection or drainage, bleeding , nerve or blood vessel injury, joint stiffness or loss of motion, persistent pain, weakness or instability  and the need for further surgery. Medical risks include but are not limited to DVT and pulmonary embolism, myocardial infarction, stroke, pneumonia, respiratory failure and death. Patient understood these risks and wished to  proceed.     Thornton Park, MD   06/20/2021 12:18 PM

## 2021-06-21 ENCOUNTER — Encounter: Payer: Self-pay | Admitting: Orthopedic Surgery

## 2021-06-21 DIAGNOSIS — T8142XA Infection following a procedure, deep incisional surgical site, initial encounter: Secondary | ICD-10-CM | POA: Diagnosis not present

## 2021-06-21 LAB — CREATININE, SERUM
Creatinine, Ser: 0.91 mg/dL (ref 0.61–1.24)
GFR, Estimated: >60 mL/min (ref >60)

## 2021-06-21 LAB — CBC
HCT: 39.3 % (ref 39.0–52.0)
Hemoglobin: 13.3 g/dL (ref 13.0–17.0)
MCH: 36.1 pg — ABNORMAL HIGH (ref 26.0–34.0)
MCHC: 33.8 g/dL (ref 30.0–36.0)
MCV: 106.8 fL — ABNORMAL HIGH (ref 80.0–100.0)
Platelets: 244 10*3/uL (ref 150–400)
RBC: 3.68 MIL/uL — ABNORMAL LOW (ref 4.22–5.81)
RDW: 11.9 % (ref 11.5–15.5)
WBC: 9.3 10*3/uL (ref 4.0–10.5)
nRBC: 0 % (ref 0.0–0.2)

## 2021-06-21 MED ORDER — CEPHALEXIN 500 MG PO CAPS: 500.0000 mg | ORAL_CAPSULE | Freq: Four times a day (QID) | ORAL | 1 refills | Status: DC

## 2021-06-21 MED ORDER — SULFAMETHOXAZOLE-TRIMETHOPRIM 800-160 MG PO TABS: 1.0000 | ORAL_TABLET | Freq: Two times a day (BID) | ORAL | 1 refills | Status: DC

## 2021-06-21 NOTE — Discharge Summary (Signed)
Physician Discharge Summary  Patient ID: WELDON NOURI MRN: [id number] DOB/AGE: [birth year] 70 y.o.  Admit date: 06/20/2021 Discharge date: 06/21/2021  Admission Diagnoses:  Right Knee Abscess <principal problem not specified>  Discharge Diagnoses:  Right Knee Abscess Active Problems:   Septic joint of right knee joint ([place]) s/p arthroscopic I&D of the right knee   Past Medical History:  Diagnosis Date   AF (atrial fibrillation) (HCC)    Back pain    lower   Basal cell carcinoma 11/01/2019   nasal tip, right ear concha   ED (erectile dysfunction)    ED (erectile dysfunction)    Enlarged prostate    Essential hypertension    HOH (hard of hearing)    Hypogonadism in male    Hypothyroidism    Paroxysmal A-fib ([place])    a. 07/2018 s/p PVI.    Surgeries: Procedure(s): RIGHT KNEE DIAGNOSTIC  ARTHROSCOPY on 06/20/2021   Consultants (if any):   Discharged Condition: Improved  Hospital Course: DETRELL UMSCHEID is an 70 y.o. male who was admitted 06/20/2021 with a diagnosis of  Right Knee Abscess <principal problem not specified> and went to the operating room on 06/20/2021 and underwent the above named procedures.    He was given perioperative antibiotics:  Anti-infectives (From admission, onward)    Start     Dose/Rate Route Frequency Ordered Stop   06/21/21 1600  vancomycin (VANCOREADY) IVPB 1250 mg/250 mL        1,250 mg 166.7 mL/hr over 90 Minutes Intravenous Every 24 hours 06/20/21 1513     06/21/21 0000  cephALEXin (KEFLEX) 500 MG capsule        500 mg Oral 4 times daily 06/21/21 1412     06/21/21 0000  sulfamethoxazole-trimethoprim (BACTRIM DS) 800-160 MG tablet        1 tablet Oral 2 times daily 06/21/21 1412     06/20/21 1600  piperacillin-tazobactam (ZOSYN) IVPB 3.375 g        3.375 g 12.5 mL/hr over 240 Minutes Intravenous Every 8 hours 06/20/21 1503     06/20/21 1600  vancomycin (VANCOREADY) IVPB 2000 mg/400 mL        2,000 mg 200 mL/hr over 120 Minutes  Intravenous  Once 06/20/21 1513 06/20/21 1842   06/20/21 1545  vancomycin (VANCOCIN) IVPB 1000 mg/200 mL premix  Status:  Discontinued        1,000 mg 200 mL/hr over 60 Minutes Intravenous Every 12 hours 06/20/21 1452 06/20/21 1512   06/20/21 1545  piperacillin-tazobactam (ZOSYN) IVPB 3.375 g  Status:  Discontinued        3.375 g 100 mL/hr over 30 Minutes Intravenous Every 8 hours 06/20/21 1452 06/20/21 1503   06/20/21 0943  ceFAZolin (ANCEF) 2-4 GM/100ML-% IVPB       Note to Pharmacy: [place] [facility], [place]: cabinet override      06/20/21 0943 06/20/21 1252   06/20/21 0600  ceFAZolin (ANCEF) IVPB 2g/100 mL premix        2 g 200 mL/hr over 30 Minutes Intravenous On call to O.R. 06/20/21 0309 06/20/21 1306     .  He was given sequential compression devices, early ambulation, and Eliquis for DVT prophylaxis.  He benefited maximally from the hospital stay and there were no complications.    Recent vital signs:  Vitals:   06/21/21 0435 06/21/21 0756  BP: (!) 141/76 136/76  Pulse: 70 69  Resp: 17 16  Temp: 98.1 F (36.7 C) 98.1 F (36.7 C)  SpO2: 100%  99%    Recent laboratory studies:  Lab Results  Component Value Date   HGB 13.3 06/21/2021   HGB 14.4 06/20/2021   HGB 14.1 05/07/2021   Lab Results  Component Value Date   WBC 9.3 06/21/2021   PLT 244 06/21/2021   Lab Results  Component Value Date   INR 0.97 10/29/2017   Lab Results  Component Value Date   NA 133 (L) 06/20/2021   K 4.5 06/20/2021   CL 102 06/20/2021   CO2 23 06/20/2021   BUN 11 06/20/2021   CREATININE 0.91 06/21/2021   GLUCOSE 115 (H) 06/20/2021    Discharge Medications:   Allergies as of 06/21/2021       Reactions   Statins Other (See Comments)   Patient declines any statins   Ciprofloxacin Rash        Medication List     STOP taking these medications    docusate sodium 100 MG capsule Commonly known as: Colace   ondansetron 4 MG tablet Commonly known as: Zofran       TAKE  these medications    acetaminophen 650 MG CR tablet Commonly known as: TYLENOL Take 650 mg by mouth as needed for pain.   cephALEXin 500 MG capsule Commonly known as: KEFLEX Take 1 capsule (500 mg total) by mouth 4 (four) times daily.   dutasteride 0.5 MG capsule Commonly known as: AVODART Take 1 capsule (0.5 mg total) by mouth daily.   Eliquis 5 MG Tabs tablet Generic drug: apixaban TAKE (1) TABLET BY MOUTH TWICE DAILY   HYDROcodone-acetaminophen 5-325 MG tablet Commonly known as: NORCO/VICODIN Take 1 tablet by mouth every 4 (four) hours as needed for moderate pain.   Ipratropium-Albuterol 20-100 MCG/ACT Aers respimat Commonly known as: COMBIVENT Inhale 1 puff into the lungs every 6 (six) hours as needed for wheezing or shortness of breath.   levothyroxine 25 MCG tablet Commonly known as: SYNTHROID Take 1 tablet (25 mcg total) by mouth daily before breakfast.   losartan 50 MG tablet Commonly known as: COZAAR Take 1 tablet (50 mg total) by mouth daily.   metoprolol tartrate 25 MG tablet Commonly known as: LOPRESSOR Take 0.5 tablets (12.5 mg total) by mouth 2 (two) times daily.   sulfamethoxazole-trimethoprim 800-160 MG tablet Commonly known as: BACTRIM DS Take 1 tablet by mouth 2 (two) times daily.   tamsulosin 0.4 MG Caps capsule Commonly known as: FLOMAX Take 1 capsule (0.4 mg total) by mouth daily.        Diagnostic Studies: Right knee synovial fluid cultures are negative to date  Disposition: Discharge disposition: 01-Home or Self Care       Discharge Instructions     Call MD / Call 911   Complete by: As directed    If you experience chest pain or shortness of breath, CALL 911 and be transported to the hospital emergency room.  If you develope a fever above 101 F, pus (white drainage) or increased drainage or redness at the wound, or calf pain, call your surgeon's office.   Constipation Prevention   Complete by: As directed    Drink plenty of  fluids.  Prune juice may be helpful.  You may use a stool softener, such as Colace (over the counter) 100 mg twice a day.  Use MiraLax (over the counter) for constipation as needed.   Diet general   Complete by: As directed    Discharge instructions   Complete by: As directed    Patient will continue  to elevate and use a Polar Care at home to reduce postoperative swelling.  He is weightbearing as tolerated in the right lower extremity.  Patient may remove the dressing in 3 days but will keep Steri-Strips in place over his incisions.  Patient is being discharged home with prescriptions for Bactrim and Keflex sent to the [facility] in [place].  Patient will follow up with Dr. Mack Guise at the [facility] office on Friday October 14th at 11:30AM.   Driving restrictions   Complete by: As directed    No driving until follow up in the office.   Increase activity slowly as tolerated   Complete by: As directed    Lifting restrictions   Complete by: As directed    No lifting for 6 weeks   Post-operative opioid taper instructions:   Complete by: As directed    POST-OPERATIVE OPIOID TAPER INSTRUCTIONS: It is important to wean off of your opioid medication as soon as possible. If you do not need pain medication after your surgery it is ok to stop day one. Opioids include: Codeine, Hydrocodone(Norco, Vicodin), Oxycodone(Percocet, oxycontin) and hydromorphone amongst others.  Long term and even short term use of opiods can cause: Increased pain response Dependence Constipation Depression Respiratory depression And more.  Withdrawal symptoms can include Flu like symptoms Nausea, vomiting And more Techniques to manage these symptoms Hydrate well Eat regular healthy meals Stay active Use relaxation techniques(deep breathing, meditating, yoga) Do Not substitute Alcohol to help with tapering If you have been on opioids for less than two weeks and do not have pain than it is ok to stop all  together.  Plan to wean off of opioids This plan should start within one week post op of your joint replacement. Maintain the same interval or time between taking each dose and first decrease the dose.  Cut the total daily intake of opioids by one tablet each day Next start to increase the time between doses. The last dose that should be eliminated is the evening dose.             Signed: Thornton Park ,MD 06/21/2021, 2:18 PM

## 2021-06-21 NOTE — Evaluation (Signed)
Occupational Therapy Evaluation Patient Details Name: Alexander Carey MRN: [id number] DOB: 06/30/1951 Today's Date: 06/21/2021   History of Present Illness Alexander Carey is a 70 y.o. male who presents for preoperative history and physical with a diagnosis of possible right knee postop infection.  Patient had a right knee arthroscopic partial medial and lateral [facility]ctom[facility] and 05/30/2021.  Patient had sutures removed approximately week ago but since that time has had persistent serous drainage from his medial incision.  He was started on oral antibiotics over the weekend but has had persistent serous drainage.  The decision was made to proceed with an arthroscopic washout of the right knee.  Patient has not experienced any fevers or chills.   Clinical Impression   Upon entering the room, pt supine in bed with wife and daughter in room.Pt reports being independent at baseline with self care and mobility. 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 Pt ambulates to bathroom and dons briefs, stands for hygiene and sink, and returns to bed without assistance. Pt does report 3/10 pain with mobility but otherwise no further concerns at this time. Pt with no skilled OT needs at this time.      Recommendations for follow up therapy are one component of a multi-disciplinary discharge planning process, led by the attending physician.  Recommendations may be updated based on patient status, additional functional criteria and insurance authorization.   Follow Up Recommendations  No OT follow up    Equipment Recommendations  None recommended by OT       Precautions / Restrictions Precautions Precautions: None Restrictions Weight Bearing Restrictions: Yes RLE Weight Bearing: Weight bearing as tolerated      Mobility Bed Mobility Overal bed mobility: Independent                   Transfers Overall transfer level: Independent Equipment used: Rolling walker (2 wheeled)                  Balance Overall balance assessment: Modified Independent                                         ADL either performed or assessed with clinical judgement   ADL Overall ADL's : Modified independent                                       General ADL Comments: Pt ambulates into bathroom with RW at mod I level. Pt sits for toileting need and dresses LB without assistance. Pt standing for hand hygiene and returns to bed without assistance.     Vision Baseline Vision/History: 1 Wears glasses Patient Visual Report: No change from baseline              Pertinent Vitals/Pain Pain Assessment: 0-10 Pain Score: 3  Pain Location: R knee Pain Descriptors / Indicators: Discomfort;Tightness;Sore Pain Intervention(s): Limited activity within patient's tolerance;Monitored during session;Premedicated before session;Repositioned     Hand Dominance Right   Extremity/Trunk Assessment Upper Extremity Assessment Upper Extremity Assessment: Overall WFL for tasks assessed   Lower Extremity Assessment Lower Extremity Assessment: Defer to PT evaluation RLE Sensation: WNL RLE Coordination: decreased gross motor   Cervical / Trunk Assessment Cervical /  Trunk Assessment: Normal   Communication Communication Communication: HOH   Cognition Arousal/Alertness: Awake/alert Behavior During Therapy: WFL for tasks assessed/performed Overall Cognitive Status: Within Functional Limits for tasks assessed                                                Home Living Family/patient expects to be discharged to:: Private residence Living Arrangements: Spouse/significant other Available Help at Discharge: Family;Available 24 hours/day Type of Home: House Home Access: Stairs to enter [employer] of Steps: 2   Home Layout: One  level               Home Equipment: Walker - 2 wheels;Cane - single point          Prior Functioning/Environment Level of Independence: Independent        Comments: independent prior                 OT Goals(Current goals can be found in the care plan section) Acute Rehab OT Goals Patient Stated Goal: to return home this pm OT Goal Formulation: With patient/family Time For Goal Achievement: 06/21/21 Potential to Achieve Goals: Good   AM-PAC OT "6 Clicks" Daily Activity     Outcome Measure Help from another person eating meals?: None Help from another person taking care of personal grooming?: None Help from another person toileting, which includes using toliet, bedpan, or urinal?: None Help from another person bathing (including washing, rinsing, drying)?: None Help from another person to put on and taking off regular upper body clothing?: None Help from another person to put on and taking off regular lower body clothing?: None 6 Click Score: 24   End of Session Equipment Utilized During Treatment: Rolling walker Nurse Communication: Mobility status  Activity Tolerance: Patient tolerated treatment well Patient left: in bed;with call bell/phone within reach                   Time: 1026-1040 OT Time Calculation (min): 14 min Charges:  OT General Charges $OT Visit: 1 Visit OT Evaluation $OT Eval Low Complexity: 1 Low OT Treatments $Self Care/Home Management : 8-22 mins Darleen Crocker, MS, OTR/L , CBIS ascom [phone]  06/21/21, 2:14 PM

## 2021-06-21 NOTE — Anesthesia Postprocedure Evaluation (Signed)
Anesthesia Post Note  Patient: MAYSEN SUDOL  Procedure(s) Performed: RIGHT KNEE DIAGNOSTIC  ARTHROSCOPY (Right: Knee)  Patient location during evaluation: PACU Anesthesia Type: General Level of consciousness: awake and alert Pain management: pain level controlled Vital Signs Assessment: post-procedure vital signs reviewed and stable Respiratory status: spontaneous breathing, nonlabored ventilation, respiratory function stable and patient connected to nasal cannula oxygen Cardiovascular status: blood pressure returned to baseline and stable Postop Assessment: no apparent nausea or vomiting Anesthetic complications: no   No notable events documented.   Last Vitals:  Vitals:   06/21/21 0435 06/21/21 0756  BP: (!) 141/76 136/76  Pulse: 70 69  Resp: 17 16  Temp: 36.7 C 36.7 C  SpO2: 100% 99%    Last Pain:  Vitals:   06/21/21 0756  TempSrc: Oral  PainSc:                  Martha Clan

## 2021-06-21 NOTE — Progress Notes (Signed)
  Subjective:  POD #1 s/p arthroscopic I&D of the right knee.   Patient reports right knee pain as mild.  Patient states his pain is much improved today.  Patient's wife is at the bedside.  Objective:   VITALS:   Vitals:   06/20/21 1941 06/21/21 0011 06/21/21 0435 06/21/21 0756  BP: 116/70 116/65 (!) 141/76 136/76  Pulse: 71 71 70 69  Resp: 17 17 17 16   Temp: 98 F (36.7 C) 98 F (36.7 C) 98.1 F (36.7 C) 98.1 F (36.7 C)  TempSrc: Oral  Oral Oral  SpO2: 99% 97% 100% 99%  Weight:      Height:        PHYSICAL EXAM: Right lower extremity Neurovascular intact Sensation intact distally Intact pulses distally Dorsiflexion/Plantar flexion intact Incision: dressing C/D/I No cellulitis present Compartment soft  LABS  Results for orders placed or performed during the hospital encounter of 06/20/21 (from the past 24 hour(s))  CBC     Status: Abnormal   Collection Time: 06/21/21  4:36 AM  Result Value Ref Range   WBC 9.3 4.0 - 10.5 K/uL   RBC 3.68 (L) 4.22 - 5.81 MIL/uL   Hemoglobin 13.3 13.0 - 17.0 g/dL   HCT 39.3 39.0 - 52.0 %   MCV 106.8 (H) 80.0 - 100.0 fL   MCH 36.1 (H) 26.0 - 34.0 pg   MCHC 33.8 30.0 - 36.0 g/dL   RDW 11.9 11.5 - 15.5 %   Platelets 244 150 - 400 K/uL   nRBC 0.0 0.0 - 0.2 %  Creatinine, serum     Status: None   Collection Time: 06/21/21  4:36 AM  Result Value Ref Range   Creatinine, Ser 0.91 0.61 - 1.24 mg/dL   GFR, Estimated >60 >60 mL/min    No results found.  Assessment/Plan: 1 Day Post-Op   Active Problems:   Septic joint of right knee joint [facility])  Patient doing well postop.  Patient has a white count of 9.3 today which is in the normal range.  Patient afebrile.  Vital signs are stable.  Preliminary report on cultures are negative from the OR.  Infectious disease consultation is not available today by Dr. Steva Ready.  Therefore the patient will be discharged today on oral Bactrim and Keflex.  He will follow-up in my office in 1 week but  will contact the office sooner if he has any worsening pain, swelling, redness or fevers.  Patient is wife understood and agreed this plan.    Thornton Park , MD 06/21/2021, 2:03 PM

## 2021-06-21 NOTE — Evaluation (Signed)
Physical Therapy Evaluation Patient Details Name: Alexander Carey MRN: [id number] DOB: 1951/09/04 Today's Date: 06/21/2021  History of Present Illness  Alexander Carey is a 70 y.o. male who presents for preoperative history and physical with a diagnosis of possible right knee postop infection.  Patient had a right knee arthroscopic partial medial and lateral [facility]iscectomies [facility] and 05/30/2021.  Patient had sutures removed approximately week ago but since that time has had persistent serous drainage from his medial incision.  He was started on oral antibiotics over the weekend but has had persistent serous drainage.  The decision was made to proceed with an arthroscopic washout of the right knee.  Patient has not experienced any fevers or chills.   Clinical Impression  Patient received in bed, he is agreeable to PT assessment. Patient is independent with bed mobility, transfers and ambulated with supervision 200 feet with RW and up.down 5 steps with B Rails and supervision. Reports significant improvement in R knee pain reporting 3/10 at this time. He does not require further PT at this time and will be safe to return home.        Recommendations for follow up therapy are one component of a multi-disciplinary discharge planning process, led by the attending physician.  Recommendations may be updated based on patient status, additional functional criteria and insurance authorization.  Follow Up Recommendations No PT follow up    Equipment Recommendations  None recommended by PT    Recommendations for Other Services       Precautions / Restrictions Precautions Precautions: None Restrictions Weight Bearing Restrictions: Yes RLE Weight Bearing: Weight bearing as tolerated      Mobility  Bed Mobility Overal bed mobility: Independent                  Transfers Overall transfer level: Independent                  Ambulation/Gait Ambulation/Gait  assistance: Modified independent (Device/Increase time) Gait Distance (Feet): 200 Feet Assistive device: Rolling walker (2 wheeled) Gait Pattern/deviations: Step-through pattern;Decreased weight shift to right Gait velocity: WNL   General Gait Details: safe with ambulation. Using walker for pain relief  Stairs Stairs: Yes Stairs assistance: Supervision Stair Management: Alternating pattern;Two rails Number of Stairs: 5 General stair comments: safe with steps  Wheelchair Mobility    Modified Rankin (Stroke Patients Only)       Balance Overall balance assessment: Modified Independent                                           Pertinent Vitals/Pain Pain Assessment: 0-10 Pain Score: 3  Pain Descriptors / Indicators: Discomfort;Tightness;Sore Pain Intervention(s): Limited activity within patient's tolerance;Monitored during session;Premedicated before session    [place] expects to be discharged to:: Private residence Living Arrangements: Spouse/significant other Available Help at Discharge: Family;Available 24 hours/day Type of Home: House Home Access: Stairs to enter   [employer] of Steps: 2 Home Layout: One level Home Equipment: Walker - 2 wheels;Cane - single point      Prior Function Level of Independence: Independent         Comments: independent prior     Hand Dominance        Extremity/Trunk Assessment   Upper Extremity Assessment Upper Extremity Assessment: Overall WFL for tasks assessed    Lower Extremity Assessment Lower  Extremity Assessment: RLE deficits/detail RLE Sensation: WNL RLE Coordination: decreased gross motor    Cervical / Trunk Assessment Cervical / Trunk Assessment: Normal  Communication   Communication: HOH  Cognition Arousal/Alertness: Awake/alert Behavior During Therapy: WFL for tasks assessed/performed Overall Cognitive Status: Within Functional Limits for tasks assessed                                         General Comments      Exercises     Assessment/Plan    PT Assessment Patent does not need any further PT services  PT Problem List Decreased mobility;Pain       PT Treatment Interventions      PT Goals (Current goals can be found in the Care Plan section)  Acute Rehab PT Goals Patient Stated Goal: to return home this pm PT Goal Formulation: With patient/family Time For Goal Achievement: 06/22/21 Potential to Achieve Goals: Good    Frequency     Barriers to discharge        Co-evaluation               AM-PAC PT "6 Clicks" Mobility  Outcome Measure Help needed turning from your back to your side while in a flat bed without using bedrails?: None Help needed moving from lying on your back to sitting on the side of a flat bed without using bedrails?: None Help needed moving to and from a bed to a chair (including a wheelchair)?: None Help needed standing up from a chair using your arms (e.g., wheelchair or bedside chair)?: None Help needed to walk in hospital room?: None Help needed climbing 3-5 steps with a railing? : None 6 Click Score: 24    End of Session Equipment Utilized During Treatment: Gait belt Activity Tolerance: Patient tolerated treatment well Patient left: in bed;with call bell/phone within reach;with family/visitor present Nurse Communication: Mobility status PT Visit Diagnosis: Pain Pain - Right/Left: Right Pain - part of body: Knee    Time: 0950-1005 PT Time Calculation (min) (ACUTE ONLY): 15 min   Charges:   PT Evaluation $PT Eval Low Complexity: 1 Low          Jilliann Subramanian, PT, GCS 06/21/21,10:14 AM

## 2021-06-24 DIAGNOSIS — M25462 Effusion, left knee: Secondary | ICD-10-CM | POA: Diagnosis not present

## 2021-06-25 ENCOUNTER — Inpatient Hospital Stay
Admission: AD | Admit: 2021-06-25 | Discharge: 2021-06-29 | DRG: 549 | Disposition: A | Discharge status: Home / Self Care | Payer: PPO | Source: Ambulatory Visit | Attending: Family Medicine | Admitting: Family Medicine

## 2021-06-25 DIAGNOSIS — I10 Essential (primary) hypertension: Secondary | ICD-10-CM | POA: Diagnosis present

## 2021-06-25 DIAGNOSIS — D6869 Other thrombophilia: Secondary | ICD-10-CM | POA: Diagnosis not present

## 2021-06-25 DIAGNOSIS — Z79890 Hormone replacement therapy (postmenopausal): Secondary | ICD-10-CM | POA: Diagnosis not present

## 2021-06-25 DIAGNOSIS — M009 Pyogenic arthritis, unspecified: Secondary | ICD-10-CM | POA: Diagnosis present

## 2021-06-25 DIAGNOSIS — E785 Hyperlipidemia, unspecified: Secondary | ICD-10-CM | POA: Diagnosis present

## 2021-06-25 DIAGNOSIS — Z7901 Long term (current) use of anticoagulants: Secondary | ICD-10-CM | POA: Diagnosis not present

## 2021-06-25 DIAGNOSIS — Z20822 Contact with and (suspected) exposure to covid-19: Secondary | ICD-10-CM | POA: Diagnosis present

## 2021-06-25 DIAGNOSIS — E039 Hypothyroidism, unspecified: Secondary | ICD-10-CM | POA: Diagnosis present

## 2021-06-25 DIAGNOSIS — M47816 Spondylosis without myelopathy or radiculopathy, lumbar region: Secondary | ICD-10-CM | POA: Diagnosis present

## 2021-06-25 DIAGNOSIS — N400 Enlarged prostate without lower urinary tract symptoms: Secondary | ICD-10-CM | POA: Diagnosis present

## 2021-06-25 DIAGNOSIS — D6859 Other primary thrombophilia: Secondary | ICD-10-CM | POA: Diagnosis present

## 2021-06-25 DIAGNOSIS — I480 Paroxysmal atrial fibrillation: Secondary | ICD-10-CM

## 2021-06-25 DIAGNOSIS — G4730 Sleep apnea, unspecified: Secondary | ICD-10-CM | POA: Diagnosis present

## 2021-06-25 DIAGNOSIS — Z85828 Personal history of other malignant neoplasm of skin: Secondary | ICD-10-CM | POA: Diagnosis not present

## 2021-06-25 DIAGNOSIS — T8144XA Sepsis following a procedure, initial encounter: Secondary | ICD-10-CM | POA: Diagnosis not present

## 2021-06-25 DIAGNOSIS — M75111 Incomplete rotator cuff tear or rupture of right shoulder, not specified as traumatic: Secondary | ICD-10-CM | POA: Diagnosis present

## 2021-06-25 DIAGNOSIS — Z87891 Personal history of nicotine dependence: Secondary | ICD-10-CM | POA: Diagnosis not present

## 2021-06-25 DIAGNOSIS — S82891A Other fracture of right lower leg, initial encounter for closed fracture: Secondary | ICD-10-CM | POA: Diagnosis not present

## 2021-06-25 DIAGNOSIS — M01X61 Direct infection of right knee in infectious and parasitic diseases classified elsewhere: Secondary | ICD-10-CM | POA: Diagnosis not present

## 2021-06-25 DIAGNOSIS — Z8041 Family history of malignant neoplasm of ovary: Secondary | ICD-10-CM

## 2021-06-25 DIAGNOSIS — Z79899 Other long term (current) drug therapy: Secondary | ICD-10-CM | POA: Diagnosis not present

## 2021-06-25 DIAGNOSIS — Z28310 Unvaccinated for covid-19: Secondary | ICD-10-CM

## 2021-06-25 DIAGNOSIS — M00861 Arthritis due to other bacteria, right knee: Secondary | ICD-10-CM | POA: Diagnosis not present

## 2021-06-25 DIAGNOSIS — Z888 Allergy status to other drugs, medicaments and biological substances status: Secondary | ICD-10-CM

## 2021-06-25 DIAGNOSIS — I4819 Other persistent atrial fibrillation: Secondary | ICD-10-CM | POA: Diagnosis present

## 2021-06-25 LAB — AEROBIC/ANAEROBIC CULTURE W GRAM STAIN (SURGICAL/DEEP WOUND)
Culture: NO GROWTH
Culture: NO GROWTH
Gram Stain: NONE SEEN
Gram Stain: NONE SEEN

## 2021-06-25 LAB — CBC WITH DIFFERENTIAL/PLATELET
Abs Immature Granulocytes: 0.04 10*3/uL (ref 0.00–0.07)
Basophils Absolute: 0 10*3/uL (ref 0.0–0.1)
Basophils Relative: 0 %
Eosinophils Absolute: 0.2 10*3/uL (ref 0.0–0.5)
Eosinophils Relative: 2 %
HCT: 34.4 % — ABNORMAL LOW (ref 39.0–52.0)
Hemoglobin: 12.6 g/dL — ABNORMAL LOW (ref 13.0–17.0)
Immature Granulocytes: 1 %
Lymphocytes Relative: 21 %
Lymphs Abs: 1.5 10*3/uL (ref 0.7–4.0)
MCH: 38.1 pg — ABNORMAL HIGH (ref 26.0–34.0)
MCHC: 36.6 g/dL — ABNORMAL HIGH (ref 30.0–36.0)
MCV: 103.9 fL — ABNORMAL HIGH (ref 80.0–100.0)
Monocytes Absolute: 0.8 10*3/uL (ref 0.1–1.0)
Monocytes Relative: 12 %
Neutro Abs: 4.5 10*3/uL (ref 1.7–7.7)
Neutrophils Relative %: 64 %
Platelets: 270 10*3/uL (ref 150–400)
RBC: 3.31 MIL/uL — ABNORMAL LOW (ref 4.22–5.81)
RDW: 11.8 % (ref 11.5–15.5)
WBC: 7.1 10*3/uL (ref 4.0–10.5)
nRBC: 0 % (ref 0.0–0.2)

## 2021-06-25 LAB — RESP PANEL BY RT-PCR (FLU A&B, COVID) ARPGX2
Influenza A by PCR: NEGATIVE
Influenza B by PCR: NEGATIVE
SARS Coronavirus 2 by RT PCR: NEGATIVE

## 2021-06-25 LAB — COMPREHENSIVE METABOLIC PANEL
ALT: 18 U/L (ref 0–44)
AST: 12 U/L — ABNORMAL LOW (ref 15–41)
Albumin: 3.2 g/dL — ABNORMAL LOW (ref 3.5–5.0)
Alkaline Phosphatase: 59 U/L (ref 38–126)
Anion gap: 8 (ref 5–15)
BUN: 13 mg/dL (ref 8–23)
CO2: 25 mmol/L (ref 22–32)
Calcium: 8.7 mg/dL — ABNORMAL LOW (ref 8.9–10.3)
Chloride: 99 mmol/L (ref 98–111)
Creatinine, Ser: 0.96 mg/dL (ref 0.61–1.24)
GFR, Estimated: >60 mL/min (ref >60)
Glucose, Bld: 103 mg/dL — ABNORMAL HIGH (ref 70–99)
Potassium: 4.2 mmol/L (ref 3.5–5.1)
Sodium: 132 mmol/L — ABNORMAL LOW (ref 135–145)
Total Bilirubin: 0.5 mg/dL (ref 0.3–1.2)
Total Protein: 6 g/dL — ABNORMAL LOW (ref 6.5–8.1)

## 2021-06-25 LAB — URINALYSIS, COMPLETE (UACMP) WITH MICROSCOPIC
Bacteria, UA: NONE SEEN
Bilirubin Urine: NEGATIVE
Glucose, UA: NEGATIVE mg/dL
Hgb urine dipstick: NEGATIVE
Ketones, ur: NEGATIVE mg/dL
Leukocytes,Ua: NEGATIVE
Nitrite: NEGATIVE
Protein, ur: NEGATIVE mg/dL
Specific Gravity, Urine: 1.013 (ref 1.005–1.030)
Squamous Epithelial / HPF: NONE SEEN (ref 0–5)
pH: 6 (ref 5.0–8.0)

## 2021-06-25 LAB — SEDIMENTATION RATE: Sed Rate: 70 mm/hr — ABNORMAL HIGH (ref 0–20)

## 2021-06-25 LAB — C-REACTIVE PROTEIN: CRP: 7.4 mg/dL — ABNORMAL HIGH (ref <1.0)

## 2021-06-25 MED ORDER — OXYCODONE HCL 5 MG PO TABS
5.0000 mg | ORAL_TABLET | Freq: Once | ORAL | Status: AC
  Administered 2021-06-25: 5 mg via ORAL
  Filled 2021-06-25: qty 1

## 2021-06-25 MED ORDER — ACETAMINOPHEN 650 MG RE SUPP: 650.0000 mg | Freq: Four times a day (QID) | RECTAL | Status: AC | PRN

## 2021-06-25 MED ORDER — LACTATED RINGERS IV SOLN: INTRAVENOUS | Status: DC

## 2021-06-25 MED ORDER — ORAL CARE MOUTH RINSE: 15.0000 mL | Freq: Once | OROMUCOSAL | Status: DC

## 2021-06-25 MED ORDER — LEVOTHYROXINE SODIUM 25 MCG PO TABS
25.0000 ug | ORAL_TABLET | Freq: Every day | ORAL | Status: DC
  Administered 2021-06-26 – 2021-06-29 (×4): 25 ug via ORAL
  Filled 2021-06-25 (×4): qty 1

## 2021-06-25 MED ORDER — ONDANSETRON HCL 4 MG PO TABS
4.0000 mg | ORAL_TABLET | Freq: Four times a day (QID) | ORAL | Status: DC | PRN
Start: 2021-06-25 — End: 2021-06-29

## 2021-06-25 MED ORDER — MORPHINE SULFATE (PF) 2 MG/ML IV SOLN
2.0000 mg | INTRAVENOUS | Status: AC | PRN
  Filled 2021-06-25: qty 1

## 2021-06-25 MED ORDER — SODIUM CHLORIDE 0.9 % IV SOLN
2.0000 g | Freq: Three times a day (TID) | INTRAVENOUS | Status: AC
  Administered 2021-06-25 – 2021-06-27 (×7): 2 g via INTRAVENOUS
  Filled 2021-06-25 (×11): qty 2

## 2021-06-25 MED ORDER — ACETAMINOPHEN 325 MG PO TABS: 650.0000 mg | ORAL_TABLET | Freq: Four times a day (QID) | ORAL | Status: AC | PRN

## 2021-06-25 MED ORDER — ONDANSETRON HCL 4 MG/2ML IJ SOLN
4.0000 mg | Freq: Four times a day (QID) | INTRAMUSCULAR | Status: DC | PRN
Start: 2021-06-25 — End: 2021-06-29

## 2021-06-25 MED ORDER — LOSARTAN POTASSIUM 50 MG PO TABS
50.0000 mg | ORAL_TABLET | Freq: Every day | ORAL | Status: DC
  Administered 2021-06-26 – 2021-06-29 (×4): 50 mg via ORAL
  Filled 2021-06-25 (×4): qty 1

## 2021-06-25 MED ORDER — ALBUTEROL SULFATE (2.5 MG/3ML) 0.083% IN NEBU: 2.5000 mg | INHALATION_SOLUTION | Freq: Four times a day (QID) | RESPIRATORY_TRACT | Status: DC | PRN

## 2021-06-25 MED ORDER — HEPARIN SODIUM (PORCINE) 5000 UNIT/ML IJ SOLN: 5000.0000 [IU] | Freq: Three times a day (TID) | INTRAMUSCULAR | Status: DC

## 2021-06-25 MED ORDER — VANCOMYCIN HCL 2000 MG/400ML IV SOLN
2000.0000 mg | Freq: Once | INTRAVENOUS | Status: AC
  Administered 2021-06-25: 2000 mg via INTRAVENOUS
  Filled 2021-06-25: qty 400

## 2021-06-25 MED ORDER — TAMSULOSIN HCL 0.4 MG PO CAPS
0.4000 mg | ORAL_CAPSULE | Freq: Every day | ORAL | Status: DC
  Administered 2021-06-26 – 2021-06-29 (×4): 0.4 mg via ORAL
  Filled 2021-06-25 (×4): qty 1

## 2021-06-25 MED ORDER — CHLORHEXIDINE GLUCONATE 0.12 % MT SOLN: 15.0000 mL | Freq: Once | OROMUCOSAL | Status: DC

## 2021-06-25 MED ORDER — ALBUTEROL SULFATE HFA 108 (90 BASE) MCG/ACT IN AERS: 1.0000 | INHALATION_SPRAY | Freq: Four times a day (QID) | RESPIRATORY_TRACT | Status: DC | PRN

## 2021-06-25 MED ORDER — DUTASTERIDE 0.5 MG PO CAPS
0.5000 mg | ORAL_CAPSULE | Freq: Every day | ORAL | Status: DC
  Administered 2021-06-26 – 2021-06-29 (×4): 0.5 mg via ORAL
  Filled 2021-06-25 (×5): qty 1

## 2021-06-25 MED ORDER — HEPARIN SODIUM (PORCINE) 5000 UNIT/ML IJ SOLN
5000.0000 [IU] | Freq: Three times a day (TID) | INTRAMUSCULAR | Status: AC
  Administered 2021-06-25 – 2021-06-26 (×2): 5000 [IU] via SUBCUTANEOUS
  Filled 2021-06-25 (×2): qty 1

## 2021-06-25 MED ORDER — HYDROCODONE-ACETAMINOPHEN 5-325 MG PO TABS
1.0000 | ORAL_TABLET | ORAL | Status: DC | PRN
  Administered 2021-06-25 – 2021-06-29 (×9): 1 via ORAL
  Filled 2021-06-25 (×9): qty 1

## 2021-06-25 MED ORDER — IPRATROPIUM-ALBUTEROL 20-100 MCG/ACT IN AERS: 1.0000 | INHALATION_SPRAY | Freq: Four times a day (QID) | RESPIRATORY_TRACT | Status: DC | PRN

## 2021-06-25 MED ORDER — METOPROLOL TARTRATE 25 MG PO TABS
12.5000 mg | ORAL_TABLET | Freq: Two times a day (BID) | ORAL | Status: DC
  Administered 2021-06-25 – 2021-06-29 (×8): 12.5 mg via ORAL
  Filled 2021-06-25 (×8): qty 1

## 2021-06-25 MED ORDER — IPRATROPIUM-ALBUTEROL 0.5-2.5 (3) MG/3ML IN SOLN: 3.0000 mL | Freq: Four times a day (QID) | RESPIRATORY_TRACT | Status: DC | PRN

## 2021-06-25 MED ORDER — SODIUM CHLORIDE 0.9 % IV SOLN: INTRAVENOUS | Status: DC | PRN

## 2021-06-25 MED ORDER — VANCOMYCIN HCL IN DEXTROSE 1-5 GM/200ML-% IV SOLN
1000.0000 mg | Freq: Two times a day (BID) | INTRAVENOUS | Status: DC
  Administered 2021-06-26: 1000 mg via INTRAVENOUS
  Filled 2021-06-25 (×2): qty 200

## 2021-06-25 NOTE — Consult Note (Signed)
Pharmacy Antibiotic Note  Alexander Carey is a 70 y.o. male with PMH of afib, basal cell carcinoma, HTN, and hypothyroidism who was admitted on 06/25/2021 with  wound infection .  Pharmacy has been consulted for vancomycin and cefepime dosing.  Afeb, WBC 7.1  Plan: Cefepime -Will start cefepime 2g IV q8h  Vancomycin -Will give vancomycin 2000mg  IV x1 LD -Will start vancomycin 1000mg  IV q12h    -Est AUC 405.2    -Css min 11.6    -Scr used 0.96    -Wt used IBW    -Ke 0.72 -Will obtain levels around 4th or 5th dose if continued   Will continue to monitor renal function and adjust dose as clinically indicated   Temp (24hrs), Avg:97.8 F (36.6 C), Min:97.8 F (36.6 C), Max:97.8 F (36.6 C)  Recent Labs  Lab 06/20/21 0934 06/21/21 0436  WBC 6.2 9.3  CREATININE 0.97 0.91    Estimated Creatinine Clearance: 91.1 mL/min (by C-G formula based on SCr of 0.91 mg/dL).    Allergies  Allergen Reactions   Statins Other (See Comments)    Patient declines any statins   Ciprofloxacin Rash    Antimicrobials this admission: Vancomycin 10/11 >>  Cefepime 10/11 >>    Thank you for allowing pharmacy to be a part of this patient's care.  Narda Rutherford, PharmD Pharmacy Resident  06/25/2021 4:25 PM

## 2021-06-25 NOTE — Consult Note (Signed)
ORTHOPAEDIC CONSULTATION  REQUESTING PHYSICIAN: Cox, Amy N, DO  Chief Complaint: Recurrent right knee swelling and pain  HPI: Alexander Carey is a 70 y.o. male was sent to the hospital as a direct admit after contacting my office earlier today with complaints of right knee pain and swelling.  Patient had an arthroscopic partial medial and lateral meniscectomies on 05/30/21.  Patient initially did well but began having drainage from his medial incision which progressed despite oral antibiotics including Bactrim and Keflex.  Patient was taken on 06/21/2021 for an arthroscopic washout here at [facility].  Synovial fluid from surgery was sent to the lab for gram stain culture cell count and crystals.  Patient had a cell count of approximately 45,000 with 95% neutrophils.  Gram stain showed no organisms and his cultures are negative to date.  Patient felt significant improvement on postop day 1 and was therefore discharged home on p.o. Bactrim and Keflex.  Patient was seen in my office yesterday.  He states that he initially did well after discharge from the hospital but after couple days developed right knee pain and a large effusion.  His effusion was aspirated yesterday in the office.  Approximately 90 to 100 cc of blood-tinged cloudy synovial fluid was aspirated from his right knee.  This fluid was sent to [facility] for gram stain, culture, cell count and crystals.  Patient contacted the office this morning stating that his right knee swelling had reaccumulated and his pain had returned.  I decided to directly admit the patient to [facility] for repeat arthroscopic washout of his right knee and IV antibiotics.  Patient states his pain is currently controlled.  Past Medical History:  Diagnosis Date   AF (atrial fibrillation) (HCC)    Back pain    lower   Basal cell carcinoma 11/01/2019   nasal tip, right ear concha   ED (erectile dysfunction)    ED (erectile  dysfunction)    Enlarged prostate    Essential hypertension    HOH (hard of hearing)    Hypogonadism in male    Hypothyroidism    Paroxysmal A-fib ([place])    a. 07/2018 s/p PVI.   Past Surgical History:  Procedure Laterality Date   ATRIAL FIBRILLATION ABLATION N/A 07/27/2018   Procedure: ATRIAL FIBRILLATION ABLATION;  Surgeon: Thompson Grayer, MD;  Location: [place] [facility];  Service: Cardiovascular;  Laterality: N/A;   CARDIOVERSION N/A 01/13/2018   Procedure: CARDIOVERSION;  Surgeon: Nelva Bush, MD;  Location: [facility];  Service: Cardiovascular;  Laterality: N/A;   CARDIOVERSION N/A 01/26/2018   Procedure: CARDIOVERSION;  Surgeon: Minna Merritts, MD;  Location: [place] [facility];  Service: Cardiovascular;  Laterality: N/A;   CARDIOVERSION N/A 03/01/2018   Procedure: CARDIOVERSION;  Surgeon: Minna Merritts, MD;  Location: [place] [facility];  Service: Cardiovascular;  Laterality: N/A;   CARDIOVERSION N/A 05/21/2018   Procedure: CARDIOVERSION;  Surgeon: Wellington Hampshire, MD;  Location: [facility];  Service: Cardiovascular;  Laterality: N/A;   COLONOSCOPY  04/2005   COLONOSCOPY WITH PROPOFOL N/A 03/28/2020   Procedure: COLONOSCOPY WITH PROPOFOL;  Surgeon: Robert Bellow, MD;  Location: [facility];  Service: Endoscopy;  Laterality: N/A;   KNEE ARTHROSCOPY Right 06/20/2021   Procedure: RIGHT KNEE DIAGNOSTIC  ARTHROSCOPY;  Surgeon: Thornton Park, MD;  Location: [facility];  Service: Orthopedics;  Laterality: Right;   KNEE ARTHROSCOPY WITH MEDIAL MENISECTOMY Right 05/30/2021   Procedure: KNEE ARTHROSCOPY WITH MEDIAL AND LATERAL MENISECTOMIES AND CHONDROPLASTY OF THE  PATELLA;  Surgeon: Thornton Park, MD;  Location: [place];  Service: Orthopedics;  Laterality: Right;   TONSILLECTOMY     TRIGGER FINGER RELEASE Left 12/03/2016   Procedure: 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;  Surgeon: Corky Mull, MD;  Location: [place];  Service:  Orthopedics;  Laterality: Left;   Social History   Socioeconomic History   Marital status: Married    Spouse name: Not on file   Number of children: Not on file   Years of education: Not on file   Highest education level: Not on file  Occupational History   Not on file  Tobacco Use   Smoking status: Former    Packs/day: 0.50    Types: Cigarettes    Quit date: 09/15/2008    Years since quitting: 12.7   Smokeless tobacco: Never  Vaping Use   Vaping Use: Never used  Substance and Sexual Activity   Alcohol use: Yes    Comment: on the weekend- has a few shots   Drug use: No   Sexual activity: Not on file  Other Topics Concern   Not on file  Social History Narrative   Lives in [place] with spouse   Owns rental property   Social Determinants of Health   Financial Resource Strain: Not on file  Food Insecurity: Not on file  Transportation Needs: Not on file  Physical Activity: Not on file  Stress: Not on file  Social Connections: Not on file   Family History  Problem Relation Age of Onset   Ovarian cancer Mother    Throat cancer Father    Prostate cancer Neg Hx    Kidney cancer Neg Hx    Allergies  Allergen Reactions   Statins Other (See Comments)    Patient declines any statins   Ciprofloxacin Rash   Prior to Admission medications   Medication Sig Start Date End Date Taking? Authorizing Provider  albuterol (VENTOLIN HFA) 108 (90 Base) MCG/ACT inhaler Inhale 1-2 puffs into the lungs every 6 (six) hours as needed for wheezing or shortness of breath.   Yes [provider]  cephALEXin (KEFLEX) 500 MG capsule Take 1 capsule (500 mg total) by mouth 4 (four) times daily. 06/21/21  Yes Thornton Park, MD  dutasteride (AVODART) 0.5 MG capsule Take 1 capsule (0.5 mg total) by mouth daily. 09/24/20  Yes McGowan, Shannon A, PA-C  ELIQUIS 5 MG TABS tablet TAKE (1) TABLET BY MOUTH TWICE DAILY 01/31/21  Yes Allred, Jeneen Rinks, MD  levothyroxine (SYNTHROID, LEVOTHROID) 25 MCG  tablet Take 1 tablet (25 mcg total) by mouth daily before breakfast. 06/15/18  Yes Deboraha Sprang, MD  losartan (COZAAR) 50 MG tablet Take 1 tablet (50 mg total) by mouth daily. 01/07/21  Yes Allred, Jeneen Rinks, MD  metoprolol tartrate (LOPRESSOR) 25 MG tablet Take 0.5 tablets (12.5 mg total) by mouth 2 (two) times daily. 01/28/21  Yes Allred, Jeneen Rinks, MD  sulfamethoxazole-trimethoprim (BACTRIM DS) 800-160 MG tablet Take 1 tablet by mouth 2 (two) times daily. 06/21/21  Yes Thornton Park, MD  tamsulosin (FLOMAX) 0.4 MG CAPS capsule Take 1 capsule (0.4 mg total) by mouth daily. 09/24/20  Yes McGowan, Larene Beach A, PA-C  acetaminophen (TYLENOL) 650 MG CR tablet Take 650 mg by mouth as needed for pain.     [provider]  HYDROcodone-acetaminophen (NORCO/VICODIN) 5-325 MG tablet Take 1 tablet by mouth every 4 (four) hours as needed for moderate pain. Patient not taking: No sig reported  05/30/21 05/30/22  Thornton Park, MD  Ipratropium-Albuterol (COMBIVENT) 20-100 MCG/ACT AERS respimat Inhale 1 puff into the lungs every 6 (six) hours as needed for wheezing or shortness of breath.  10/13/17   [provider]   No results found.  Positive ROS: All other systems have been reviewed and were otherwise negative with the exception of those mentioned in the HPI and as above.  Physical Exam: General: Alert, no acute distress  MUSCULOSKELETAL: Right knee: Patient's arthroscopic incisions remain closed with nylon suture in place.  Patient has a large knee effusion without significant erythema or ecchymosis.  He does not have significant leg edema.  He has palpable pedal pulses, intact station light touch and intact motor function distally.  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  I explained to him that tomorrow's plan will include a repeat arthroscopy of his right knee.  I intend to leave a  drain in place following the arthroscopy to allow for evacuation of any accumulating fluid.  Cultures will be sent again from the OR.  Patient will remain in the hospital postop for IV antibiotics.  Appreciate the hospitalist service for admitting the patient and appreciate Dr. Blane Ohara input from an infectious disease standpoint.  Patient will likely require a prolonged course of IV antibiotics to eradicate his infection.      Thornton Park, MD    06/25/2021 8:34 PM

## 2021-06-25 NOTE — H&P (Addendum)
History and Physical   Alexander Carey UXN:[id number] DOB: [birth year] DOA: 06/25/2021  PCP: Valera Castle, MD  Outpatient Specialists: Dr. Mack Guise, orthopedic service Patient coming from: Home via [place]  I have personally briefly reviewed patient's old medical records in [place].  Chief Concern: Right knee infection  HPI: Alexander Carey is a 70 y.o. male with medical history significant for hypothyroid, paroxysmal atrial fibrillation on Eliquis, hyperlipidemia, hypertension, history of right medial and lateral meniscus repair on 2/54/2706, complicated by right knee abscess status post arthroscopic I&D of the right knee on 06/20/2021 who presents as a direct admission at the request of the orthopedic service for recurrent right knee infection.  He reports he developed knee pain and denies known trauma to the right knee. He states it just started hurting. He is otherwise his baseline self except for the right joint pain and swelling.  He endorses loose stool that is brown, once per day since starting oral antibiotics.  He denies chest pain, shortness of breath, abdominal pain, dysuria, hematuria, fever, vision changes.  He reports his last PO of Eliquis was AM on day of presentation, 10/11.   Summarization of surgical intervention prior to admission:  05/30/2021: Outpatient knee arthroscopy with medial and lateral meniscectomies and chondroplasty of the patella  Orthopedic service direct admission on 06/20/2021 to 06/21/2021:  Patient received cefazolin 2 g IV, 1 dose of vancomycin 2 g, Zosyn IV, a total of 3 doses were received prior to discharge. - Per progress note on 06/21/2021: Patient was discharged on oral Bactrim and Keflex with a repeat follow-up in 1 week outpatient with orthopedic service  Social history: He lives at home with his wife. He is a former tobacco user. He infrequently uses ETOH and last drink was evening of 06/24/21 and it was one shot of whiskey. He  denies recreational drug use. He is semi-retired, running rental properties.   Vaccination history: He is not vaccinated for covid 19.  ROS: Constitutional: no fever ENT/Mouth: no sore throat, no rhinorrhea Eyes: no eye pain, no vision changes Cardiovascular: no chest pain, no dyspnea,  no edema, no palpitations Respiratory: no cough, no sputum, no wheezing Gastrointestinal: no nausea, no vomiting, no diarrhea, no constipation Genitourinary: no urinary incontinence, no dysuria, no hematuria Musculoskeletal: + arthralgias of the right knee, + right knee swelling, no myalgias Skin: + skin lesions, no pruritus Neuro: + weakness, no loss of consciousness, no syncope Psych: no anxiety, no depression, + decrease appetite Heme/Lymph: no bruising, no bleeding  Assessment/Plan  Principal Problem:   Infection of right knee (HCC) Active Problems:   Dyslipidemia   Degenerative arthritis of lumbar spine   Incomplete tear of right rotator cuff   Sleep apnea   Septic joint of right knee joint (HCC)   AF (paroxysmal atrial fibrillation) (HCC)   On continuous oral anticoagulation   # Right knee infection - CMP, CBC with differentials, sed rate, CRP - Broad-spectrum antibiotic with cefepime and vancomycin per pharmacy for wound infection - Orthopedic surgeon, Dr. Mack Guise is aware of the patient and states he will take the patient for arthroscopic irrigation tomorrow, 06/26/2021 - Blood cultures x2 - Orthopedic service requested infectious disease specialist involvement, Dr. Ola Spurr has been consulted and will see the patient - N.p.o. after midnight - Direct admission to MedSurg, observation, telemetry  # Right knee pain - Pain management with hydrocortisone-acetaminophen, 5-325 mg tablet, p.o., every 4 hours as needed for moderate pain - Morphine 2 mg IV every 3  hours as needed for severe pain, 1 day ordered  # Paroxysmal atrial fibrillation-patient takes Eliquis 5 mg p.o. twice daily,  Eliquis has been held at the request of orthopedic surgeon - Per patient, his last dose of Eliquis was a.m. on day of admission  # Hypothyroid-levothyroxine 25 mcg daily first dose starting on 06/26/2021  # Hypertension-resumed losartan 50 mg daily, metoprolol tartrate 12.5 mg p.o. twice daily  # BPH-resume dutasteride 0.5 mg daily, tamsulosin 0.4 mg daily  Resumed home inhaler  A.m. labs: BMP, CBC without differentials  Chart reviewed.   05/30/2021: Outpatient knee arthroscopy with medial and lateral meniscectomies and chondroplasty of the patella  Orthopedic service direct admission on 06/20/2021 to 06/21/2021:  Patient received cefazolin 2 g IV, 1 dose of vancomycin 2 g, Zosyn IV, a total of 3 doses were received prior to discharge. - Per progress note on 06/21/2021: Patient was discharged on oral Bactrim and Keflex with a repeat follow-up in 1 week outpatient with orthopedic service  DVT prophylaxis: Heparin 5000 units subcutaneous every 8 hours Code Status: full code   Diet: Heart healthy at this time, n.p.o. after midnight Family Communication: Updated spouse, Mrs. Margarito Dehaas at bedside, with recorder on Disposition Plan: Pending clinical course Consults called: Orthopedic service Admission status: MedSurg, observation, telemetry  Past Medical History:  Diagnosis Date   AF (atrial fibrillation) ([place])    Back pain    lower   Basal cell carcinoma 11/01/2019   nasal tip, right ear concha   ED (erectile dysfunction)    ED (erectile dysfunction)    Enlarged prostate    Essential hypertension    HOH (hard of hearing)    Hypogonadism in male    Hypothyroidism    Paroxysmal A-fib ([place])    a. 07/2018 s/p PVI.   Past Surgical History:  Procedure Laterality Date   ATRIAL FIBRILLATION ABLATION N/A 07/27/2018   Procedure: ATRIAL FIBRILLATION ABLATION;  Surgeon: Thompson Grayer, MD;  Location: [place] [facility];  Service: Cardiovascular;  Laterality: N/A;   CARDIOVERSION N/A  01/13/2018   Procedure: CARDIOVERSION;  Surgeon: Nelva Bush, MD;  Location: [facility];  Service: Cardiovascular;  Laterality: N/A;   CARDIOVERSION N/A 01/26/2018   Procedure: CARDIOVERSION;  Surgeon: Minna Merritts, MD;  Location: [place] [facility];  Service: Cardiovascular;  Laterality: N/A;   CARDIOVERSION N/A 03/01/2018   Procedure: CARDIOVERSION;  Surgeon: Minna Merritts, MD;  Location: [place] [facility];  Service: Cardiovascular;  Laterality: N/A;   CARDIOVERSION N/A 05/21/2018   Procedure: CARDIOVERSION;  Surgeon: Wellington Hampshire, MD;  Location: [facility];  Service: Cardiovascular;  Laterality: N/A;   COLONOSCOPY  04/2005   COLONOSCOPY WITH PROPOFOL N/A 03/28/2020   Procedure: COLONOSCOPY WITH PROPOFOL;  Surgeon: Robert Bellow, MD;  Location: [facility];  Service: Endoscopy;  Laterality: N/A;   KNEE ARTHROSCOPY Right 06/20/2021   Procedure: RIGHT KNEE DIAGNOSTIC  ARTHROSCOPY;  Surgeon: Thornton Park, MD;  Location: [facility];  Service: Orthopedics;  Laterality: Right;   KNEE ARTHROSCOPY WITH MEDIAL MENISECTOMY Right 05/30/2021   Procedure: KNEE ARTHROSCOPY WITH MEDIAL AND LATERAL MENISECTOMIES AND CHONDROPLASTY OF THE PATELLA;  Surgeon: Thornton Park, MD;  Location: [place];  Service: Orthopedics;  Laterality: Right;   TONSILLECTOMY     TRIGGER FINGER RELEASE Left 12/03/2016   Procedure: 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 His smoking use included cigarettes. He smoked an average of .5 packs per day. He has never used smokeless tobacco. He reports current alcohol use. He reports that he does not use drugs.  Allergies  Allergen Reactions   Statins Other (See Comments)    Patient declines any statins   Ciprofloxacin Rash   Family History  Problem Relation Age of Onset    Ovarian cancer Mother    Throat cancer Father    Prostate cancer Neg Hx    Kidney cancer Neg Hx    Family history: Family history reviewed and not pertinent  Prior to Admission medications   Medication Sig Start Date End Date Taking? Authorizing Provider  acetaminophen (TYLENOL) 650 MG CR tablet Take 650 mg by mouth as needed for pain.     [provider]  cephALEXin (KEFLEX) 500 MG capsule Take 1 capsule (500 mg total) by mouth 4 (four) times daily. 06/21/21   Thornton Park, MD  dutasteride (AVODART) 0.5 MG capsule Take 1 capsule (0.5 mg total) by mouth daily. 09/24/20   McGowan, Larene Beach A, PA-C  ELIQUIS 5 MG TABS tablet TAKE (1) TABLET BY MOUTH TWICE DAILY 01/31/21   Allred, Jeneen Rinks, MD  HYDROcodone-acetaminophen (NORCO/VICODIN) 5-325 MG tablet Take 1 tablet by mouth every 4 (four) hours as needed for moderate pain. Patient not taking: No sig reported 05/30/21 05/30/22  Thornton Park, MD  Ipratropium-Albuterol (COMBIVENT) 20-100 MCG/ACT AERS respimat Inhale 1 puff into the lungs every 6 (six) hours as needed for wheezing or shortness of breath.  10/13/17   [provider]  levothyroxine (SYNTHROID, LEVOTHROID) 25 MCG tablet Take 1 tablet (25 mcg total) by mouth daily before breakfast. 06/15/18   Deboraha Sprang, MD  losartan (COZAAR) 50 MG tablet Take 1 tablet (50 mg total) by mouth daily. 01/07/21   Allred, Jeneen Rinks, MD  metoprolol tartrate (LOPRESSOR) 25 MG tablet Take 0.5 tablets (12.5 mg total) by mouth 2 (two) times daily. 01/28/21   Allred, Jeneen Rinks, MD  sulfamethoxazole-trimethoprim (BACTRIM DS) 800-160 MG tablet Take 1 tablet by mouth 2 (two) times daily. 06/21/21   Thornton Park, MD  tamsulosin (FLOMAX) 0.4 MG CAPS capsule Take 1 capsule (0.4 mg total) by mouth daily. 09/24/20   Nori Riis, PA-C   Physical Exam: Vitals:   06/25/21 1534  BP: 105/70  Pulse: 80  Resp: 20  Temp: 97.8 F (36.6 C)  SpO2: 99%   Constitutional: appears age appropriate, NAD, calm,  comfortable Eyes: PERRL, lids and conjunctivae normal ENMT: Mucous membranes are moist. Posterior pharynx clear of any exudate or lesions. Age-appropriate dentition. Hearing appropriate Neck: normal, supple, no masses, no thyromegaly Respiratory: clear to auscultation bilaterally, no wheezing, no crackles. Normal respiratory effort. No accessory muscle use.  Cardiovascular: Regular rate and rhythm, no murmurs / rubs / gallops. No extremity edema. 2+ pedal pulses. No carotid bruits.  Abdomen: obese abdomen, no tenderness, no masses palpated, no hepatosplenomegaly. Bowel sounds positive.  Musculoskeletal: no clubbing / cyanosis. No joint deformity upper and lower extremities. Good ROM, no contractures, no atrophy. Normal muscle tone.  Skin: + right knee effusion and swelling.     Neurologic: Sensation intact. Strength 5/5 in all 4.  Psychiatric: Normal judgment and insight. Alert and oriented x 3. Normal mood.   EKG: independently reviewed, showing SR 74, qtc 439 Chest x-ray on Admission: not indicated at this time Labs on Admission: I have personally reviewed following labs  CBC: Recent Labs  Lab  06/20/21 0934 06/21/21 0436 06/25/21 1614  WBC 6.2 9.3 7.1  NEUTROABS 4.2  --  4.5  HGB 14.4 13.3 12.6*  HCT 41.0 39.3 34.4*  MCV 104.3* 106.8* 103.9*  PLT 226 244 665   Basic Metabolic Panel: Recent Labs  Lab 06/20/21 0934 06/21/21 0436  NA 133*  --   K 4.5  --   CL 102  --   CO2 23  --   GLUCOSE 115*  --   BUN 11  --   CREATININE 0.97 0.91  CALCIUM 8.8*  --    GFR: Estimated Creatinine Clearance: 91.1 mL/min (by C-G formula based on SCr of 0.91 mg/dL).  Urine analysis:    Component Value Date/Time   COLORURINE YELLOW 09/24/2020 0909   APPEARANCEUR CLEAR 09/24/2020 0909   APPEARANCEUR Clear 03/13/2020 1037   LABSPEC 1.015 09/24/2020 0909   PHURINE 7.5 09/24/2020 0909   GLUCOSEU NEGATIVE 09/24/2020 0909   HGBUR NEGATIVE 09/24/2020 0909   BILIRUBINUR NEGATIVE  09/24/2020 0909   BILIRUBINUR Negative 03/13/2020 1037   KETONESUR NEGATIVE 09/24/2020 0909   PROTEINUR NEGATIVE 09/24/2020 0909   NITRITE NEGATIVE 09/24/2020 [place] 09/24/2020 0909   Dr. Tobie Poet [facility]  If 7PM-7AM, please contact overnight-coverage provider If 7AM-7PM, please contact day coverage provider [email or web address]  06/25/2021, 4:43 PM

## 2021-06-25 NOTE — Consult Note (Signed)
Infectious Disease     Reason for Consult:Knee infection    Referring Physician: Dr Tobie Poet Date of Admission:  06/25/2021   Principal Problem:   Infection of right knee [facility]) Active Problems:   Dyslipidemia   Degenerative arthritis of lumbar spine   Incomplete tear of right rotator cuff   Sleep apnea   Septic joint of right knee joint (HCC)   AF (paroxysmal atrial fibrillation) ([place])   On continuous oral anticoagulation   HPI: Alexander Carey is a 70 y.o. male who underwent R knee partial medial and lateral  meniscectomies at the medicine surgery center on 05/30/2021.  Patient had sutures removed but then developed persistent serous drainage from his medial incision.  He was started on oral antibiotics as an outpt and then underwent on 10/7 arthroscopic irrigation and debridement. Findings of  cloudy serous fluid was noted and cultured.  Cell count was 44k with 97% PMNS. ESR was 34, crp 7.7. Gram stain was negative and culture remains  negative. He was Dcd on kefelx and bactrim which he was on prior to admission. He reported doing well for a day or two but then noting increasing redness, pain, and swelling. He had repeat aspiration yesterday with 70 cc removed per pt. Readmitted for repeat surgery. WBC nml ESR is 70.  Past Medical History:  Diagnosis Date   AF (atrial fibrillation) (HCC)    Back pain    lower   Basal cell carcinoma 11/01/2019   nasal tip, right ear concha   ED (erectile dysfunction)    ED (erectile dysfunction)    Enlarged prostate    Essential hypertension    HOH (hard of hearing)    Hypogonadism in male    Hypothyroidism    Paroxysmal A-fib ([place])    a. 07/2018 s/p PVI.   Past Surgical History:  Procedure Laterality Date   ATRIAL FIBRILLATION ABLATION N/A 07/27/2018   Procedure: ATRIAL FIBRILLATION ABLATION;  Surgeon: Thompson Grayer, MD;  Location: [place] [facility];  Service: Cardiovascular;  Laterality: N/A;   CARDIOVERSION N/A 01/13/2018   Procedure:  CARDIOVERSION;  Surgeon: Nelva Bush, MD;  Location: [facility];  Service: Cardiovascular;  Laterality: N/A;   CARDIOVERSION N/A 01/26/2018   Procedure: CARDIOVERSION;  Surgeon: Minna Merritts, MD;  Location: [facility] [facility];  Service: Cardiovascular;  Laterality: N/A;   CARDIOVERSION N/A 03/01/2018   Procedure: CARDIOVERSION;  Surgeon: Minna Merritts, MD;  Location: [place] [facility];  Service: Cardiovascular;  Laterality: N/A;   CARDIOVERSION N/A 05/21/2018   Procedure: CARDIOVERSION;  Surgeon: Wellington Hampshire, MD;  Location: [facility];  Service: Cardiovascular;  Laterality: N/A;   COLONOSCOPY  04/2005   COLONOSCOPY WITH PROPOFOL N/A 03/28/2020   Procedure: COLONOSCOPY WITH PROPOFOL;  Surgeon: Robert Bellow, MD;  Location: [facility];  Service: Endoscopy;  Laterality: N/A;   KNEE ARTHROSCOPY Right 06/20/2021   Procedure: RIGHT KNEE DIAGNOSTIC  ARTHROSCOPY;  Surgeon: Thornton Park, MD;  Location: [facility];  Service: Orthopedics;  Laterality: Right;   KNEE ARTHROSCOPY WITH MEDIAL MENISECTOMY Right 05/30/2021   Procedure: KNEE ARTHROSCOPY WITH MEDIAL AND LATERAL MENISECTOMIES AND CHONDROPLASTY OF THE PATELLA;  Surgeon: Thornton Park, MD;  Location: [place];  Service: Orthopedics;  Laterality: Right;   TONSILLECTOMY     TRIGGER FINGER RELEASE Left 12/03/2016   Procedure: 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;  Surgeon: Corky Mull, MD;  Location: [place];  Service: Orthopedics;  Laterality: Left;   Social History  Tobacco Use   Smoking status: Former    Packs/day: 0.50    Types: Cigarettes    Quit date: 09/15/2008    Years since quitting: 12.7   Smokeless tobacco: Never  Vaping Use   Vaping Use: Never used  Substance Use Topics   Alcohol use: Yes    Comment: on the weekend- has a few shots   Drug use: No   Family History  Problem Relation Age of Onset   Ovarian cancer Mother    Throat cancer Father    Prostate cancer Neg Hx     Kidney cancer Neg Hx     Allergies:  Allergies  Allergen Reactions   Statins Other (See Comments)    Patient declines any statins   Ciprofloxacin Rash    Current antibiotics: Antibiotics Given (last 72 hours)     None       MEDICATIONS:  heparin  5,000 Units Subcutaneous Q8H   [START ON 06/26/2021] levothyroxine  25 mcg Oral Q0600   oxyCODONE  5 mg Oral Once    Review of Systems - 11 systems reviewed and negative per HPI   OBJECTIVE: Temp:  [97.8 F (36.6 C)] 97.8 F (36.6 C) (10/11 1534) Pulse Rate:  [80] 80 (10/11 1534) Resp:  [20] 20 (10/11 1534) BP: (105)/(70) 105/70 (10/11 1534) SpO2:  [99 %] 99 % (10/11 1534) Physical Exam  Constitutional: He is oriented to person, place, and time. He appears well-developed and well-nourished. No distress.  HENT:  Mouth/Throat: Oropharynx is clear and moist. No oropharyngeal exudate.  Cardiovascular: Normal rate, regular rhythm and normal heart sounds. Exam reveals no gallop and no friction rub.  No murmur heard.  Pulmonary/Chest: Effort normal and breath sounds normal. No respiratory distress. He has no wheezes.  Abdominal: Soft. Bowel sounds are normal. He exhibits no distension. There is no tenderness. Ext R knee swollen, red and warm. Limited rom  Lymphadenopathy: He has no cervical adenopathy.  Neurological: He is alert and oriented to person, place, and time.  Skin: Skin is warm and dry. No rash noted. No erythema.  Psychiatric: He has a normal mood and affect. His behavior is normal.     LABS: No results found for this or any previous visit (from the past 48 hour(s)). No components found for: ESR, C REACTIVE PROTEIN MICRO: Recent Results (from the past 720 hour(s))  Aerobic/Anaerobic Culture w Gram Stain (surgical/deep wound)     Status: None   Collection Time: 06/20/21 12:49 PM   Specimen: Synovial, Right Knee; Body Fluid  Result Value Ref Range Status   Specimen Description   Final    WOUND Performed at  [facility], [address] [place], [place] [address]    Special Requests   Final    RIGHT KNEEE WOUND Performed at [facility], [address] [place], [place] [address]    Gram Stain   Final    NO SQUAMOUS EPITHELIAL CELLS SEEN FEW WBC SEEN NO ORGANISMS SEEN    Culture   Final    No growth aerobically or anaerobically. Performed at [facility], [place] [address], [place], [place] [address]    Report Status 06/25/2021 FINAL  Final  Fungus Culture With Stain     Status: None (Preliminary result)   Collection Time: 06/20/21 12:51 PM   Specimen: Synovial, Right Knee; Body Fluid  Result Value Ref Range Status   Fungus Stain Final report  Final    Comment: (NOTE) Performed At: [facility]  [place], [place] [id number] Rush Farmer MD [id number]    Fungus (Mycology) Culture PENDING  Incomplete   Fungal Source FLUID  Final    Comment: Performed at [facility], [address] [place], [place] [address]  Aerobic/Anaerobic Culture w Gram Stain (surgical/deep wound)     Status: None   Collection Time: 06/20/21 12:51 PM   Specimen: Other Source; Wound  Result Value Ref Range Status   Specimen Description   Final    FLUID Performed at [facility], [address] [place], [place] [address]    Special Requests   Final    RIGHT KNEE SYNOVIAL FLUID Performed at [facility], [address] [place], [place] [address]    Gram Stain   Final    NO SQUAMOUS EPITHELIAL CELLS SEEN MODERATE WBC SEEN NO ORGANISMS SEEN    Culture   Final    No growth aerobically or anaerobically. Performed at [facility], [place] [address] [place], [place] [address]    Report Status 06/25/2021 FINAL  Final  Fungus Culture Result     Status: None   Collection Time: 06/20/21 12:51 PM  Result Value Ref Range Status   Result 1 Comment  Final    Comment: (NOTE) KOH/Calcofluor preparation:  no fungus  observed. Performed At: [facility] [place], [place] [id number] Rush Farmer MD [id number]     IMAGING: No results found.  Assessment:   Alexander Carey is a 70 y.o. male with recurrent R knee infection post op despite recent wash out and oral keflex and bactrim since prior to last surgery. WBC on fluid was 44k and 97% Pmns. Cxs neg but was on oral abx at time.  ESR is now quite high.   Recommendations Repeat washout planned.  Will likely need IV abx for 2 weeks. I suspect his culture will be negative again tomorrow. There is also a culture pending per pt from 10/10 done as otpt and will need to fu on that. Cont vanco and cefepime.  Thank you very much for allowing me to participate in the care of this patient. Please call with questions.   Cheral Marker. Ola Spurr, MD

## 2021-06-25 NOTE — Plan of Care (Signed)

## 2021-06-25 NOTE — Progress Notes (Signed)
Pt arrived via POV with wife as a direct admit from Dr. Harden Mo office for a scheduled I&D procedure tomorrow afternoon.  Upon arrival pt reported 7/10 pain to R knee.  Pain meds were ordered and given accordingly.  IV was placed by Shona Needles, RN. And IV/ABT was started.  VSS. Wife is at bedside.

## 2021-06-26 ENCOUNTER — Observation Stay: Payer: PPO | Admitting: Anesthesiology

## 2021-06-26 ENCOUNTER — Encounter: Payer: Self-pay | Admitting: Internal Medicine

## 2021-06-26 ENCOUNTER — Encounter
Admission: AD | Disposition: A | Discharge status: Home / Self Care | Payer: Self-pay | Source: Ambulatory Visit | Attending: Family Medicine

## 2021-06-26 ENCOUNTER — Inpatient Hospital Stay: Admission: RE | Admit: 2021-06-26 | Payer: PPO | Source: Home / Self Care | Admitting: Orthopedic Surgery

## 2021-06-26 ENCOUNTER — Other Ambulatory Visit: Payer: Self-pay

## 2021-06-26 DIAGNOSIS — Z79899 Other long term (current) drug therapy: Secondary | ICD-10-CM | POA: Diagnosis not present

## 2021-06-26 DIAGNOSIS — M009 Pyogenic arthritis, unspecified: Secondary | ICD-10-CM | POA: Diagnosis present

## 2021-06-26 DIAGNOSIS — Z888 Allergy status to other drugs, medicaments and biological substances status: Secondary | ICD-10-CM | POA: Diagnosis not present

## 2021-06-26 DIAGNOSIS — Z28310 Unvaccinated for covid-19: Secondary | ICD-10-CM | POA: Diagnosis not present

## 2021-06-26 DIAGNOSIS — Z20822 Contact with and (suspected) exposure to covid-19: Secondary | ICD-10-CM | POA: Diagnosis present

## 2021-06-26 DIAGNOSIS — E039 Hypothyroidism, unspecified: Secondary | ICD-10-CM

## 2021-06-26 DIAGNOSIS — M47816 Spondylosis without myelopathy or radiculopathy, lumbar region: Secondary | ICD-10-CM | POA: Diagnosis present

## 2021-06-26 DIAGNOSIS — I10 Essential (primary) hypertension: Secondary | ICD-10-CM | POA: Diagnosis present

## 2021-06-26 DIAGNOSIS — E785 Hyperlipidemia, unspecified: Secondary | ICD-10-CM | POA: Diagnosis present

## 2021-06-26 DIAGNOSIS — Z8041 Family history of malignant neoplasm of ovary: Secondary | ICD-10-CM | POA: Diagnosis not present

## 2021-06-26 DIAGNOSIS — I480 Paroxysmal atrial fibrillation: Secondary | ICD-10-CM

## 2021-06-26 DIAGNOSIS — G4730 Sleep apnea, unspecified: Secondary | ICD-10-CM | POA: Diagnosis present

## 2021-06-26 DIAGNOSIS — I4819 Other persistent atrial fibrillation: Secondary | ICD-10-CM

## 2021-06-26 DIAGNOSIS — D6869 Other thrombophilia: Secondary | ICD-10-CM | POA: Diagnosis not present

## 2021-06-26 DIAGNOSIS — D6859 Other primary thrombophilia: Secondary | ICD-10-CM | POA: Diagnosis present

## 2021-06-26 DIAGNOSIS — Z79890 Hormone replacement therapy (postmenopausal): Secondary | ICD-10-CM | POA: Diagnosis not present

## 2021-06-26 DIAGNOSIS — N400 Enlarged prostate without lower urinary tract symptoms: Secondary | ICD-10-CM | POA: Diagnosis present

## 2021-06-26 DIAGNOSIS — Z7901 Long term (current) use of anticoagulants: Secondary | ICD-10-CM | POA: Diagnosis not present

## 2021-06-26 DIAGNOSIS — Z87891 Personal history of nicotine dependence: Secondary | ICD-10-CM | POA: Diagnosis not present

## 2021-06-26 DIAGNOSIS — Z85828 Personal history of other malignant neoplasm of skin: Secondary | ICD-10-CM | POA: Diagnosis not present

## 2021-06-26 HISTORY — PX: INCISION AND DRAINAGE: SHX5863

## 2021-06-26 HISTORY — PX: KNEE ARTHROSCOPY: SHX127

## 2021-06-26 LAB — SYNOVIAL CELL COUNT + DIFF, W/ CRYSTALS
Crystals, Fluid: NONE SEEN
Eosinophils-Synovial: 0 %
Lymphocytes-Synovial Fld: 8 %
Monocyte-Macrophage-Synovial Fluid: 0 %
Neutrophil, Synovial: 92 %
Other Cells-SYN: 0
WBC, Synovial: 65108 /mm3 — ABNORMAL HIGH (ref 0–200)

## 2021-06-26 LAB — GLUCOSE, CAPILLARY: Glucose-Capillary: 110 mg/dL — ABNORMAL HIGH (ref 70–99)

## 2021-06-26 LAB — BASIC METABOLIC PANEL
Anion gap: 6 (ref 5–15)
BUN: 10 mg/dL (ref 8–23)
CO2: 24 mmol/L (ref 22–32)
Calcium: 8 mg/dL — ABNORMAL LOW (ref 8.9–10.3)
Chloride: 102 mmol/L (ref 98–111)
Creatinine, Ser: 0.84 mg/dL (ref 0.61–1.24)
GFR, Estimated: >60 mL/min (ref >60)
Glucose, Bld: 96 mg/dL (ref 70–99)
Potassium: 4.2 mmol/L (ref 3.5–5.1)
Sodium: 132 mmol/L — ABNORMAL LOW (ref 135–145)

## 2021-06-26 LAB — CBC
HCT: 32.2 % — ABNORMAL LOW (ref 39.0–52.0)
Hemoglobin: 11.4 g/dL — ABNORMAL LOW (ref 13.0–17.0)
MCH: 37.4 pg — ABNORMAL HIGH (ref 26.0–34.0)
MCHC: 35.4 g/dL (ref 30.0–36.0)
MCV: 105.6 fL — ABNORMAL HIGH (ref 80.0–100.0)
Platelets: 265 10*3/uL (ref 150–400)
RBC: 3.05 MIL/uL — ABNORMAL LOW (ref 4.22–5.81)
RDW: 11.8 % (ref 11.5–15.5)
WBC: 5.9 10*3/uL (ref 4.0–10.5)
nRBC: 0 % (ref 0.0–0.2)

## 2021-06-26 SURGERY — ARTHROSCOPY, KNEE
Anesthesia: General | Site: Knee | Laterality: Right

## 2021-06-26 MED ORDER — METHOCARBAMOL 500 MG PO TABS
500.0000 mg | ORAL_TABLET | Freq: Four times a day (QID) | ORAL | Status: DC | PRN
  Administered 2021-06-26 – 2021-06-28 (×5): 500 mg via ORAL
  Filled 2021-06-26 (×6): qty 1

## 2021-06-26 MED ORDER — LIDOCAINE HCL 1 % IJ SOLN
INTRAMUSCULAR | Status: DC | PRN
  Administered 2021-06-26: 30 mL

## 2021-06-26 MED ORDER — BUPIVACAINE-EPINEPHRINE (PF) 0.25% -1:200000 IJ SOLN
INTRAMUSCULAR | Status: AC
  Filled 2021-06-26: qty 30

## 2021-06-26 MED ORDER — VANCOMYCIN HCL 1500 MG/300ML IV SOLN
1500.0000 mg | Freq: Two times a day (BID) | INTRAVENOUS | Status: DC
  Filled 2021-06-26 (×2): qty 300

## 2021-06-26 MED ORDER — FENTANYL CITRATE (PF) 100 MCG/2ML IJ SOLN
INTRAMUSCULAR | Status: AC
  Administered 2021-06-26: 50 ug via INTRAVENOUS
  Filled 2021-06-26: qty 2

## 2021-06-26 MED ORDER — VANCOMYCIN HCL 1500 MG/300ML IV SOLN
1500.0000 mg | Freq: Two times a day (BID) | INTRAVENOUS | Status: DC
  Administered 2021-06-26 – 2021-06-27 (×2): 1500 mg via INTRAVENOUS
  Filled 2021-06-26 (×4): qty 300

## 2021-06-26 MED ORDER — PROPOFOL 10 MG/ML IV BOLUS
INTRAVENOUS | Status: DC | PRN
  Administered 2021-06-26: 50 mg via INTRAVENOUS
  Administered 2021-06-26: 30 mg via INTRAVENOUS
  Administered 2021-06-26: 150 mg via INTRAVENOUS

## 2021-06-26 MED ORDER — FENTANYL CITRATE (PF) 100 MCG/2ML IJ SOLN
25.0000 ug | INTRAMUSCULAR | Status: DC | PRN
  Administered 2021-06-26: 50 ug via INTRAVENOUS
  Administered 2021-06-26: 25 ug via INTRAVENOUS

## 2021-06-26 MED ORDER — PROPOFOL 10 MG/ML IV BOLUS
INTRAVENOUS | Status: AC
  Filled 2021-06-26: qty 40

## 2021-06-26 MED ORDER — RINGERS IRRIGATION IR SOLN
Status: DC | PRN
  Administered 2021-06-26: 12000 mL
  Administered 2021-06-26: 3000 mL

## 2021-06-26 MED ORDER — LIDOCAINE HCL (PF) 1 % IJ SOLN
INTRAMUSCULAR | Status: AC
  Filled 2021-06-26: qty 30

## 2021-06-26 MED ORDER — FENTANYL CITRATE (PF) 100 MCG/2ML IJ SOLN
INTRAMUSCULAR | Status: DC | PRN
  Administered 2021-06-26 (×3): 25 ug via INTRAVENOUS
  Administered 2021-06-26: 50 ug via INTRAVENOUS
  Administered 2021-06-26: 25 ug via INTRAVENOUS
  Administered 2021-06-26: 50 ug via INTRAVENOUS

## 2021-06-26 MED ORDER — OXYCODONE HCL 5 MG PO TABS
ORAL_TABLET | ORAL | Status: AC
  Filled 2021-06-26: qty 1

## 2021-06-26 MED ORDER — MEPERIDINE HCL 25 MG/ML IJ SOLN: 6.2500 mg | INTRAMUSCULAR | Status: DC | PRN

## 2021-06-26 MED ORDER — PHENYLEPHRINE HCL (PRESSORS) 10 MG/ML IV SOLN
INTRAVENOUS | Status: DC | PRN
  Administered 2021-06-26 (×2): 200 ug via INTRAVENOUS

## 2021-06-26 MED ORDER — MIDAZOLAM HCL 2 MG/2ML IJ SOLN
INTRAMUSCULAR | Status: DC | PRN
  Administered 2021-06-26: 2 mg via INTRAVENOUS

## 2021-06-26 MED ORDER — FENTANYL CITRATE (PF) 100 MCG/2ML IJ SOLN
INTRAMUSCULAR | Status: AC
  Filled 2021-06-26: qty 2

## 2021-06-26 MED ORDER — METHOCARBAMOL 1000 MG/10ML IJ SOLN
500.0000 mg | Freq: Three times a day (TID) | INTRAVENOUS | Status: DC | PRN
  Filled 2021-06-26 (×3): qty 5

## 2021-06-26 MED ORDER — EPHEDRINE SULFATE 50 MG/ML IJ SOLN
INTRAMUSCULAR | Status: DC | PRN
  Administered 2021-06-26: 10 mg via INTRAVENOUS

## 2021-06-26 MED ORDER — FENTANYL CITRATE (PF) 100 MCG/2ML IJ SOLN
INTRAMUSCULAR | Status: AC
  Administered 2021-06-26: 25 ug via INTRAVENOUS
  Filled 2021-06-26: qty 2

## 2021-06-26 MED ORDER — LIDOCAINE HCL (CARDIAC) PF 100 MG/5ML IV SOSY
PREFILLED_SYRINGE | INTRAVENOUS | Status: DC | PRN
  Administered 2021-06-26: 100 mg via INTRAVENOUS

## 2021-06-26 MED ORDER — ONDANSETRON HCL 4 MG/2ML IJ SOLN: 4.0000 mg | Freq: Once | INTRAMUSCULAR | Status: DC | PRN

## 2021-06-26 MED ORDER — LACTATED RINGERS IV SOLN: INTRAVENOUS | Status: DC | PRN

## 2021-06-26 MED ORDER — DEXMEDETOMIDINE HCL IN NACL 200 MCG/50ML IV SOLN
INTRAVENOUS | Status: DC | PRN
  Administered 2021-06-26: 4 ug via INTRAVENOUS

## 2021-06-26 MED ORDER — ONDANSETRON HCL 4 MG/2ML IJ SOLN
INTRAMUSCULAR | Status: DC | PRN
  Administered 2021-06-26: 4 mg via INTRAVENOUS

## 2021-06-26 MED ORDER — PHENYLEPHRINE HCL-NACL 20-0.9 MG/250ML-% IV SOLN
INTRAVENOUS | Status: DC | PRN
  Administered 2021-06-26: 40 ug/min via INTRAVENOUS

## 2021-06-26 MED ORDER — OXYCODONE HCL 5 MG PO TABS
5.0000 mg | ORAL_TABLET | ORAL | Status: DC | PRN
  Administered 2021-06-26 – 2021-06-27 (×2): 5 mg via ORAL
  Administered 2021-06-27: 10 mg via ORAL
  Administered 2021-06-28: 5 mg via ORAL
  Administered 2021-06-28: 10 mg via ORAL
  Administered 2021-06-29 (×2): 5 mg via ORAL
  Filled 2021-06-26: qty 2
  Filled 2021-06-26: qty 1
  Filled 2021-06-26 (×2): qty 2
  Filled 2021-06-26 (×3): qty 1

## 2021-06-26 MED ORDER — GLYCOPYRROLATE 0.2 MG/ML IJ SOLN
INTRAMUSCULAR | Status: DC | PRN
  Administered 2021-06-26 (×2): .1 mg via INTRAVENOUS

## 2021-06-26 MED ORDER — MIDAZOLAM HCL 2 MG/2ML IJ SOLN
INTRAMUSCULAR | Status: AC
  Filled 2021-06-26: qty 2

## 2021-06-26 MED ORDER — MORPHINE SULFATE (PF) 2 MG/ML IV SOLN
2.0000 mg | INTRAVENOUS | Status: AC | PRN
Start: 2021-06-26 — End: 2021-06-27
  Administered 2021-06-26 – 2021-06-27 (×3): 2 mg via INTRAVENOUS
  Filled 2021-06-26 (×2): qty 1

## 2021-06-26 SURGICAL SUPPLY — 76 items
ADAPTER IRRIG TUBE 2 SPIKE SOL (ADAPTER) IMPLANT
BLADE FULL RADIUS 3.5 (BLADE) IMPLANT
BLADE SHAVER 4.5X7 STR FR (MISCELLANEOUS) IMPLANT
BNDG COHESIVE 6X5 TAN ST LF (GAUZE/BANDAGES/DRESSINGS) ×2 IMPLANT
BUR BR 5.5 WIDE MOUTH (BURR) IMPLANT
CNTNR SPEC 2.5X3XGRAD LEK (MISCELLANEOUS)
CONT SPEC 4OZ STER OR WHT (MISCELLANEOUS)
CONTAINER SPEC 2.5X3XGRAD LEK (MISCELLANEOUS) IMPLANT
COOLER POLAR GLACIER W/PUMP (MISCELLANEOUS) IMPLANT
CUFF TOURN SGL QUICK 18X4 (TOURNIQUET CUFF) IMPLANT
CUFF TOURN SGL QUICK 24 (TOURNIQUET CUFF)
CUFF TOURN SGL QUICK 34 (TOURNIQUET CUFF)
CUFF TRNQT CYL 24X4X16.5-23 (TOURNIQUET CUFF) IMPLANT
CUFF TRNQT CYL 34X4.125X (TOURNIQUET CUFF) IMPLANT
DEVICE SUCT BLK HOLE OR FLOOR (MISCELLANEOUS) IMPLANT
DRAPE 3/4 80X56 (DRAPES) ×2 IMPLANT
DRAPE IMP U-DRAPE 54X76 (DRAPES) ×2 IMPLANT
DRAPE INCISE IOBAN 66X60 STRL (DRAPES) ×2 IMPLANT
DRAPE SURG 17X11 SM STRL (DRAPES) ×4 IMPLANT
DRAPE U-SHAPE 47X51 STRL (DRAPES) ×2 IMPLANT
DURAPREP 26ML APPLICATOR (WOUND CARE) ×6 IMPLANT
ELECT CAUTERY BLADE 6.4 (BLADE) ×2 IMPLANT
ELECT REM PT RETURN 9FT ADLT (ELECTROSURGICAL) ×2
ELECTRODE REM PT RTRN 9FT ADLT (ELECTROSURGICAL) ×1 IMPLANT
GAUZE 4X4 16PLY ~~LOC~~+RFID DBL (SPONGE) IMPLANT
GAUZE SPONGE 4X4 12PLY STRL (GAUZE/BANDAGES/DRESSINGS) ×2 IMPLANT
GAUZE XEROFORM 1X8 LF (GAUZE/BANDAGES/DRESSINGS) ×2 IMPLANT
GLOVE SURG ORTHO LTX SZ9 (GLOVE) ×6 IMPLANT
GLOVE SURG UNDER POLY LF SZ9 (GLOVE) ×2 IMPLANT
GOWN STRL REUS TWL 2XL XL LVL4 (GOWN DISPOSABLE) ×2 IMPLANT
GOWN STRL REUS W/ TWL LRG LVL3 (GOWN DISPOSABLE) ×2 IMPLANT
GOWN STRL REUS W/TWL LRG LVL3 (GOWN DISPOSABLE) ×2
HEMOVAC 400CC 10FR (MISCELLANEOUS) IMPLANT
HEMOVAC 400ML (MISCELLANEOUS) ×2
HEMOVAC LRG 3/16 (MISCELLANEOUS) ×2 IMPLANT
IV LACTATED RINGER IRRG 3000ML (IV SOLUTION) ×5
IV LR IRRIG 3000ML ARTHROMATIC (IV SOLUTION) ×5 IMPLANT
KIT DRAIN HEMOVAC JP 7FR 400ML (MISCELLANEOUS) ×1 IMPLANT
KIT TURNOVER KIT A (KITS) ×2 IMPLANT
MANIFOLD NEPTUNE II (INSTRUMENTS) ×4 IMPLANT
MAT ABSORB  FLUID 56X50 GRAY (MISCELLANEOUS) ×2
MAT ABSORB FLUID 56X50 GRAY (MISCELLANEOUS) ×2 IMPLANT
NDL SAFETY ECLIPSE 18X1.5 (NEEDLE) ×1 IMPLANT
NEEDLE FILTER BLUNT 18X 1/2SAF (NEEDLE)
NEEDLE FILTER BLUNT 18X1 1/2 (NEEDLE) IMPLANT
NEEDLE HYPO 18GX1.5 SHARP (NEEDLE) ×1
NEEDLE HYPO 22GX1.5 SAFETY (NEEDLE) ×2 IMPLANT
NS IRRIG 500ML POUR BTL (IV SOLUTION) ×2 IMPLANT
PACK ARTHROSCOPY KNEE (MISCELLANEOUS) ×2 IMPLANT
PACK EXTREMITY ARMC (MISCELLANEOUS) IMPLANT
PAD ABD DERMACEA PRESS 5X9 (GAUZE/BANDAGES/DRESSINGS) ×4 IMPLANT
PAD WRAPON POLAR KNEE (MISCELLANEOUS) IMPLANT
SHAVER BLADE BONE CUTTER  5.5 (BLADE)
SHAVER BLADE BONE CUTTER 4.5 (BLADE) ×2 IMPLANT
SHAVER BLADE BONE CUTTER 5.5 (BLADE) IMPLANT
SHAVER BLADE TAPERED BLUNT 4 (BLADE) ×2 IMPLANT
SOL PREP PVP 2OZ (MISCELLANEOUS)
SOLUTION PREP PVP 2OZ (MISCELLANEOUS) IMPLANT
SPONGE T-LAP 18X18 ~~LOC~~+RFID (SPONGE) ×2 IMPLANT
STAPLER SKIN PROX 35W (STAPLE) IMPLANT
STRIP CLOSURE SKIN 1/2X4 (GAUZE/BANDAGES/DRESSINGS) ×2 IMPLANT
SUT ETHIBOND #5 BRAIDED 30INL (SUTURE) IMPLANT
SUT ETHILON 4-0 (SUTURE) ×1
SUT ETHILON 4-0 FS2 18XMFL BLK (SUTURE) ×1
SUT TICRON 2-0 30IN 311381 (SUTURE) IMPLANT
SUT VIC AB 0 CT1 27 (SUTURE)
SUT VIC AB 0 CT1 27XCR 8 STRN (SUTURE) IMPLANT
SUT VIC AB 1 CTX 27 (SUTURE) IMPLANT
SUTURE ETHLN 4-0 FS2 18XMF BLK (SUTURE) ×1 IMPLANT
SYR 10ML LL (SYRINGE) ×2 IMPLANT
TAPE MICROFOAM 4IN (TAPE) ×2 IMPLANT
TUBING INFLOW SET DBFLO PUMP (TUBING) ×2 IMPLANT
TUBING OUTFLOW SET DBLFO PUMP (TUBING) ×2 IMPLANT
WAND WEREWOLF FLOW 90D (MISCELLANEOUS) ×2 IMPLANT
WATER STERILE IRR 500ML POUR (IV SOLUTION) ×2 IMPLANT
WRAPON POLAR PAD KNEE (MISCELLANEOUS)

## 2021-06-26 NOTE — Assessment & Plan Note (Addendum)
--   Remains stable, will continue metoprolol, apixaban

## 2021-06-26 NOTE — Anesthesia Postprocedure Evaluation (Signed)
Anesthesia Post Note  Patient: Alexander Carey  Procedure(s) Performed: ARTHROSCOPY KNEE (Right: Knee) INCISION AND DRAINAGE (Right: Knee)  Patient location during evaluation: PACU Anesthesia Type: General Level of consciousness: awake and alert, awake and oriented Pain management: pain level controlled Vital Signs Assessment: post-procedure vital signs reviewed and stable Respiratory status: spontaneous breathing, nonlabored ventilation and respiratory function stable Cardiovascular status: blood pressure returned to baseline and stable Postop Assessment: no apparent nausea or vomiting Anesthetic complications: no   No notable events documented.   Last Vitals:  Vitals:   06/26/21 1644 06/26/21 1947  BP: 119/67 140/77  Pulse: 76 68  Resp:  20  Temp:  36.8 C  SpO2: 99% 98%    Last Pain:  Vitals:   06/26/21 1832  TempSrc:   PainSc: Asleep                 Phill Mutter

## 2021-06-26 NOTE — Progress Notes (Signed)
  Progress Note    Alexander Carey   HUT:[id number]  DOB: 04-28-1951  DOA: 06/25/2021     0 Date of Service: 06/26/2021   70 year old man PMH including atrial fibrillation on apixaban, with recent complicated orthopedic history with a right knee abscess presented as a direct admission for recurrent right knee infection. --Seen by infectious disease and orthopedics --10/12 status post right knee revision arthroscopic irrigation debridement  Subjective:  Seen s/p surgery, feels ok  Hospital Problems * Septic joint of right knee joint ([place]) -- Status post surgery 10/12.  Continue empiric antibiotics per infectious disease.  Likely home on IV antibiotics.  Acquired thrombophilia ([place]) -- Secondary to atrial fibrillation.  Apixaban when cleared from orthopedic perspective.  Persistent atrial fibrillation (HCC) --Stable.  Continue metoprolol, apixaban when cleared by surgery.  Benign essential HTN -- Stable.  Continue Cozaar and metoprolol  Hypothyroidism -- Continue levothyroxine    Objective Vital signs were reviewed and unremarkable.  Vitals:   06/26/21 1545 06/26/21 1600 06/26/21 1615 06/26/21 1644  BP: 90/75 96/72 113/75 119/67  Pulse: 86 80 72 76  Resp: 12 20 12    Temp:   (!) 97.2 F (36.2 C)   TempSrc:      SpO2: 96% 96% 94% 99%  Weight:      Height:       99.8 kg  Exam Physical Exam Vitals reviewed.  Cardiovascular:     Rate and Rhythm: Normal rate and regular rhythm.     Heart sounds: No murmur heard. Pulmonary:     Effort: No respiratory distress.     Breath sounds: No wheezing or rales.  Neurological:     Mental Status: He is alert.  Psychiatric:        Mood and Affect: Mood normal.        Behavior: Behavior normal.     Labs / Other Information My review of labs, imaging, notes and other tests is significant for WBC 65,000    Time spent: 20 minutes [facility] 06/26/2021, 6:21 PM

## 2021-06-26 NOTE — Transfer of Care (Signed)
Immediate Anesthesia Transfer of Care Note  Patient: Alexander Carey  Procedure(s) Performed: ARTHROSCOPY KNEE (Right: Knee) INCISION AND DRAINAGE (Right: Knee)  Patient Location: PACU  Anesthesia Type:General  Level of Consciousness: drowsy  Airway & Oxygen Therapy: Patient Spontanous Breathing and Patient connected to face mask oxygen  Post-op Assessment: Report given to RN and Post -op Vital signs reviewed and stable  Post vital signs: stable  Last Vitals:  Vitals Value Taken Time  BP 110/61 06/26/21 1507  Temp 36.7 C 06/26/21 1507  Pulse 88 06/26/21 1512  Resp 14 06/26/21 1512  SpO2 100 % 06/26/21 1512  Vitals shown include unvalidated device data.  Last Pain:  Vitals:   06/26/21 1232  TempSrc: Temporal  PainSc: 7       Patients Stated Pain Goal: 2 (20/94/70 9628)  Complications: No notable events documented.

## 2021-06-26 NOTE — Assessment & Plan Note (Addendum)
--   Status post surgery 10/12.  Continue empiric antibiotics per ID --home today per orthopedics

## 2021-06-26 NOTE — Anesthesia Preprocedure Evaluation (Signed)
Anesthesia Evaluation  Patient identified by MRN, date of birth, ID band Patient awake    Reviewed: Allergy & Precautions, NPO status , Patient's Chart, lab work & pertinent test results, reviewed documented beta blocker date and time   History of Anesthesia Complications Negative for: history of anesthetic complications  Airway Mallampati: II  TM Distance: >3 FB Neck ROM: Full    Dental  (+) Dental Advidsory Given, Teeth Intact   Pulmonary neg shortness of breath, sleep apnea (borderline, not using CPAP) , neg COPD, neg recent URI, former smoker,    Pulmonary exam normal        Cardiovascular hypertension, Pt. on medications and Pt. on home beta blockers (-) angina(-) Past MI and (-) CHF Normal cardiovascular exam(-) dysrhythmias (-) Valvular Problems/Murmurs     Neuro/Psych neg Seizures    GI/Hepatic Neg liver ROS, neg GERD  ,  Endo/Other  neg diabetesHypothyroidism   Renal/GU negative Renal ROS     Musculoskeletal  (+) Arthritis , Osteoarthritis,    Abdominal   Peds  Hematology   Anesthesia Other Findings . AF (atrial fibrillation) ([place])  . Back pain   lower . Basal cell carcinoma 11/01/2019  nasal tip, right ear concha . ED (erectile dysfunction)  . Enlarged prostate  . Essential hypertension  . HOH (hard of hearing)  . Hypogonadism in male  . Hypothyroidism  . Paroxysmal A-fib ([place])   a. 07/2018 s/p PVI.     Reproductive/Obstetrics                            Anesthesia Physical  Anesthesia Plan  ASA: 3  Anesthesia Plan: General   Post-op Pain Management:    Induction: Intravenous  PONV Risk Score and Plan: 2 and Ondansetron, Dexamethasone and Treatment may vary due to age or medical condition  Airway Management Planned: LMA  Additional Equipment:   Intra-op Plan:   Post-operative Plan: Extubation in OR  Informed Consent: I have reviewed the  patients History and Physical, chart, labs and discussed the procedure including the risks, benefits and alternatives for the proposed anesthesia with the patient or authorized representative who has indicated his/her understanding and acceptance.       Plan Discussed with: CRNA, Anesthesiologist and Surgeon  Anesthesia Plan Comments:        Anesthesia Quick Evaluation

## 2021-06-26 NOTE — Assessment & Plan Note (Addendum)
--   Secondary to atrial fibrillation.  Continue apixaban

## 2021-06-26 NOTE — Consult Note (Signed)
Pharmacy Antibiotic Note  Alexander Carey is a 70 y.o. male with PMH of afib, basal cell carcinoma, HTN, and hypothyroidism who was admitted on 06/25/2021 with recurrent R knee post-op infection. Pharmacy has been consulted for vancomycin and cefepime dosing.  Plan: Vancomycin 2000 mg IV loading dose, followed by 1500 mg IV q12h Goal AUC 400-550  Est AUC: 536.1 Est Cmax: 34.4 Est Cmin: 15.2 Calculated with SCr 0.84, IBW, Vd 0.2  Cefepime 2 g IV q8h  Monitor clinical picture, renal function, and vancomycin levels at steady state  F/U C&S, abx deescalation / LOT   Temp (24hrs), Avg:98 F (36.7 C), Min:97.8 F (36.6 C), Max:98.4 F (36.9 C)  Recent Labs  Lab 06/20/21 0934 06/21/21 0436 06/25/21 1614 06/25/21 1623 06/26/21 0539  WBC 6.2 9.3 7.1  --  5.9  CREATININE 0.97 0.91  --  0.96 0.84     Estimated Creatinine Clearance: 98.7 mL/min (by C-G formula based on SCr of 0.84 mg/dL).    Allergies  Allergen Reactions   Statins Other (See Comments)    Patient declines any statins   Ciprofloxacin Rash    Antimicrobials this admission: Vancomycin 10/11 >>  Cefepime 10/11 >>   Thank you for allowing pharmacy to be a part of this patient's care.  Darnelle Bos, PharmD 06/26/2021 10:44 AM

## 2021-06-26 NOTE — Assessment & Plan Note (Signed)
Continue levothyroxine 

## 2021-06-26 NOTE — Hospital Course (Addendum)
70 year old man PMH including atrial fibrillation on apixaban, with recent complicated orthopedic history with a right knee abscess presented as a direct admission for recurrent right knee infection. --Seen by infectious disease and orthopedics --10/12 status post right knee revision arthroscopic irrigation debridement --10/13 feeling a bit better, no new events

## 2021-06-26 NOTE — Anesthesia Procedure Notes (Signed)
Procedure Name: LMA Insertion Date/Time: 06/26/2021 1:38 PM Performed by: Juliene Pina, CRNA Pre-anesthesia Checklist: Patient identified, Patient being monitored, Timeout performed, Emergency Drugs available and Suction available Patient Re-evaluated:Patient Re-evaluated prior to induction Oxygen Delivery Method: Circle system utilized Preoxygenation: Pre-oxygenation with 100% oxygen Induction Type: IV induction Ventilation: Mask ventilation without difficulty LMA: LMA inserted Tube type: Oral Number of attempts: 1 Placement Confirmation: positive ETCO2 and breath sounds checked- equal and bilateral Tube secured with: Tape Dental Injury: Teeth and Oropharynx as per pre-operative assessment

## 2021-06-26 NOTE — Op Note (Signed)
06/26/2021  4:02 PM  PATIENT:  Alexander Carey    PRE-OPERATIVE DIAGNOSIS:  Septic Right knee joint s/p right knee arthroscopy on 05/30/21  POST-OPERATIVE DIAGNOSIS:  Same  PROCEDURE:  RIGHT KNEE REVISION ARTHROSCOPIC IRRIGATION AND DEBRIDEMENT  SURGEON:  Thornton Park, MD  ANESTHESIA:   General  PREOPERATIVE INDICATIONS:  [name] is a  70 y.o. male with a diagnosis of Right Knee Fracture who failed conservative measures and elected for surgical management.    I discussed the risks and benefits of surgery. The risks include but are not limited to persistent or recurrent infection, bleeding , nerve or blood vessel injury, joint stiffness or loss of motion, persistent pain, weakness or instability, and the need for further surgery. Medical risks include but are not limited to DVT and pulmonary embolism, myocardial infarction, stroke, pneumonia, respiratory failure and death. Patient understood these risks and wished to proceed.    OPERATIVE PROCEDURE: Patient was met in the preoperative area.  The right knee was marked according hospital's correct site of surgery protocol.  The patient was brought to the operating room was placed supine on the operative table.  He underwent general anesthesia.  He was prepped and draped in a sterile fashion.  A timeout was performed of appropriately patient's name, date of birth, medical record number, correct site of surgery and correct procedure to be performed.  Once all in attendance were agreement the case began.   Sutures were removed from the patient's medial lateral incisions.  The patient's previous lateral arthroscopic portal incision was then reopened with 11 blade.  The arthroscopic cannula was placed inside the knee.  Approximately 20cc of cloudy serous fluid was collected in a specimen cup through the cannula.  This synovial fluid was sent to the lab for culture, Gram stain and crystals and cell count.     The patient's medial  arthroscopic portal was then reopened with an 11 blade.  An arthroscopic evaluation of the knee was performed including evaluation of the suprapatellar pouch, medial lateral gutters, the medial lateral compartments and the intercondylar notch.  Patient had no evidence of significant changes compared to his previous arthroscopy.  There was no significant chondral wear or injury.  Patient had left lateral arthroscopic irrigation and debridement of the suprapatellar pouch, and the medial lateral gutters and the medial lateral compartments as well as the intercondylar notch.  No purulent fluid was encountered. There was no obvious areas of osteomyelitis seen.  Patient had an extensive tricompartmental synovectomy with a 90 degree werewolf wand.   After diagnostic arthroscopy, an arthroscopic shaver was used to lavage the knee.  The patient had 10 L of LR used to irrigate and lavage the right knee.  All arthroscopic instruments were then removed.  A large Hemovac drain was placed through the lateral portal.  The 2 arthroscopic portals closed with 4-0 nylon.  A suture was also used to secure the drain.  A dry sterile dressing was applied.  The patient was awoken and brought to the PACU in stable condition.  I spoke with the patient's wife in the postop consultation room to let her know the findings of the procedure and that her husband was stable in the recovery room.

## 2021-06-26 NOTE — Assessment & Plan Note (Signed)
--   Stable.  Continue Cozaar and metoprolol

## 2021-06-26 NOTE — Progress Notes (Signed)
Subjective:  POST OP CHECK s/p arthroscopic incision and drainage of the right knee.   Patient reports right knee pain as moderate.  Patient has significant pain when he arrived at the floor and was treated with Norco which has improved his symptoms.  Objective:   VITALS:   Vitals:   06/26/21 1545 06/26/21 1600 06/26/21 1615 06/26/21 1644  BP: 90/75 96/72 113/75 119/67  Pulse: 86 80 72 76  Resp: 12 20 12    Temp:   (!) 97.2 F (36.2 C)   TempSrc:      SpO2: 96% 96% 94% 99%  Weight:      Height:        PHYSICAL EXAM: Right lower extremity: Patient's dressing remains clean dry and intact.  Patient's Hemovac drain shows a small amount of output but is still functioning.  Patient has no significant thigh or leg swelling.  His leg and thigh compartments are soft and compressible. Neurovascular intact Sensation intact distally Intact pulses distally Dorsiflexion/Plantar flexion intact No cellulitis present Compartment soft  LABS  Results for orders placed or performed during the hospital encounter of 06/25/21 (from the past 24 hour(s))  Urinalysis, Complete w Microscopic     Status: Abnormal   Collection Time: 06/25/21  7:15 PM  Result Value Ref Range   Color, Urine YELLOW (A) YELLOW   APPearance CLEAR (A) CLEAR   Specific Gravity, Urine 1.013 1.005 - 1.030   pH 6.0 5.0 - 8.0   Glucose, UA NEGATIVE NEGATIVE mg/dL   Hgb urine dipstick NEGATIVE NEGATIVE   Bilirubin Urine NEGATIVE NEGATIVE   Ketones, ur NEGATIVE NEGATIVE mg/dL   Protein, ur NEGATIVE NEGATIVE mg/dL   Nitrite NEGATIVE NEGATIVE   Leukocytes,Ua NEGATIVE NEGATIVE   WBC, UA 0-5 0 - 5 WBC/hpf   Bacteria, UA NONE SEEN NONE SEEN   Squamous Epithelial / LPF NONE SEEN 0 - 5   Mucus PRESENT    Hyaline Casts, UA PRESENT   Basic metabolic panel     Status: Abnormal   Collection Time: 06/26/21  5:39 AM  Result Value Ref Range   Sodium 132 (L) 135 - 145 mmol/L   Potassium 4.2 3.5 - 5.1 mmol/L   Chloride 102 98 - 111  mmol/L   CO2 24 22 - 32 mmol/L   Glucose, Bld 96 70 - 99 mg/dL   BUN 10 8 - 23 mg/dL   Creatinine, Ser 0.84 0.61 - 1.24 mg/dL   Calcium 8.0 (L) 8.9 - 10.3 mg/dL   GFR, Estimated >60 >60 mL/min   Anion gap 6 5 - 15  CBC     Status: Abnormal   Collection Time: 06/26/21  5:39 AM  Result Value Ref Range   WBC 5.9 4.0 - 10.5 K/uL   RBC 3.05 (L) 4.22 - 5.81 MIL/uL   Hemoglobin 11.4 (L) 13.0 - 17.0 g/dL   HCT 32.2 (L) 39.0 - 52.0 %   MCV 105.6 (H) 80.0 - 100.0 fL   MCH 37.4 (H) 26.0 - 34.0 pg   MCHC 35.4 30.0 - 36.0 g/dL   RDW 11.8 11.5 - 15.5 %   Platelets 265 150 - 400 K/uL   nRBC 0.0 0.0 - 0.2 %  Synovial cell count + diff, w/ crystals     Status: Abnormal   Collection Time: 06/26/21  2:18 PM  Result Value Ref Range   Color, Synovial ORANGE (A) YELLOW   Appearance-Synovial TURBID (A) CLEAR   Crystals, Fluid NO CRYSTALS SEEN    WBC, Synovial  65,108 (H) 0 - 200 /cu mm   Neutrophil, Synovial 92 %   Lymphocytes-Synovial Fld 8 %   Monocyte-Macrophage-Synovial Fluid 0 %   Eosinophils-Synovial 0 %   Other Cells-SYN 0     No results found.  Assessment/Plan: Day of Surgery   Principal Problem:   Septic joint of right knee joint (HCC) Active Problems:   Dyslipidemia   Degenerative arthritis of lumbar spine   Persistent atrial fibrillation (HCC)   Incomplete tear of right rotator cuff   Sleep apnea   AF (paroxysmal atrial fibrillation) (HCC)   On continuous oral anticoagulation   Hypothyroidism   Benign essential HTN   Acquired thrombophilia ([place])   Septic arthritis ([place])  I spoke with Dr. Ola Spurr from infectious disease this afternoon.  He is recommending at least 2 weeks of IV antibiotics.  He states he will likely recommend daptomycin for the patient.  I will follow-up on cultures from the synovial fluid sent from the OR today.  Continue current pain management.  Patient will have a Hemovac drain removed in the next 24 to 48 hours depending on output.    Thornton Park , MD 06/26/2021, 6:36 PM

## 2021-06-27 ENCOUNTER — Encounter: Payer: Self-pay | Admitting: Orthopedic Surgery

## 2021-06-27 ENCOUNTER — Inpatient Hospital Stay: Payer: Self-pay

## 2021-06-27 LAB — GLUCOSE, CAPILLARY
Glucose-Capillary: 101 mg/dL — ABNORMAL HIGH (ref 70–99)
Glucose-Capillary: 114 mg/dL — ABNORMAL HIGH (ref 70–99)
Glucose-Capillary: 133 mg/dL — ABNORMAL HIGH (ref 70–99)
Glucose-Capillary: 121 mg/dL — ABNORMAL HIGH (ref 70–99)

## 2021-06-27 LAB — CREATININE, SERUM
Creatinine, Ser: 0.82 mg/dL (ref 0.61–1.24)
GFR, Estimated: >60 mL/min (ref >60)

## 2021-06-27 MED ORDER — SODIUM CHLORIDE 0.9% FLUSH: 10.0000 mL | INTRAVENOUS | Status: DC | PRN

## 2021-06-27 MED ORDER — APIXABAN 5 MG PO TABS: 5.0000 mg | ORAL_TABLET | Freq: Two times a day (BID) | ORAL | Status: DC

## 2021-06-27 MED ORDER — SODIUM CHLORIDE 0.9 % IV SOLN
8.0000 mg/kg | Freq: Every day | INTRAVENOUS | Status: DC
  Administered 2021-06-27: 700 mg via INTRAVENOUS
  Filled 2021-06-27 (×2): qty 14

## 2021-06-27 MED ORDER — APIXABAN 5 MG PO TABS
5.0000 mg | ORAL_TABLET | Freq: Two times a day (BID) | ORAL | Status: DC
  Administered 2021-06-27 – 2021-06-29 (×4): 5 mg via ORAL
  Filled 2021-06-27 (×4): qty 1

## 2021-06-27 MED ORDER — CHLORHEXIDINE GLUCONATE CLOTH 2 % EX PADS
6.0000 | MEDICATED_PAD | Freq: Every day | CUTANEOUS | Status: DC
  Administered 2021-06-27 – 2021-06-29 (×3): 6 via TOPICAL

## 2021-06-27 MED ORDER — SODIUM CHLORIDE 0.9 % IV SOLN
2.0000 g | INTRAVENOUS | Status: DC
  Administered 2021-06-28 – 2021-06-29 (×2): 2 g via INTRAVENOUS
  Filled 2021-06-27 (×2): qty 20

## 2021-06-27 MED ORDER — SODIUM CHLORIDE 0.9% FLUSH
10.0000 mL | Freq: Two times a day (BID) | INTRAVENOUS | Status: DC
  Administered 2021-06-27 – 2021-06-29 (×4): 10 mL

## 2021-06-27 NOTE — Progress Notes (Signed)
Infectious Disease Long Term IV Antibiotic Orders Alexander Carey 03/08/1951  Diagnosis: Septic knee  Culture results Cultures pending- all negative to date   LABS Lab Results  Component Value Date   CREATININE 0.82 06/27/2021   Lab Results  Component Value Date   WBC 5.9 06/26/2021   HGB 11.4 (L) 06/26/2021   HCT 32.2 (L) 06/26/2021   MCV 105.6 (H) 06/26/2021   PLT 265 06/26/2021   Lab Results  Component Value Date   ESRSEDRATE 70 (H) 06/25/2021   Lab Results  Component Value Date   CRP 7.4 (H) 06/25/2021    Allergies:  Allergies  Allergen Reactions   Statins Other (See Comments)    Patient declines any statins   Ciprofloxacin Rash    Discharge antibiotics  Ceftriaxone 2 grams every      24         hours  Daptomycin 700 mg every  24 hours   (weekly CPK)  PICC Care per protocol Labs weekly while on IV antibiotics -FAX weekly labs to [phone] CBC w diff   Cr, LFTs  CPK CRP   Planned duration of antibiotics  2-4 weeks   Stop date 07-11-21 Follow up clinic date  TBD < 2 weeks  Leonel Ramsay, MD

## 2021-06-27 NOTE — Care Management Important Message (Signed)
Important Message  Patient Details  Name: Alexander Carey MRN: [id number] Date of Birth: January 01, 1951   Medicare Important Message Given:  N/A - LOS <3 / Initial given by admissions     Dannette Barbara 06/27/2021, 4:50 PM

## 2021-06-27 NOTE — Progress Notes (Signed)
Peripherally Inserted Central Catheter Placement  The IV Nurse has discussed with the patient and/or persons authorized to consent for the patient, the purpose of this procedure and the potential benefits and risks involved with this procedure.  The benefits include less needle sticks, lab draws from the catheter, and the patient may be discharged home with the catheter. Risks include, but not limited to, infection, bleeding, blood clot (thrombus formation), and puncture of an artery; nerve damage and irregular heartbeat and possibility to perform a PICC exchange if needed/ordered by physician.  Alternatives to this procedure were also discussed.  Bard Power PICC patient education guide, fact sheet on infection prevention and patient information card has been provided to patient /or left at bedside.    PICC Placement Documentation  PICC Single Lumen 06/27/21 Right Brachial 5 cm 0 cm (Active)  Indication for Insertion or Continuance of Line Home intravenous therapies (PICC only) 06/27/21 1635  Exposed Catheter (cm) 0 cm 06/27/21 1635  Site Assessment Clean;Dry;Intact 06/27/21 1635  Line Status Flushed;Saline locked;Blood return noted 06/27/21 1635  Dressing Type Transparent 06/27/21 1635  Dressing Status Clean;Dry;Intact 06/27/21 1635  Antimicrobial disc in place? Yes 06/27/21 [place] Not Applicable 59/16/38 4665  Line Care Connections checked and tightened;Tubing changed 06/27/21 1635  Line Adjustment (NICU/IV Team Only) No 06/27/21 1635  Dressing Intervention New dressing 06/27/21 1635  Dressing Change Due 07/04/21 06/27/21 1635       Rolena Infante 06/27/2021, 4:36 PM

## 2021-06-27 NOTE — TOC Initial Note (Signed)
Transition of Care [facility]) - Initial/Assessment Note    Patient Details  Name: Alexander Carey MRN: [id number] Date of Birth: Jan 19, 1951  [facility] CM/SW Contact:    Pete Pelt, RN Phone Number: 06/27/2021, 11:05 AM  Clinical Narrative:   Patient lives at home with spouse, who can assist if needed.  They also have a network of friends they can call on as needed.  Patient and wife state there are no concerns with transportation to appointments, or obtaining medications.  Patient states that he is able to take medications as directed.  At this time, there are no PT.OT follow-ups or DME recommended.  Harriet [place] with [facility] was notified by ID pharmacist that patient will need IV antibiotics at home.    Patient and spouse have no concerns with patient discharging home at this time.  TOC contact information provided, TOC will follow to discharge.                 Expected Discharge Plan: [place] Barriers to Discharge: Continued Medical Work up   Patient Goals and CMS Choice Patient states their goals for this hospitalization and ongoing recovery are:: To have my foot heal soon.   Choice offered to / list presented to : NA  Expected Discharge Plan and Services Expected Discharge Plan: [place]   Discharge Planning Services: CM Consult Post Acute Care Choice: Home Health (Anticipated home with IV antibiotics) Living arrangements for the past 2 months: Single Family Home                 DME Arranged:  (no DME recommended)         HH Arranged: Higher education careers adviser spoke with at [place]: Carolynn Sayers notified for IV antibiotics,  Prior Living Arrangements/Services Living arrangements for the past 2 months: Single Family Home Lives with:: Self, Spouse Patient language and need for interpreter reviewed:: Yes (No interpreter required) Do you feel safe going back to the place where you live?: Yes       Need for Family Participation in Patient Care: Yes (Comment) Care giver support system in place?: Yes (comment) Current home services:  (No current home services) Criminal Activity/Legal Involvement Pertinent to Current Situation/Hospitalization: No - Comment as needed  Activities of Daily Living Home Assistive Devices/Equipment: Walker (specify type) (rolling walker) ADL Screening (condition at time of admission) Patient's cognitive ability adequate to safely complete daily activities?: Yes Is the patient deaf or have difficulty hearing?: No Does the patient have difficulty seeing, even when wearing glasses/contacts?: No Does the patient have difficulty concentrating, remembering, or making decisions?: No Patient able to express need for assistance with ADLs?: Yes Does the patient have difficulty dressing or bathing?: No Independently performs ADLs?: Yes (appropriate for developmental age) Does the patient have difficulty walking or climbing stairs?: Yes Weakness of Legs: None Weakness of Arms/Hands: None  Permission Sought/Granted Permission sought to share information with : Case Manager Permission granted to share information with : Yes, Verbal Permission Granted     Permission granted to share info w AGENCY: [facility].        Emotional Assessment Appearance:: Appears stated age Attitude/Demeanor/Rapport: Engaged, Gracious Affect (typically observed): Pleasant, Appropriate Orientation: : Oriented to Self, Oriented to Place, Oriented to  Time, Oriented to Situation Alcohol / Substance Use: Not Applicable Psych Involvement: No (comment)  Admission diagnosis:  Infection of right knee ([place]) [  M00.9] Septic arthritis ([place]) [M00.9] Patient Active Problem List   Diagnosis Date Noted   Hypothyroidism 06/26/2021   Benign essential HTN 06/26/2021   Acquired thrombophilia ([place]) 06/26/2021   Septic arthritis ([place]) 06/26/2021   Infection of right knee ([place]) 06/25/2021    AF (paroxysmal atrial fibrillation) ([place]) 06/25/2021   On continuous oral anticoagulation 06/25/2021   Septic joint of right knee joint ([place]) 06/20/2021   Persistent atrial fibrillation ([place])    Sleep apnea 08/11/2017   Dupuytren's contracture of left hand 10/27/2016   Incomplete tear of right rotator cuff 10/27/2016   Primary osteoarthritis of right shoulder 10/06/2016   Rotator cuff tendinitis, right 10/06/2016   Spondylosis of cervical region without myelopathy or radiculopathy 10/06/2016   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/01/2015   Dyslipidemia 08/01/2015   Acid indigestion 08/01/2015   Fecal occult blood test positive 08/01/2015   Allergic rhinitis, seasonal 08/01/2015   Degeneration of intervertebral disc of lumbar region 02/01/2014   Neuritis or radiculitis due to rupture of lumbar intervertebral disc 02/01/2014   Lumbar canal stenosis 02/01/2014   Degenerative arthritis of lumbar spine 02/01/2014   PCP:  Valera Castle, MD Pharmacy:   [facility], [address] [place] [address] Phone: [phone] Fax: [phone]     Social Determinants of Health (SDOH) Interventions    Readmission Risk Interventions No flowsheet data found.

## 2021-06-27 NOTE — Progress Notes (Signed)
  Progress Note    Alexander Carey   UYQ:[id number]  DOB: 02-23-1951  DOA: 06/25/2021     1 Date of Service: 06/27/2021   70 year old man PMH including atrial fibrillation on apixaban, with recent complicated orthopedic history with a right knee abscess presented as a direct admission for recurrent right knee infection. --Seen by infectious disease and orthopedics --10/12 status post right knee revision arthroscopic irrigation debridement --10/13 feeling a bit better, no new events  Hospital Problems * Septic joint of right knee joint ([place]) -- Status post surgery 10/12.  Continue empiric antibiotics per infectious disease.  Likely home on IV antibiotics. -- Remains stable today, feeling a bit better.  Acquired thrombophilia ([place]) -- Secondary to atrial fibrillation.  Resume apixaban when cleared from orthopedic perspective.  Persistent atrial fibrillation (HCC) -- Remains stable, will continue metoprolol. Resume apixaban when cleared by surgery.  Benign essential HTN -- Stable.  Continue Cozaar and metoprolol  Hypothyroidism -- Continue levothyroxine   Subjective:  Feels a bit better today, had significant knee pain earlier.  Eating okay.  Objective Vital signs were reviewed and unremarkable.  Vitals:   06/26/21 1947 06/26/21 2338 06/27/21 0606 06/27/21 0742  BP: 140/77 (!) 161/76 122/75 (!) 153/82  Pulse: 68 77 69 68  Resp: 20 20 18 18   Temp: 98.2 F (36.8 C) 98.4 F (36.9 C) 98.6 F (37 C) 98 F (36.7 C)  TempSrc: Oral     SpO2: 98% 99% 98% 97%  Weight:      Height:       99.8 kg  Exam Physical Exam Vitals reviewed.  Constitutional:      General: He is not in acute distress.    Appearance: He is not ill-appearing or toxic-appearing.  Cardiovascular:     Rate and Rhythm: Normal rate and regular rhythm.     Heart sounds: No murmur heard. Pulmonary:     Effort: Pulmonary effort is normal. No respiratory distress.     Breath sounds: No wheezing, rhonchi or  rales.  Musculoskeletal:     Left lower leg: No edema.     Comments: Right knee wrapped; drain present  Neurological:     Mental Status: He is alert.  Psychiatric:        Mood and Affect: Mood normal.        Behavior: Behavior normal.     Labs / Other Information My review of labs, imaging, notes and other tests is significant for   creatinine stable  Updated wife and daughter at bedside  Time spent: 20 minutes [facility] 06/27/2021, 10:37 AM

## 2021-06-27 NOTE — Progress Notes (Addendum)
[place] RN notified that PICC is ready to use and to remove PIV per policy. 3CG confirmation of PICC placement posted on paper chart.

## 2021-06-27 NOTE — Progress Notes (Signed)
Subjective:  POD #1 s/p arthroscopic irrigation and debridement.   Patient reports right knee pain as mild to moderate.  Patient's wife is at the bedside.  Objective:   VITALS:   Vitals:   06/26/21 2338 06/27/21 0606 06/27/21 0742 06/27/21 1144  BP: (!) 161/76 122/75 (!) 153/82 115/70  Pulse: 77 69 68 63  Resp: 20 18 18 16   Temp: 98.4 F (36.9 C) 98.6 F (37 C) 98 F (36.7 C) 97.8 F (36.6 C)  TempSrc:      SpO2: 99% 98% 97% 99%  Weight:      Height:        PHYSICAL EXAM: Right lower extremity: Neurovascular intact Sensation intact distally Intact pulses distally Dorsiflexion/Plantar flexion intact Dressing: Clean dry and intact, Hemovac drain:: Small amount of blood-tinged yellow synovial fluid No cellulitis present Compartment soft  LABS  Results for orders placed or performed during the hospital encounter of 06/25/21 (from the past 24 hour(s))  Body fluid culture w Gram Stain     Status: None (Preliminary result)   Collection Time: 06/26/21  2:07 PM   Specimen: PATH Cytology Misc. fluid; Body Fluid  Result Value Ref Range   Specimen Description      PLEURAL Performed at [facility], [address] [place], [place] [address]    Special Requests      NONE Performed at [facility], [place], [place] [address]    Gram Stain      ABUNDANT WBC PRESENT, PREDOMINANTLY PMN NO ORGANISMS SEEN    Culture      NO GROWTH < 24 HOURS Performed at [facility] [address], [place], [place] [address]    Report Status PENDING   Synovial cell count + diff, w/ crystals     Status: Abnormal   Collection Time: 06/26/21  2:18 PM  Result Value Ref Range   Color, Synovial ORANGE (A) YELLOW   Appearance-Synovial TURBID (A) CLEAR   Crystals, Fluid NO CRYSTALS SEEN    WBC, Synovial 65,108 (H) 0 - 200 /cu mm   Neutrophil, Synovial 92 %   Lymphocytes-Synovial Fld 8 %   Monocyte-Macrophage-Synovial Fluid 0 %    Eosinophils-Synovial 0 %   Other Cells-SYN 0   Glucose, capillary     Status: Abnormal   Collection Time: 06/26/21  7:43 PM  Result Value Ref Range   Glucose-Capillary 110 (H) 70 - 99 mg/dL   Comment 1 Notify RN   Glucose, capillary     Status: Abnormal   Collection Time: 06/27/21  7:48 AM  Result Value Ref Range   Glucose-Capillary 101 (H) 70 - 99 mg/dL   Comment 1 Notify RN    Comment 2 Document in Chart   Creatinine, serum     Status: None   Collection Time: 06/27/21  8:23 AM  Result Value Ref Range   Creatinine, Ser 0.82 0.61 - 1.24 mg/dL   GFR, Estimated >60 >60 mL/min  Glucose, capillary     Status: Abnormal   Collection Time: 06/27/21 11:46 AM  Result Value Ref Range   Glucose-Capillary 114 (H) 70 - 99 mg/dL   Comment 1 Notify RN    Comment 2 Document in Chart     No results found.  Assessment/Plan: 1 Day Post-Op   Principal Problem:   Septic joint of right knee joint (HCC) Active Problems:   Dyslipidemia   Degenerative arthritis of lumbar spine   Persistent atrial fibrillation (HCC)   Incomplete  tear of right rotator cuff   Sleep apnea   AF (paroxysmal atrial fibrillation) (HCC)   On continuous oral anticoagulation   Hypothyroidism   Benign essential HTN   Acquired thrombophilia ([place])   Septic arthritis ([place])  Patient stable postop.  Patient is afebrile.  Cell count is 66,000 from synovial sample from the OR yesterday.  Gram stain showing WBCs but no organisms.  Continue IV antibiotics per ID.  Patient will need a PICC line for IV antibiotics at home for at least 2 weeks.  Drain will be removed tomorrow.  Will recheck labs in the a.m.  No physical therapy will be ordered until the hemovac drain is removed tomorrow.    Thornton Park , MD 06/27/2021, 1:13 PM

## 2021-06-27 NOTE — Progress Notes (Addendum)
[place] INFECTIOUS DISEASE PROGRESS NOTE Date of Admission:  06/25/2021     ID: Alexander Carey is a 70 y.o. male with  septic joint post op Principal Problem:   Septic joint of right knee joint ([place]) Active Problems:   Dyslipidemia   Degenerative arthritis of lumbar spine   Persistent atrial fibrillation (HCC)   Incomplete tear of right rotator cuff   Sleep apnea   AF (paroxysmal atrial fibrillation) (HCC)   On continuous oral anticoagulation   Hypothyroidism   Benign essential HTN   Acquired thrombophilia (HCC)   Septic arthritis ([place])   Subjective: S/p repeat wash out. No fevers, still some pain  ROS  Eleven systems are reviewed and negative except per hpi  Medications:  Antibiotics Given (last 72 hours)     Date/Time Action Medication Dose Rate   06/25/21 1701 New Bag/Given   ceFEPIme (MAXIPIME) 2 g in sodium chloride 0.9 % 100 mL IVPB 2 g 200 mL/hr   06/25/21 1743 New Bag/Given   vancomycin (VANCOREADY) IVPB 2000 mg/400 mL 2,000 mg 200 mL/hr   06/26/21 0054 New Bag/Given   ceFEPIme (MAXIPIME) 2 g in sodium chloride 0.9 % 100 mL IVPB 2 g 200 mL/hr   06/26/21 0559 New Bag/Given   vancomycin (VANCOCIN) IVPB 1000 mg/200 mL premix 1,000 mg 200 mL/hr   06/26/21 1517 New Bag/Given   ceFEPIme (MAXIPIME) 2 g in sodium chloride 0.9 % 100 mL IVPB 2 g 200 mL/hr   06/26/21 1821 New Bag/Given   vancomycin (VANCOREADY) IVPB 1500 mg/300 mL 1,500 mg 150 mL/hr   06/26/21 2132 New Bag/Given   ceFEPIme (MAXIPIME) 2 g in sodium chloride 0.9 % 100 mL IVPB 2 g 200 mL/hr   06/27/21 0517 New Bag/Given   ceFEPIme (MAXIPIME) 2 g in sodium chloride 0.9 % 100 mL IVPB 2 g 200 mL/hr   06/27/21 0602 New Bag/Given   vancomycin (VANCOREADY) IVPB 1500 mg/300 mL 1,500 mg 150 mL/hr       dutasteride  0.5 mg Oral Daily   levothyroxine  25 mcg Oral Q0600   losartan  50 mg Oral Daily   metoprolol tartrate  12.5 mg Oral BID   tamsulosin  0.4 mg Oral Daily    Objective: Vital signs in  last 24 hours: Temp:  [97.2 F (36.2 C)-98.6 F (37 C)] 98 F (36.7 C) (10/13 0742) Pulse Rate:  [68-90] 68 (10/13 0742) Resp:  [12-20] 18 (10/13 0742) BP: (90-161)/(61-82) 153/82 (10/13 0742) SpO2:  [93 %-100 %] 97 % (10/13 0742) Weight:  [99.8 kg] 99.8 kg (10/12 0954) Constitutional: He is oriented to person, place, and time. He appears well-developed and well-nourished. No distress.  HENT:  Mouth/Throat: Oropharynx is clear and moist. No oropharyngeal exudate.  Cardiovascular: Normal rate, regular rhythm and normal heart sounds. Exam reveals no gallop and no friction rub.  No murmur heard.  Pulmonary/Chest: Effort normal and breath sounds normal. No respiratory distress. He has no wheezes.  Abdominal: Soft. Bowel sounds are normal. He exhibits no distension. There is no tenderness. Ext R knee wrapped post op. Drain in place Lymphadenopathy: He has no cervical adenopathy.  Neurological: He is alert and oriented to person, place, and time.  Skin: Skin is warm and dry. No rash noted. No erythema.  Psychiatric: He has a normal mood and affect. His behavior is normal.   Lab Results Recent Labs    06/25/21 1614 06/25/21 1623 06/25/21 1623 06/26/21 0539 06/27/21 0823  WBC 7.1  --   --  5.9  --   HGB 12.6*  --   --  11.4*  --   HCT 34.4*  --   --  32.2*  --   NA  --  132*  --  132*  --   K  --  4.2  --  4.2  --   CL  --  99  --  102  --   CO2  --  25  --  24  --   BUN  --  13  --  10  --   CREATININE  --  0.96   < > 0.84 0.82   < > = values in this interval not displayed.    Microbiology: Results for orders placed or performed during the hospital encounter of 06/25/21  Resp Panel by RT-PCR (Flu A&B, Covid) Nasopharyngeal Swab     Status: None   Collection Time: 06/25/21  5:45 PM   Specimen: Nasopharyngeal Swab; Nasopharyngeal(NP) swabs in vial transport medium  Result Value Ref Range Status   SARS Coronavirus 2 by RT PCR NEGATIVE NEGATIVE Final    Comment:  (NOTE) SARS-CoV-2 target nucleic acids are NOT DETECTED.  The SARS-CoV-2 RNA is generally detectable in upper respiratory specimens during the acute phase of infection. The lowest concentration of SARS-CoV-2 viral copies this assay can detect is 138 copies/mL. A negative result does not preclude SARS-Cov-2 infection and should not be used as the sole basis for treatment or other patient management decisions. A negative result may occur with  improper specimen collection/handling, submission of specimen other than nasopharyngeal swab, presence of viral mutation(s) within the areas targeted by this assay, and inadequate number of viral copies(<138 copies/mL). A negative result must be combined with clinical observations, patient history, and epidemiological information. The expected result is Negative.  Fact Sheet for Patients:  EntrepreneurPulse.com.au  Fact Sheet for Healthcare Providers:  IncredibleEmployment.be  This test is no t yet approved or cleared by the [place] FDA and  has been authorized for detection and/or diagnosis of SARS-CoV-2 by FDA under an Emergency Use Authorization (EUA). This EUA will remain  in effect (meaning this test can be used) for the duration of the COVID-19 declaration under Section 564(b)(1) of the Act, 21 U.S.C.section 360bbb-3(b)(1), unless the authorization is terminated  or revoked sooner.       Influenza A by PCR NEGATIVE NEGATIVE Final   Influenza B by PCR NEGATIVE NEGATIVE Final    Comment: (NOTE) The Xpert Xpress SARS-CoV-2/FLU/RSV plus assay is intended as an aid in the diagnosis of influenza from Nasopharyngeal swab specimens and should not be used as a sole basis for treatment. Nasal washings and aspirates are unacceptable for Xpert Xpress SARS-CoV-2/FLU/RSV testing.  Fact Sheet for Patients: EntrepreneurPulse.com.au  Fact Sheet for Healthcare  Providers: IncredibleEmployment.be  This test is not yet approved or cleared by the [place] FDA and has been authorized for detection and/or diagnosis of SARS-CoV-2 by FDA under an Emergency Use Authorization (EUA). This EUA will remain in effect (meaning this test can be used) for the duration of the COVID-19 declaration under Section 564(b)(1) of the Act, 21 U.S.C. section 360bbb-3(b)(1), unless the authorization is terminated or revoked.  Performed at [facility], [address] [place], [place] [address]   Body fluid culture w Gram Stain     Status: None (Preliminary result)   Collection Time: 06/26/21  2:07 PM   Specimen: PATH Cytology Misc. fluid; Body Fluid  Result Value Ref Range Status  Specimen Description   Final    PLEURAL Performed at [facility], [address], [place], [place] [address]    Special Requests   Final    NONE Performed at [facility], [address] [place], [place] [address]    Gram Stain   Final    ABUNDANT WBC PRESENT, PREDOMINANTLY PMN NO ORGANISMS SEEN Performed at [facility], [place] [address] [place], [place] [address]    Culture PENDING  Incomplete   Report Status PENDING  Incomplete    Studies/Results: No results found.  Assessment/Plan: ESAU FRIDMAN is a 70 y.o. male with recurrent R knee infection post op despite recent wash out and oral keflex and bactrim since prior to last surgery. WBC on fluid was 44k and 97% Pmns. Cxs neg but was on oral abx at time.  ESR is now quite high 70 CRP 7  10/13  s/p washout 10/12. Fluid wbc 66 K, 92% PMN. GS with abundant wbc, no organisms   Recommendations Place PICC Line Will start with 2 weeks IV abx but likely will need longer.  Will need broad coverage for culture negative septic joint as was on bactrim and keflex at time of cultures. Preference would be daptomycin and ceftriaxone. May need longer course of abx but hopefully  can change to orals at 2 week mark.  Thank you very much for the consult. Will follow with you.  Leonel Ramsay   06/27/2021, 9:49 AM

## 2021-06-28 DIAGNOSIS — E039 Hypothyroidism, unspecified: Secondary | ICD-10-CM

## 2021-06-28 LAB — CBC
HCT: 35.9 % — ABNORMAL LOW (ref 39.0–52.0)
Hemoglobin: 12.6 g/dL — ABNORMAL LOW (ref 13.0–17.0)
MCH: 36.8 pg — ABNORMAL HIGH (ref 26.0–34.0)
MCHC: 35.1 g/dL (ref 30.0–36.0)
MCV: 105 fL — ABNORMAL HIGH (ref 80.0–100.0)
Platelets: 283 10*3/uL (ref 150–400)
RBC: 3.42 MIL/uL — ABNORMAL LOW (ref 4.22–5.81)
RDW: 11.7 % (ref 11.5–15.5)
WBC: 6.9 10*3/uL (ref 4.0–10.5)
nRBC: 0 % (ref 0.0–0.2)

## 2021-06-28 LAB — GLUCOSE, CAPILLARY
Glucose-Capillary: 110 mg/dL — ABNORMAL HIGH (ref 70–99)
Glucose-Capillary: 126 mg/dL — ABNORMAL HIGH (ref 70–99)
Glucose-Capillary: 158 mg/dL — ABNORMAL HIGH (ref 70–99)

## 2021-06-28 LAB — CK: Total CK: 29 U/L — ABNORMAL LOW (ref 49–397)

## 2021-06-28 LAB — C-REACTIVE PROTEIN: CRP: 5.5 mg/dL — ABNORMAL HIGH (ref <1.0)

## 2021-06-28 LAB — SEDIMENTATION RATE: Sed Rate: 54 mm/hr — ABNORMAL HIGH (ref 0–20)

## 2021-06-28 MED ORDER — POLYETHYLENE GLYCOL 3350 17 G PO PACK
17.0000 g | PACK | Freq: Two times a day (BID) | ORAL | Status: DC
  Administered 2021-06-28 – 2021-06-29 (×2): 17 g via ORAL
  Filled 2021-06-28 (×2): qty 1

## 2021-06-28 MED ORDER — SENNA 8.6 MG PO TABS
1.0000 | ORAL_TABLET | Freq: Every day | ORAL | Status: DC
  Administered 2021-06-28: 8.6 mg via ORAL
  Filled 2021-06-28: qty 1

## 2021-06-28 MED ORDER — SODIUM CHLORIDE 0.9 % IV SOLN
8.0000 mg/kg | Freq: Every day | INTRAVENOUS | Status: DC
  Administered 2021-06-28 – 2021-06-29 (×2): 700 mg via INTRAVENOUS
  Filled 2021-06-28 (×2): qty 14

## 2021-06-28 NOTE — Progress Notes (Signed)
PHARMACY CONSULT NOTE FOR:  OUTPATIENT  PARENTERAL ANTIBIOTIC THERAPY (OPAT)  Indication: septic knee Regimen: daptomycin 700mg  IV q24h and Ceftriaxone 2gm IV q24h End date: 07/11/2021  IV antibiotic discharge orders are pended. To discharging provider:  please sign these orders via discharge navigator,  Select New Orders & click on the button choice - Manage This Unsigned Work.     Thank you for allowing pharmacy to be a part of this patient's care.  Doreene Eland, PharmD, BCPS.   Work Cell: [phone] 06/28/2021 8:08 AM

## 2021-06-28 NOTE — TOC Progression Note (Signed)
Transition of Care [facility]) - Progression Note    Patient Details  Name: Alexander Carey MRN: [id number] Date of Birth: 1951/06/26  Transition of Care [facility]) CM/SW [place], RN Phone Number: 06/28/2021, 3:55 PM  Clinical Narrative:   [facility] Day Surgery at [facility] [phone] has scheduled 2 appointments for patient to return for PICC dressing changes and for labs.  They are 10/20 and 10/27 at 1PM.  Patient and family given information for follow up.    Expected Discharge Plan: [place] Barriers to Discharge: Continued Medical Work up  Expected Discharge Plan and Services Expected Discharge Plan: [place]   Discharge Planning Services: CM Consult Post Acute Care Choice: [place] (Anticipated home with IV antibiotics) Living arrangements for the past 2 months: Single Family Home                 DME Arranged:  (no DME recommended)         HH Arranged: Higher education careers adviser spoke with at [place]: Carolynn Sayers notified for IV antibiotics,   Social Determinants of Health (SDOH) Interventions    Readmission Risk Interventions No flowsheet data found.

## 2021-06-28 NOTE — TOC Progression Note (Signed)
Transition of Care [facility]) - Progression Note    Patient Details  Name: Alexander Carey MRN: [id number] Date of Birth: 06-02-1951  Transition of Care [facility]) CM/SW [place], RN Phone Number: 06/28/2021, 10:49 AM  Clinical Narrative:   Patient discharging with antibiotic therapy at home, Carolynn Sayers aware.  Patient is unable to get Home Health due to Insurance concerns, ID pharmacist consulted for outpatient therapy.  Patient has no further TOC concerns. TOC will follow for needs.    Expected Discharge Plan: [place] Barriers to Discharge: Continued Medical Work up  Expected Discharge Plan and Services Expected Discharge Plan: [place]   Discharge Planning Services: CM Consult Post Acute Care Choice: [place] (Anticipated home with IV antibiotics) Living arrangements for the past 2 months: Single Family Home                 DME Arranged:  (no DME recommended)         HH Arranged: Higher education careers adviser spoke with at [place]: Carolynn Sayers notified for IV antibiotics,   Social Determinants of Health (SDOH) Interventions    Readmission Risk Interventions No flowsheet data found.

## 2021-06-28 NOTE — Progress Notes (Signed)
[place] INFECTIOUS DISEASE PROGRESS NOTE Date of Admission:  06/25/2021     ID: BARRY FAIRCLOTH is a 70 y.o. male with  septic joint post op Principal Problem:   Septic joint of right knee joint ([place]) Active Problems:   Dyslipidemia   Degenerative arthritis of lumbar spine   Persistent atrial fibrillation (HCC)   Incomplete tear of right rotator cuff   Sleep apnea   On continuous oral anticoagulation   Hypothyroidism   Benign essential HTN   Acquired thrombophilia (HCC)   Septic arthritis ([place])   Subjective: S/p repeat wash out. No fevers, still some pain  ROS  Eleven systems are reviewed and negative except per hpi  Medications:  Antibiotics Given (last 72 hours)     Date/Time Action Medication Dose Rate   06/25/21 1701 New Bag/Given   ceFEPIme (MAXIPIME) 2 g in sodium chloride 0.9 % 100 mL IVPB 2 g 200 mL/hr   06/25/21 1743 New Bag/Given   vancomycin (VANCOREADY) IVPB 2000 mg/400 mL 2,000 mg 200 mL/hr   06/26/21 0054 New Bag/Given   ceFEPIme (MAXIPIME) 2 g in sodium chloride 0.9 % 100 mL IVPB 2 g 200 mL/hr   06/26/21 0559 New Bag/Given   vancomycin (VANCOCIN) IVPB 1000 mg/200 mL premix 1,000 mg 200 mL/hr   06/26/21 7672 New Bag/Given   ceFEPIme (MAXIPIME) 2 g in sodium chloride 0.9 % 100 mL IVPB 2 g 200 mL/hr   06/26/21 1821 New Bag/Given   vancomycin (VANCOREADY) IVPB 1500 mg/300 mL 1,500 mg 150 mL/hr   06/26/21 2132 New Bag/Given   ceFEPIme (MAXIPIME) 2 g in sodium chloride 0.9 % 100 mL IVPB 2 g 200 mL/hr   06/27/21 0517 New Bag/Given   ceFEPIme (MAXIPIME) 2 g in sodium chloride 0.9 % 100 mL IVPB 2 g 200 mL/hr   06/27/21 0602 New Bag/Given   vancomycin (VANCOREADY) IVPB 1500 mg/300 mL 1,500 mg 150 mL/hr   06/27/21 1443 New Bag/Given   ceFEPIme (MAXIPIME) 2 g in sodium chloride 0.9 % 100 mL IVPB 2 g 200 mL/hr   06/27/21 2012 New Bag/Given   DAPTOmycin (CUBICIN) 700 mg in sodium chloride 0.9 % IVPB 700 mg 128 mL/hr   06/27/21 2154 New Bag/Given   ceFEPIme  (MAXIPIME) 2 g in sodium chloride 0.9 % 100 mL IVPB 2 g 200 mL/hr   06/28/21 0854 New Bag/Given   cefTRIAXone (ROCEPHIN) 2 g in sodium chloride 0.9 % 100 mL IVPB 2 g 200 mL/hr       apixaban  5 mg Oral BID   Chlorhexidine Gluconate Cloth  6 each Topical Daily   dutasteride  0.5 mg Oral Daily   levothyroxine  25 mcg Oral Q0600   losartan  50 mg Oral Daily   metoprolol tartrate  12.5 mg Oral BID   sodium chloride flush  10-40 mL Intracatheter Q12H   tamsulosin  0.4 mg Oral Daily    Objective: Vital signs in last 24 hours: Temp:  [97.9 F (36.6 C)-98.5 F (36.9 C)] 98.2 F (36.8 C) (10/14 1146) Pulse Rate:  [64-91] 64 (10/14 1146) Resp:  [16-20] 18 (10/14 1146) BP: (127-146)/(75-81) 127/78 (10/14 1146) SpO2:  [98 %-100 %] 100 % (10/14 1146) Constitutional: He is oriented to person, place, and time. He appears well-developed and well-nourished. No distress.  HENT:  Mouth/Throat: Oropharynx is clear and moist. No oropharyngeal exudate.  Cardiovascular: Normal rate, regular rhythm and normal heart sounds. Exam reveals no gallop and no friction rub.  No murmur heard.  Pulmonary/Chest: Effort normal and breath sounds normal. No respiratory distress. He has no wheezes.  Abdominal: Soft. Bowel sounds are normal. He exhibits no distension. There is no tenderness. Ext R knee wrapped post op. Drain in place Lymphadenopathy: He has no cervical adenopathy.  Neurological: He is alert and oriented to person, place, and time.  Skin: Skin is warm and dry. No rash noted. No erythema.  Psychiatric: He has a normal mood and affect. His behavior is normal.   Lab Results Recent Labs    06/25/21 1623 06/26/21 0539 06/27/21 0823 06/28/21 0538  WBC  --  5.9  --  6.9  HGB  --  11.4*  --  12.6*  HCT  --  32.2*  --  35.9*  NA 132* 132*  --   --   K 4.2 4.2  --   --   CL 99 102  --   --   CO2 25 24  --   --   BUN 13 10  --   --   CREATININE 0.96 0.84 0.82  --     Microbiology: Results for  orders placed or performed during the hospital encounter of 06/25/21  Resp Panel by RT-PCR (Flu A&B, Covid) Nasopharyngeal Swab     Status: None   Collection Time: 06/25/21  5:45 PM   Specimen: Nasopharyngeal Swab; Nasopharyngeal(NP) swabs in vial transport medium  Result Value Ref Range Status   SARS Coronavirus 2 by RT PCR NEGATIVE NEGATIVE Final    Comment: (NOTE) SARS-CoV-2 target nucleic acids are NOT DETECTED.  The SARS-CoV-2 RNA is generally detectable in upper respiratory specimens during the acute phase of infection. The lowest concentration of SARS-CoV-2 viral copies this assay can detect is 138 copies/mL. A negative result does not preclude SARS-Cov-2 infection and should not be used as the sole basis for treatment or other patient management decisions. A negative result may occur with  improper specimen collection/handling, submission of specimen other than nasopharyngeal swab, presence of viral mutation(s) within the areas targeted by this assay, and inadequate number of viral copies(<138 copies/mL). A negative result must be combined with clinical observations, patient history, and epidemiological information. The expected result is Negative.  Fact Sheet for Patients:  EntrepreneurPulse.com.au  Fact Sheet for Healthcare Providers:  IncredibleEmployment.be  This test is no t yet approved or cleared by the [place] FDA and  has been authorized for detection and/or diagnosis of SARS-CoV-2 by FDA under an Emergency Use Authorization (EUA). This EUA will remain  in effect (meaning this test can be used) for the duration of the COVID-19 declaration under Section 564(b)(1) of the Act, 21 U.S.C.section 360bbb-3(b)(1), unless the authorization is terminated  or revoked sooner.       Influenza A by PCR NEGATIVE NEGATIVE Final   Influenza B by PCR NEGATIVE NEGATIVE Final    Comment: (NOTE) The Xpert Xpress SARS-CoV-2/FLU/RSV plus  assay is intended as an aid in the diagnosis of influenza from Nasopharyngeal swab specimens and should not be used as a sole basis for treatment. Nasal washings and aspirates are unacceptable for Xpert Xpress SARS-CoV-2/FLU/RSV testing.  Fact Sheet for Patients: EntrepreneurPulse.com.au  Fact Sheet for Healthcare Providers: IncredibleEmployment.be  This test is not yet approved or cleared by the [place] FDA and has been authorized for detection and/or diagnosis of SARS-CoV-2 by FDA under an Emergency Use Authorization (EUA). This EUA will remain in effect (meaning this test can be used) for the duration of the COVID-19 declaration under Section 564(b)(1)  of the Act, 21 U.S.C. section 360bbb-3(b)(1), unless the authorization is terminated or revoked.  Performed at [facility], [address] [place], [place] [address]   Body fluid culture w Gram Stain     Status: None (Preliminary result)   Collection Time: 06/26/21  2:07 PM   Specimen: PATH Cytology Misc. fluid; Body Fluid  Result Value Ref Range Status   Specimen Description   Final    PLEURAL Performed at [facility], [address] [place], [place] [address]    Special Requests   Final    NONE Performed at [facility], [address] [place], [place] [address]    Gram Stain   Final    ABUNDANT WBC PRESENT, PREDOMINANTLY PMN NO ORGANISMS SEEN    Culture   Final    NO GROWTH 2 DAYS Performed at [facility], [place] [address] [place], [place] [address]    Report Status PENDING  Incomplete    Studies/Results: [place] EKG SITE RITE  Result Date: 06/27/2021 If Site Rite image not attached, placement could not be confirmed due to current cardiac rhythm.   Assessment/Plan: JOFFREY KERCE is a 70 y.o. male with recurrent R knee infection post op despite recent wash out and oral keflex and bactrim since prior to last surgery. WBC on fluid was  44k and 97% Pmns. Cxs neg but was on oral abx at time.  ESR is now quite high 70 CRP 7  10/13  s/p washout 10/12. Fluid wbc 66 K, 92% PMN. GS with abundant wbc, no organisms  10/14 - doing well. Picc plcaed. Tolerating dapto   Recommendations Plan >  2 weeks IV abx dapto and ceftriaxone Will need broad coverage for culture negative septic joint as was on bactrim and keflex at time of cultures. May need longer course of abx but hopefully can change to orals at 2 week mark.  Thank you very much for the consult. Will follow with you.  Leonel Ramsay   06/28/2021, 1:37 PM

## 2021-06-28 NOTE — TOC Progression Note (Signed)
Transition of Care [facility]) - Progression Note    Patient Details  Name: Alexander Carey MRN: [id number] Date of Birth: 1951-09-11  Transition of Care [facility]) CM/SW [place], RN Phone Number: 06/28/2021, 2:20 PM  Clinical Narrative:    patient requires continued medical workup today, as drain still has discharge.  It is anticipated that patient will discharge tomorrow or Sunday after daptomycin dose.  Carolynn Sayers aware to follow for discharge phone [phone].  Please contact pam chandler prior to discharge.    Expected Discharge Plan: [place] Barriers to Discharge: Continued Medical Work up  Expected Discharge Plan and Services Expected Discharge Plan: [place]   Discharge Planning Services: CM Consult Post Acute Care Choice: [place] (Anticipated home with IV antibiotics) Living arrangements for the past 2 months: Single Family Home                 DME Arranged:  (no DME recommended)         HH Arranged: Higher education careers adviser spoke with at [place]: Carolynn Sayers notified for IV antibiotics,   Social Determinants of Health (SDOH) Interventions    Readmission Risk Interventions No flowsheet data found.

## 2021-06-28 NOTE — Assessment & Plan Note (Signed)
--   Stable, continue metoprolol, apixaban

## 2021-06-28 NOTE — Progress Notes (Signed)
  Progress Note    Alexander Carey   SHF:[id number]  DOB: May 09, 1951  DOA: 06/25/2021     2 Date of Service: 06/28/2021   Clinical Course 70 year old man PMH including atrial fibrillation on apixaban, with recent complicated orthopedic history with a right knee abscess presented as a direct admission for recurrent right knee infection. --Seen by infectious disease and orthopedics --10/12 status post right knee revision arthroscopic irrigation debridement --10/13 feeling a bit better, no new events --10/14 feeling better.  Plan is to remove drain today.  PICC line placed.  Possibly home later today if cleared by orthopedics.  Infectious disease recommended ceftriaxone and daptomycin.  Assessment and Plan * Septic joint of right knee joint ([place]) -- Status post surgery 10/12.  Continue empiric antibiotics per infectious disease.  Home on IV antibiotics when cleared by orthopedics. -- Stable today.  Likely to have drain removed today and then therapy.  Acquired thrombophilia ([place]) -- Secondary to atrial fibrillation.  Continue apixaban  Persistent atrial fibrillation (HCC) -- Remains stable, will continue metoprolol, apixaban restarted as per orthopedics  Benign essential HTN -- Stable.  Continue Cozaar and metoprolol  Hypothyroidism -- Continue levothyroxine   Subjective:  Feeling better today  Objective Vitals:   06/27/21 2015 06/28/21 0356 06/28/21 0751 06/28/21 1146  BP: (!) 146/81 132/75 137/79 127/78  Pulse: 91 70 75 64  Resp: 20 18 18 18   Temp: 98.5 F (36.9 C)  97.9 F (36.6 C) 98.2 F (36.8 C)  TempSrc: Oral     SpO2: 98% 98% 98% 100%  Weight:      Height:       99.8 kg  Vital signs were reviewed and unremarkable.   Exam Physical Exam Constitutional:      General: He is not in acute distress.    Appearance: He is not ill-appearing or toxic-appearing.  Cardiovascular:     Rate and Rhythm: Normal rate and regular rhythm.  Pulmonary:     Effort:  Pulmonary effort is normal. No respiratory distress.     Breath sounds: No wheezing, rhonchi or rales.  Musculoskeletal:     Right lower leg: No edema.     Left lower leg: No edema.  Neurological:     Mental Status: He is alert.  Psychiatric:        Mood and Affect: Mood normal.        Behavior: Behavior normal.    Labs / Other Information My review of labs, imaging, notes and other tests is significant for     Hemoglobin stable, WBC within normal limits  Disposition Plan: Status is: Inpatient  Remains inpatient appropriate because: Receiving treatment for right knee infection, drain still in place, once drain removed and cleared by orthopedics, can discharge home  Time spent: 25 minutes [facility] 06/28/2021, 12:05 PM

## 2021-06-28 NOTE — Hospital Course (Addendum)
70 year old man PMH including atrial fibrillation on apixaban, with recent complicated orthopedic history with a right knee abscess presented as a direct admission for recurrent right knee infection. --Seen by infectious disease and orthopedics --10/12 status post right knee revision arthroscopic irrigation debridement --10/13 feeling a bit better, no new events --10/14 feeling better.  Plan is to remove drain today.  PICC line placed.  Possibly home later today if cleared by orthopedics.  Infectious disease recommended ceftriaxone and daptomycin. --10/15 feels good, cleared by Dr Mack Guise for discharge today

## 2021-06-28 NOTE — Progress Notes (Signed)
Subjective:  POD #2 s/p right knee arthroscopic irrigation and debridement.   Patient reports right knee pain as mild to moderate.  Patient's wife is at the bedside.  Patient is up out of bed to a chair.  Objective:   VITALS:   Vitals:   06/27/21 2015 06/28/21 0356 06/28/21 0751 06/28/21 1146  BP: (!) 146/81 132/75 137/79 127/78  Pulse: 91 70 75 64  Resp: 20 18 18 18   Temp: 98.5 F (36.9 C)  97.9 F (36.6 C) 98.2 F (36.8 C)  TempSrc: Oral     SpO2: 98% 98% 98% 100%  Weight:      Height:        PHYSICAL EXAM: Right lower extremity: Neurovascular intact Sensation intact distally Intact pulses distally Dorsiflexion/Plantar flexion intact No cellulitis present Compartment soft  LABS  Results for orders placed or performed during the hospital encounter of 06/25/21 (from the past 24 hour(s))  Glucose, capillary     Status: Abnormal   Collection Time: 06/27/21  4:33 PM  Result Value Ref Range   Glucose-Capillary 133 (H) 70 - 99 mg/dL   Comment 1 Notify RN    Comment 2 Document in Chart   Glucose, capillary     Status: Abnormal   Collection Time: 06/27/21  8:16 PM  Result Value Ref Range   Glucose-Capillary 121 (H) 70 - 99 mg/dL   Comment 1 Notify RN   CBC     Status: Abnormal   Collection Time: 06/28/21  5:38 AM  Result Value Ref Range   WBC 6.9 4.0 - 10.5 K/uL   RBC 3.42 (L) 4.22 - 5.81 MIL/uL   Hemoglobin 12.6 (L) 13.0 - 17.0 g/dL   HCT 35.9 (L) 39.0 - 52.0 %   MCV 105.0 (H) 80.0 - 100.0 fL   MCH 36.8 (H) 26.0 - 34.0 pg   MCHC 35.1 30.0 - 36.0 g/dL   RDW 11.7 11.5 - 15.5 %   Platelets 283 150 - 400 K/uL   nRBC 0.0 0.0 - 0.2 %  Sedimentation rate     Status: Abnormal   Collection Time: 06/28/21  5:38 AM  Result Value Ref Range   Sed Rate 54 (H) 0 - 20 mm/hr  C-reactive protein     Status: Abnormal   Collection Time: 06/28/21  5:38 AM  Result Value Ref Range   CRP 5.5 (H) <1.0 mg/dL  CK     Status: Abnormal   Collection Time: 06/28/21  5:38 AM  Result  Value Ref Range   Total CK 29 (L) 49 - 397 U/L  Glucose, capillary     Status: Abnormal   Collection Time: 06/28/21  7:53 AM  Result Value Ref Range   Glucose-Capillary 110 (H) 70 - 99 mg/dL  Glucose, capillary     Status: Abnormal   Collection Time: 06/28/21 11:51 AM  Result Value Ref Range   Glucose-Capillary 126 (H) 70 - 99 mg/dL    [place] EKG SITE RITE  Result Date: 06/27/2021 If Site Rite image not attached, placement could not be confirmed due to current cardiac rhythm.   Assessment/Plan: 2 Days Post-Op   Principal Problem:   Septic joint of right knee joint (HCC) Active Problems:   Dyslipidemia   Degenerative arthritis of lumbar spine   Persistent atrial fibrillation (HCC)   Incomplete tear of right rotator cuff   Sleep apnea   On continuous oral anticoagulation   Hypothyroidism   Benign essential HTN   Acquired thrombophilia ([place])  Septic arthritis [facility])  Patient currently on daptomycin and cefepime.  Patient had 25 cc of fluid emptied from his drain around noon time.  I would recommend the patient stay overnight for continued monitoring of drain output.  Patient will continue on his current IV antibiotics.  Patient is going to be set up with home infusion which he and his wife will be taught how to do.  The antibiotics will be delivered to his house this evening for weekend dosing.  Patient will be reevaluated tomorrow to determine if he is ready for discharge home based on his drain output.  Patient will follow-up in my office at the end of next week for reevaluation.    Thornton Park , MD 06/28/2021, 2:17 PM

## 2021-06-28 NOTE — Progress Notes (Deleted)
d 

## 2021-06-29 DIAGNOSIS — Z7901 Long term (current) use of anticoagulants: Secondary | ICD-10-CM

## 2021-06-29 MED ORDER — CEFTRIAXONE IV (FOR PTA / DISCHARGE USE ONLY): 2.0000 g | INTRAVENOUS | 0 refills | Status: AC

## 2021-06-29 MED ORDER — ACETAMINOPHEN ER 650 MG PO TBCR: 650.0000 mg | EXTENDED_RELEASE_TABLET | ORAL | Status: AC | PRN

## 2021-06-29 MED ORDER — DAPTOMYCIN IV (FOR PTA / DISCHARGE USE ONLY): 700.0000 mg | INTRAVENOUS | 0 refills | Status: AC

## 2021-06-29 NOTE — Progress Notes (Addendum)
  Subjective:  POD #3 s/p arthroscopic irrigation and debridement of right knee.   Patient reports right knee pain as mild.  Patient denies fevers or chills.  Objective:   VITALS:   Vitals:   06/28/21 1146 06/28/21 1529 06/28/21 1950 06/29/21 0442  BP: 127/78 102/64 122/62 127/69  Pulse: 64 84 72 72  Resp: _0 Temp: 98.2 F (36.8 C) 97.9 F (36.6 C) 98.1 F (36.7 C) 98.2 F (36.8 C)  TempSrc:  Oral    SpO2: 100% 100% 97% 98%  Weight:      Height:        PHYSICAL EXAM: Right lower extremity: I personally removed the patient's Hemovac drain today.  There has been no output since noon time yesterday. Neurovascular intact Sensation intact distally Intact pulses distally Dorsiflexion/Plantar flexion intact Incision: scant serous drainage from the lateral portal after drain removal No cellulitis present Compartment soft  LABS  Results for orders placed or performed during the hospital encounter of 06/25/21 (from the past 24 hour(s))  Glucose, capillary     Status: Abnormal   Collection Time: 06/28/21 11:51 AM  Result Value Ref Range   Glucose-Capillary 126 (H) 70 - 99 mg/dL  Glucose, capillary     Status: Abnormal   Collection Time: 06/28/21  3:34 PM  Result Value Ref Range   Glucose-Capillary 158 (H) 70 - 99 mg/dL    [place] EKG SITE RITE  Result Date: 06/27/2021 If Site Rite image not attached, placement could not be confirmed due to current cardiac rhythm.   Assessment/Plan: 3 Days Post-Op   Principal Problem:   Septic joint of right knee joint (HCC) Active Problems:   Dyslipidemia   Degenerative arthritis of lumbar spine   Persistent atrial fibrillation (HCC)   Incomplete tear of right rotator cuff   Sleep apnea   On continuous oral anticoagulation   Hypothyroidism   Benign essential HTN   Acquired thrombophilia ([place])   Septic arthritis ([place])  Patient clinically is improving.  His ESR and CRP values are improving.  He has a normal white count.   He has not having any fevers or chills.  He did not have any significant erythema over the right knee and there was no large knee effusion.  Patient will receive his dose of cefepime this morning and may be discharged home afterwards.  Patient has a PICC line and will continue daptomycin at home.  Patient will follow up with me in the office next Friday.  He should continue to elevate and ice his right knee whenever possible.  Patient will contact my office with any issues once he is discharged.  Patient may be discharged today from an orthopedic standpoint once he is cleared medically.  Patient will continue his home Eliquis dose which will cover him for DVT prophylaxis.    Thornton Park , MD 06/29/2021, 8:17 AM

## 2021-06-29 NOTE — Discharge Summary (Signed)
Physician Discharge Summary   Patient name: Alexander Carey  Admit date:     06/25/2021  Discharge date: 06/29/2021  Discharge Physician: Murray Hodgkins   PCP: Valera Castle, MD   Recommendations at discharge:  Right knee infection  Discharge Diagnoses Principal Problem:   Septic joint of right knee joint [facility]) Active Problems:   Dyslipidemia   Degenerative arthritis of lumbar spine   Incomplete tear of right rotator cuff   Sleep apnea   On continuous oral anticoagulation   Septic arthritis ([place])   Persistent atrial fibrillation (HCC)   Acquired thrombophilia ([place])   Hypothyroidism   Benign essential HTN  Hospital Course   70 year old man PMH including atrial fibrillation on apixaban, with recent complicated orthopedic history with a right knee abscess presented as a direct admission for recurrent right knee infection. --Seen by infectious disease and orthopedics --10/12 status post right knee revision arthroscopic irrigation debridement --10/13 feeling a bit better, no new events --10/14 feeling better.  Plan is to remove drain today.  PICC line placed.  Possibly home later today if cleared by orthopedics.  Infectious disease recommended ceftriaxone and daptomycin. --10/15 feels good, cleared by Dr Mack Guise for discharge today  * Septic joint of right knee joint ([place]) -- Status post surgery 10/12.  Continue empiric antibiotics per ID --home today per orthopedics  Acquired thrombophilia ([place]) -- Secondary to atrial fibrillation.  Continue apixaban  Persistent atrial fibrillation (HCC) -- Remains stable, will continue metoprolol, apixaban   Benign essential HTN -- Stable.  Continue Cozaar and metoprolol  Hypothyroidism -- Continue levothyroxine    Procedures performed: as above   Condition at discharge: good  Exam Physical Exam Constitutional:      General: He is not in acute distress.    Appearance: He is not ill-appearing or toxic-appearing.   Cardiovascular:     Rate and Rhythm: Normal rate and regular rhythm.     Heart sounds: No murmur heard. Pulmonary:     Effort: Pulmonary effort is normal. No respiratory distress.     Breath sounds: No wheezing or rales.  Neurological:     Mental Status: He is alert.  Psychiatric:        Mood and Affect: Mood normal.        Behavior: Behavior normal.    Disposition: Home  Discharge time: less than 30 minutes.  Follow-up Information     Thornton Park, MD Follow up on 07/05/2021.   Specialty: Orthopedic Surgery Why: Keep appt Contact information: [place] [address] [phone]                 Allergies as of 06/29/2021       Reactions   Statins Other (See Comments)   Patient declines any statins   Ciprofloxacin Rash        Medication List     STOP taking these medications    cephALEXin 500 MG capsule Commonly known as: KEFLEX   sulfamethoxazole-trimethoprim 800-160 MG tablet Commonly known as: BACTRIM DS       TAKE these medications    acetaminophen 650 MG CR tablet Commonly known as: TYLENOL Take 1 tablet (650 mg total) by mouth as needed for pain (mild). What changed: reasons to take this   albuterol 108 (90 Base) MCG/ACT inhaler Commonly known as: VENTOLIN HFA Inhale 1-2 puffs into the lungs every 6 (six) hours as needed for wheezing or shortness of breath.   cefTRIAXone  IVPB Commonly known as: ROCEPHIN  Inject 2 g into the vein daily for 13 days. Indication:  septic knee First Dose: Yes Last Day of Therapy:  07/11/2021 Labs - Once weekly:  CBC/D, SCr, LFTs, CRP and CPK FAX weekly labs to [phone] Method of administration: IV Push Method of administration may be changed at the discretion of home infusion pharmacist based upon assessment of the patient and/or caregiver's ability to self-administer the medication ordered.   daptomycin  IVPB Commonly known as: CUBICIN Inject 700 mg into the vein daily for  13 days. Indication:  septic knee First Dose: Yes Last Day of Therapy:  07/11/2021 Labs - Once weekly:  CBC/D, SCr, LFTs, CRP and CPK FAX weekly labs to [phone] Method of administration: IV Push Method of administration may be changed at the discretion of home infusion pharmacist based upon assessment of the patient and/or caregiver's ability to self-administer the medication ordered.   dutasteride 0.5 MG capsule Commonly known as: AVODART Take 1 capsule (0.5 mg total) by mouth daily.   Eliquis 5 MG Tabs tablet Generic drug: apixaban TAKE (1) TABLET BY MOUTH TWICE DAILY   HYDROcodone-acetaminophen 5-325 MG tablet Commonly known as: NORCO/VICODIN Take 1 tablet by mouth every 4 (four) hours as needed for moderate pain.   Ipratropium-Albuterol 20-100 MCG/ACT Aers respimat Commonly known as: COMBIVENT Inhale 1 puff into the lungs every 6 (six) hours as needed for wheezing or shortness of breath.   levothyroxine 25 MCG tablet Commonly known as: SYNTHROID Take 1 tablet (25 mcg total) by mouth daily before breakfast.   losartan 50 MG tablet Commonly known as: COZAAR Take 1 tablet (50 mg total) by mouth daily.   metoprolol tartrate 25 MG tablet Commonly known as: LOPRESSOR Take 0.5 tablets (12.5 mg total) by mouth 2 (two) times daily.   tamsulosin 0.4 MG Caps capsule Commonly known as: FLOMAX Take 1 capsule (0.4 mg total) by mouth daily.               Discharge Care Instructions  (From admission, onward)           Start     Ordered   06/29/21 0000  Change dressing on IV access line weekly and PRN  (Home infusion instructions - [facility] )        06/29/21 0832   06/29/21 0000  Discharge wound care:       Comments: --as per Dr. Mack Guise, keep covered   06/29/21 1144            [place] EKG SITE RITE  Result Date: 06/27/2021 If Site Rite image not attached, placement could not be confirmed due to current cardiac rhythm.  Results for orders  placed or performed during the hospital encounter of 06/25/21  Resp Panel by RT-PCR (Flu A&B, Covid) Nasopharyngeal Swab     Status: None   Collection Time: 06/25/21  5:45 PM   Specimen: Nasopharyngeal Swab; Nasopharyngeal(NP) swabs in vial transport medium  Result Value Ref Range Status   SARS Coronavirus 2 by RT PCR NEGATIVE NEGATIVE Final    Comment: (NOTE) SARS-CoV-2 target nucleic acids are NOT DETECTED.  The SARS-CoV-2 RNA is generally detectable in upper respiratory specimens during the acute phase of infection. The lowest concentration of SARS-CoV-2 viral copies this assay can detect is 138 copies/mL. A negative result does not preclude SARS-Cov-2 infection and should not be used as the sole basis for treatment or other patient management decisions. A negative result may occur with  improper specimen collection/handling, submission of specimen other  than nasopharyngeal swab, presence of viral mutation(s) within the areas targeted by this assay, and inadequate number of viral copies(<138 copies/mL). A negative result must be combined with clinical observations, patient history, and epidemiological information. The expected result is Negative.  Fact Sheet for Patients:  EntrepreneurPulse.com.au  Fact Sheet for Healthcare Providers:  IncredibleEmployment.be  This test is no t yet approved or cleared by the [place] FDA and  has been authorized for detection and/or diagnosis of SARS-CoV-2 by FDA under an Emergency Use Authorization (EUA). This EUA will remain  in effect (meaning this test can be used) for the duration of the COVID-19 declaration under Section 564(b)(1) of the Act, 21 U.S.C.section 360bbb-3(b)(1), unless the authorization is terminated  or revoked sooner.       Influenza A by PCR NEGATIVE NEGATIVE Final   Influenza B by PCR NEGATIVE NEGATIVE Final    Comment: (NOTE) The Xpert Xpress SARS-CoV-2/FLU/RSV plus assay is  intended as an aid in the diagnosis of influenza from Nasopharyngeal swab specimens and should not be used as a sole basis for treatment. Nasal washings and aspirates are unacceptable for Xpert Xpress SARS-CoV-2/FLU/RSV testing.  Fact Sheet for Patients: EntrepreneurPulse.com.au  Fact Sheet for Healthcare Providers: IncredibleEmployment.be  This test is not yet approved or cleared by the [place] FDA and has been authorized for detection and/or diagnosis of SARS-CoV-2 by FDA under an Emergency Use Authorization (EUA). This EUA will remain in effect (meaning this test can be used) for the duration of the COVID-19 declaration under Section 564(b)(1) of the Act, 21 U.S.C. section 360bbb-3(b)(1), unless the authorization is terminated or revoked.  Performed at [facility], [address] [place], [place] [address]   Body fluid culture w Gram Stain     Status: None (Preliminary result)   Collection Time: 06/26/21  2:07 PM   Specimen: PATH Cytology Misc. fluid; Body Fluid  Result Value Ref Range Status   Specimen Description   Final    PLEURAL Performed at [facility], [address] [place], [place] [address]    Special Requests   Final    NONE Performed at [facility], [address] [place], [place] [address]    Gram Stain   Final    ABUNDANT WBC PRESENT, PREDOMINANTLY PMN NO ORGANISMS SEEN    Culture   Final    NO GROWTH 3 DAYS Performed at [facility], [address], [place], [place] [address]    Report Status PENDING  Incomplete    Signed:  Murray Hodgkins MD.  [facility] 06/29/2021, 3:03 PM

## 2021-06-29 NOTE — Evaluation (Signed)
Physical Therapy Evaluation Patient Details Name: Alexander Carey MRN: [id number] DOB: 13-Mar-1951 Today's Date: 06/29/2021  History of Present Illness  Pt admitted for septic joint of R knee and is POD 3 for I&D. Drain removed and PT consulted for home safety evaluation. History includes HTN, Afib, and recent Sx of partial medial/lateral meniscectomies on 05/30/21.  Clinical Impression  Pt is a pleasant 70 year old male who was admitted for R septic joint and is POD 3 s/p I&D. Pt performs transfers and ambulation with mod I and RW. Safe technique with stair training. Pt demonstrates deficits with pain/mobility. Would benefit from skilled PT to address above deficits and promote optimal return to PLOF. Currently recommending OP PT for follow up to improve gait mechanics and fade out use of RW. Plans for dc to home today if cleared by care team.     Recommendations for follow up therapy are one component of a multi-disciplinary discharge planning process, led by the attending physician.  Recommendations may be updated based on patient status, additional functional criteria and insurance authorization.  Follow Up Recommendations Outpatient PT    Equipment Recommendations  None recommended by PT    Recommendations for Other Services       Precautions / Restrictions Precautions Precautions: None Restrictions Weight Bearing Restrictions: Yes RLE Weight Bearing: Weight bearing as tolerated      Mobility  Bed Mobility               General bed mobility comments: not performed as received seated in recliner    Transfers Overall transfer level: Needs assistance Equipment used: Rolling walker (2 wheeled) Transfers: Sit to/from Stand Sit to Stand: Modified independent (Device/Increase time)         General transfer comment: cues for pushing from seated surface. Once standing, upright posture noted. RW used  Ambulation/Gait Ambulation/Gait assistance: Modified independent  (Device/Increase time) Gait Distance (Feet): 150 Feet Assistive device: Rolling walker (2 wheeled) Gait Pattern/deviations: Step-to pattern;Decreased stance time - right     General Gait Details: cues for fluid ambulation and foot flat vs to toe. Safe technique with RW used. Slight increase in pain with ambulation  Stairs Stairs: Yes Stairs assistance: Min guard Stair Management: One rail Right;Step to pattern Number of Stairs: 4 General stair comments: demonstrated prior to performance. Safe technique  Wheelchair Mobility    Modified Rankin (Stroke Patients Only)       Balance Overall balance assessment: Modified Independent                                           Pertinent Vitals/Pain Pain Assessment: 0-10 Pain Score: 3  Pain Location: R knee Pain Descriptors / Indicators: Discomfort;Tightness;Sore Pain Intervention(s): Limited activity within patient's tolerance;Premedicated before session;Repositioned    Home Living Family/patient expects to be discharged to:: Private residence Living Arrangements: Spouse/significant other Available Help at Discharge: Family;Available 24 hours/day Type of Home: House Home Access: Stairs to enter Entrance Stairs-Rails: Right Entrance Stairs-Number of Steps: 3 Home Layout: One level Home Equipment: Walker - 2 wheels;Cane - single point;Tub bench      Prior Function Level of Independence: Independent with assistive device(s)         Comments: has been ambulatory using RW for past 2 weeks     Hand Dominance   Dominant Hand: Right    Extremity/Trunk Assessment   Upper Extremity Assessment Upper  Extremity Assessment: Overall WFL for tasks assessed    Lower Extremity Assessment Lower Extremity Assessment:  (R LE grossly 3+/5; L LE grossly 5/5) RLE Sensation: WNL       Communication   Communication: HOH  Cognition Arousal/Alertness: Awake/alert Behavior During Therapy: WFL for tasks  assessed/performed Overall Cognitive Status: Within Functional Limits for tasks assessed                                        General Comments      Exercises Other Exercises Other Exercises: educated on seated ther-ex to prevent swelling.   Assessment/Plan    PT Assessment Patient needs continued PT services  PT Problem List Decreased mobility;Pain       PT Treatment Interventions Gait training;Therapeutic exercise    PT Goals (Current goals can be found in the Care Plan section)  Acute Rehab PT Goals Patient Stated Goal: to return home this pm PT Goal Formulation: With patient/family Time For Goal Achievement: 07/13/21 Potential to Achieve Goals: Good    Frequency Min 2X/week   Barriers to discharge        Co-evaluation               AM-PAC PT "6 Clicks" Mobility  Outcome Measure Help needed turning from your back to your side while in a flat bed without using bedrails?: None Help needed moving from lying on your back to sitting on the side of a flat bed without using bedrails?: None Help needed moving to and from a bed to a chair (including a wheelchair)?: None Help needed standing up from a chair using your arms (e.g., wheelchair or bedside chair)?: A Little Help needed to walk in hospital room?: None Help needed climbing 3-5 steps with a railing? : A Little 6 Click Score: 22    End of Session Equipment Utilized During Treatment: Gait belt Activity Tolerance: Patient tolerated treatment well Patient left: in chair Nurse Communication: Mobility status PT Visit Diagnosis: Pain Pain - Right/Left: Right Pain - part of body: Knee    Time: 1601-0932 PT Time Calculation (min) (ACUTE ONLY): 17 min   Charges:   PT Evaluation $PT Eval Low Complexity: 1 Low PT Treatments $Gait Training: 8-22 mins        Greggory Stallion, PT, DPT [phone]   Alexander Carey 06/29/2021, 10:39 AM

## 2021-06-29 NOTE — TOC Transition Note (Signed)
Transition of Care [facility]) - CM/SW Discharge Note   Patient Details  Name: Alexander Carey MRN: [id number] Date of Birth: 20-Feb-1951  Transition of Care [facility] CM/SW Contact:  Alberteen Sam, LCSW Phone Number: 06/29/2021, 12:18 PM   Clinical Narrative:     Patient to discharge home today, has [facility], Carolynn Sayers informed of patient's discharge today. She reports meds are already in the home, patient is outpatient for PICC care/labs, and teaching has been done.   No other discharge needs identified at this time.    Final next level of care: Home/Self Care Barriers to Discharge: No Barriers Identified   Patient Goals and CMS Choice Patient states their goals for this hospitalization and ongoing recovery are:: To have my foot heal soon. CMS Medicare.gov Compare Post Acute Care list provided to:: Patient Choice offered to / list presented to : Patient  Discharge Placement                       Discharge Plan and Services   Discharge Planning Services: CM Consult Post Acute Care Choice: Home Health (Anticipated home with IV antibiotics)          DME Arranged:  (no DME recommended)         HH Arranged: RN       Representative spoke with at [place]: Carolynn Sayers notified for IV antibiotics,  Social Determinants of Health (SDOH) Interventions     Readmission Risk Interventions No flowsheet data found.

## 2021-06-29 NOTE — Plan of Care (Signed)
Patient discharged home per MD orders at this time.All discharge instructions,education and medications reviewed with patient at the bedside.Pt expressed understanding and will comply with dc instructions.follow up appointments was also communicated to Pt.no verbal c/o or any ssx of distress.patient discharged home with HH/home infusions(IV abx) and PT/OT per order.patient was transported home by spouse in a private car.

## 2021-06-30 LAB — BODY FLUID CULTURE W GRAM STAIN: Culture: NO GROWTH

## 2021-07-04 ENCOUNTER — Ambulatory Visit
Admit: 2021-07-04 | Discharge: 2021-07-04 | Disposition: A | Discharge status: Home / Self Care | Payer: PPO | Source: Ambulatory Visit | Attending: Infectious Diseases | Admitting: Infectious Diseases

## 2021-07-04 ENCOUNTER — Other Ambulatory Visit: Payer: Self-pay

## 2021-07-04 VITALS — BP 123/71 | HR 73 | Temp 97.1°F | Resp 18

## 2021-07-04 DIAGNOSIS — M009 Pyogenic arthritis, unspecified: Secondary | ICD-10-CM | POA: Diagnosis not present

## 2021-07-04 LAB — CBC WITH DIFFERENTIAL/PLATELET
Abs Immature Granulocytes: 0.04 10*3/uL (ref 0.00–0.07)
Basophils Absolute: 0.1 10*3/uL (ref 0.0–0.1)
Basophils Relative: 1 %
Eosinophils Absolute: 0.1 10*3/uL (ref 0.0–0.5)
Eosinophils Relative: 1 %
HCT: 33.1 % — ABNORMAL LOW (ref 39.0–52.0)
Hemoglobin: 11.8 g/dL — ABNORMAL LOW (ref 13.0–17.0)
Immature Granulocytes: 1 %
Lymphocytes Relative: 12 %
Lymphs Abs: 1 10*3/uL (ref 0.7–4.0)
MCH: 37.1 pg — ABNORMAL HIGH (ref 26.0–34.0)
MCHC: 35.6 g/dL (ref 30.0–36.0)
MCV: 104.1 fL — ABNORMAL HIGH (ref 80.0–100.0)
Monocytes Absolute: 0.9 10*3/uL (ref 0.1–1.0)
Monocytes Relative: 11 %
Neutro Abs: 5.9 10*3/uL (ref 1.7–7.7)
Neutrophils Relative %: 74 %
Platelets: 286 10*3/uL (ref 150–400)
RBC: 3.18 MIL/uL — ABNORMAL LOW (ref 4.22–5.81)
RDW: 11.3 % — ABNORMAL LOW (ref 11.5–15.5)
WBC: 7.9 10*3/uL (ref 4.0–10.5)
nRBC: 0 % (ref 0.0–0.2)

## 2021-07-04 LAB — HEPATIC FUNCTION PANEL
ALT: 20 U/L (ref 0–44)
AST: 17 U/L (ref 15–41)
Albumin: 2.8 g/dL — ABNORMAL LOW (ref 3.5–5.0)
Alkaline Phosphatase: 67 U/L (ref 38–126)
Bilirubin, Direct: <0.1 mg/dL (ref 0.0–0.2)
Total Bilirubin: 0.2 mg/dL — ABNORMAL LOW (ref 0.3–1.2)
Total Protein: 6.1 g/dL — ABNORMAL LOW (ref 6.5–8.1)

## 2021-07-04 LAB — CK: Total CK: 27 U/L — ABNORMAL LOW (ref 49–397)

## 2021-07-04 LAB — C-REACTIVE PROTEIN: CRP: 8.3 mg/dL — ABNORMAL HIGH (ref <1.0)

## 2021-07-04 LAB — CREATININE, SERUM
Creatinine, Ser: 0.64 mg/dL (ref 0.61–1.24)
GFR, Estimated: >60 mL/min (ref >60)

## 2021-07-04 MED ORDER — HEPARIN SOD (PORK) LOCK FLUSH 100 UNIT/ML IV SOLN
INTRAVENOUS | Status: AC
  Administered 2021-07-04: 250 [IU]
  Filled 2021-07-04: qty 5

## 2021-07-04 MED ORDER — HEPARIN SOD (PORK) LOCK FLUSH 100 UNIT/ML IV SOLN
250.0000 [IU] | INTRAVENOUS | Status: AC | PRN
Start: 2021-07-04 — End: 2021-07-04

## 2021-07-04 NOTE — Progress Notes (Signed)
Peripherally Inserted Central Catheter  \Medical day blood draw and PICC dressing change, patient tolerated well.   Indication for Insertion or Continuance of Line Home intravenous therapies (PICC only) 07/04/21 1400  Exposed Catheter (cm) 0 cm 06/27/21 1635  Site Assessment Clean;Dry;Intact 07/04/21 1400  Line Status Flushed;Saline locked 07/04/21 1400  Dressing Type Transparent 07/04/21 1400  Dressing Status Clean;Dry;Intact 07/04/21 1400  Antimicrobial disc in place? Yes 07/04/21 1400  Safety Lock Placed 07/04/21 1400  Line Care Connections checked and tightened 07/04/21 1400      Dressing Intervention New dressing;Antimicrobial disc changed;Securement device changed 07/04/21 1400  Dressing Change Due 07/04/21 07/11/21 [place] 07/04/2021, 2:35 PM

## 2021-07-05 DIAGNOSIS — M25461 Effusion, right knee: Secondary | ICD-10-CM | POA: Diagnosis not present

## 2021-07-06 DIAGNOSIS — M009 Pyogenic arthritis, unspecified: Secondary | ICD-10-CM | POA: Diagnosis not present

## 2021-07-09 ENCOUNTER — Ambulatory Visit (HOSPITAL_COMMUNITY): Payer: PPO | Admitting: Nurse Practitioner

## 2021-07-10 DIAGNOSIS — I480 Paroxysmal atrial fibrillation: Secondary | ICD-10-CM | POA: Diagnosis not present

## 2021-07-10 DIAGNOSIS — Z792 Long term (current) use of antibiotics: Secondary | ICD-10-CM | POA: Diagnosis not present

## 2021-07-10 DIAGNOSIS — M009 Pyogenic arthritis, unspecified: Secondary | ICD-10-CM | POA: Diagnosis not present

## 2021-07-11 ENCOUNTER — Ambulatory Visit (HOSPITAL_COMMUNITY): Payer: PPO | Admitting: Nurse Practitioner

## 2021-07-11 ENCOUNTER — Ambulatory Visit
Admission: RE | Admit: 2021-07-11 | Discharge: 2021-07-11 | Disposition: A | Discharge status: Home / Self Care | Payer: Self-pay | Source: Ambulatory Visit | Attending: Infectious Diseases | Admitting: Infectious Diseases

## 2021-07-11 ENCOUNTER — Ambulatory Visit
Admission: RE | Admit: 2021-07-11 | Discharge: 2021-07-11 | Disposition: A | Discharge status: Home / Self Care | Payer: PPO | Source: Ambulatory Visit | Attending: Infectious Diseases | Admitting: Infectious Diseases

## 2021-07-11 VITALS — BP 116/70 | HR 77 | Temp 97.9°F | Resp 18

## 2021-07-11 DIAGNOSIS — M009 Pyogenic arthritis, unspecified: Secondary | ICD-10-CM | POA: Diagnosis not present

## 2021-07-11 LAB — HEPATIC FUNCTION PANEL
ALT: 22 U/L (ref 0–44)
AST: 21 U/L (ref 15–41)
Albumin: 3.2 g/dL — ABNORMAL LOW (ref 3.5–5.0)
Alkaline Phosphatase: 70 U/L (ref 38–126)
Bilirubin, Direct: <0.1 mg/dL (ref 0.0–0.2)
Total Bilirubin: 0.6 mg/dL (ref 0.3–1.2)
Total Protein: 6.3 g/dL — ABNORMAL LOW (ref 6.5–8.1)

## 2021-07-11 LAB — CBC WITH DIFFERENTIAL/PLATELET
Abs Immature Granulocytes: 0.03 10*3/uL (ref 0.00–0.07)
Basophils Absolute: 0 10*3/uL (ref 0.0–0.1)
Basophils Relative: 0 %
Eosinophils Absolute: 0.2 10*3/uL (ref 0.0–0.5)
Eosinophils Relative: 3 %
HCT: 34.5 % — ABNORMAL LOW (ref 39.0–52.0)
Hemoglobin: 12.3 g/dL — ABNORMAL LOW (ref 13.0–17.0)
Immature Granulocytes: 1 %
Lymphocytes Relative: 22 %
Lymphs Abs: 1.4 10*3/uL (ref 0.7–4.0)
MCH: 36.8 pg — ABNORMAL HIGH (ref 26.0–34.0)
MCHC: 35.7 g/dL (ref 30.0–36.0)
MCV: 103.3 fL — ABNORMAL HIGH (ref 80.0–100.0)
Monocytes Absolute: 0.5 10*3/uL (ref 0.1–1.0)
Monocytes Relative: 8 %
Neutro Abs: 4.2 10*3/uL (ref 1.7–7.7)
Neutrophils Relative %: 66 %
Platelets: 277 10*3/uL (ref 150–400)
RBC: 3.34 MIL/uL — ABNORMAL LOW (ref 4.22–5.81)
RDW: 11.5 % (ref 11.5–15.5)
WBC: 6.4 10*3/uL (ref 4.0–10.5)
nRBC: 0 % (ref 0.0–0.2)

## 2021-07-11 LAB — C-REACTIVE PROTEIN: CRP: 0.8 mg/dL (ref <1.0)

## 2021-07-11 LAB — CK: Total CK: 57 U/L (ref 49–397)

## 2021-07-11 LAB — CREATININE, SERUM
Creatinine, Ser: 0.73 mg/dL (ref 0.61–1.24)
GFR, Estimated: >60 mL/min (ref >60)

## 2021-07-11 MED ORDER — HEPARIN SOD (PORK) LOCK FLUSH 100 UNIT/ML IV SOLN: 250.0000 [IU] | INTRAVENOUS | Status: DC | PRN

## 2021-07-11 MED ORDER — HEPARIN SOD (PORK) LOCK FLUSH 100 UNIT/ML IV SOLN
INTRAVENOUS | Status: AC
  Filled 2021-07-11: qty 5

## 2021-07-15 ENCOUNTER — Other Ambulatory Visit: Payer: Self-pay | Admitting: *Deleted

## 2021-07-15 DIAGNOSIS — M009 Pyogenic arthritis, unspecified: Secondary | ICD-10-CM

## 2021-07-18 ENCOUNTER — Ambulatory Visit
Admission: RE | Admit: 2021-07-18 | Discharge: 2021-07-18 | Disposition: A | Discharge status: Home / Self Care | Payer: PPO | Source: Ambulatory Visit | Attending: Infectious Diseases | Admitting: Infectious Diseases

## 2021-07-18 DIAGNOSIS — M009 Pyogenic arthritis, unspecified: Secondary | ICD-10-CM | POA: Diagnosis not present

## 2021-07-18 LAB — CBC WITH DIFFERENTIAL/PLATELET
Abs Immature Granulocytes: 0.03 10*3/uL (ref 0.00–0.07)
Basophils Absolute: 0 10*3/uL (ref 0.0–0.1)
Basophils Relative: 1 %
Eosinophils Absolute: 0.2 10*3/uL (ref 0.0–0.5)
Eosinophils Relative: 3 %
HCT: 36.1 % — ABNORMAL LOW (ref 39.0–52.0)
Hemoglobin: 12.4 g/dL — ABNORMAL LOW (ref 13.0–17.0)
Immature Granulocytes: 1 %
Lymphocytes Relative: 17 %
Lymphs Abs: 1.1 10*3/uL (ref 0.7–4.0)
MCH: 35.6 pg — ABNORMAL HIGH (ref 26.0–34.0)
MCHC: 34.3 g/dL (ref 30.0–36.0)
MCV: 103.7 fL — ABNORMAL HIGH (ref 80.0–100.0)
Monocytes Absolute: 0.6 10*3/uL (ref 0.1–1.0)
Monocytes Relative: 10 %
Neutro Abs: 4.5 10*3/uL (ref 1.7–7.7)
Neutrophils Relative %: 68 %
Platelets: 232 10*3/uL (ref 150–400)
RBC: 3.48 MIL/uL — ABNORMAL LOW (ref 4.22–5.81)
RDW: 12.1 % (ref 11.5–15.5)
WBC: 6.5 10*3/uL (ref 4.0–10.5)
nRBC: 0 % (ref 0.0–0.2)

## 2021-07-18 LAB — HEPATIC FUNCTION PANEL
ALT: 20 U/L (ref 0–44)
AST: 20 U/L (ref 15–41)
Albumin: 3.6 g/dL (ref 3.5–5.0)
Alkaline Phosphatase: 80 U/L (ref 38–126)
Bilirubin, Direct: <0.1 mg/dL (ref 0.0–0.2)
Total Bilirubin: 0.7 mg/dL (ref 0.3–1.2)
Total Protein: 6.6 g/dL (ref 6.5–8.1)

## 2021-07-18 LAB — C-REACTIVE PROTEIN: CRP: 0.6 mg/dL (ref <1.0)

## 2021-07-18 LAB — CK: Total CK: 44 U/L — ABNORMAL LOW (ref 49–397)

## 2021-07-18 LAB — CREATININE, SERUM
Creatinine, Ser: 0.66 mg/dL (ref 0.61–1.24)
GFR, Estimated: >60 mL/min (ref >60)

## 2021-07-18 MED ORDER — HEPARIN SOD (PORK) LOCK FLUSH 100 UNIT/ML IV SOLN: 250.0000 [IU] | Freq: Once | INTRAVENOUS | Status: AC

## 2021-07-18 MED ORDER — HEPARIN SOD (PORK) LOCK FLUSH 100 UNIT/ML IV SOLN
INTRAVENOUS | Status: AC
  Administered 2021-07-18: 250 [IU] via INTRAVENOUS
  Filled 2021-07-18: qty 5

## 2021-07-20 LAB — FUNGAL ORGANISM REFLEX

## 2021-07-20 LAB — FUNGUS CULTURE WITH STAIN

## 2021-07-20 LAB — FUNGUS CULTURE RESULT

## 2021-07-23 DIAGNOSIS — M00869 Arthritis due to other bacteria, unspecified knee: Secondary | ICD-10-CM | POA: Diagnosis not present

## 2021-07-23 DIAGNOSIS — E039 Hypothyroidism, unspecified: Secondary | ICD-10-CM | POA: Diagnosis not present

## 2021-07-23 DIAGNOSIS — E785 Hyperlipidemia, unspecified: Secondary | ICD-10-CM | POA: Diagnosis not present

## 2021-07-24 DIAGNOSIS — M00061 Staphylococcal arthritis, right knee: Secondary | ICD-10-CM | POA: Diagnosis not present

## 2021-07-24 DIAGNOSIS — Z792 Long term (current) use of antibiotics: Secondary | ICD-10-CM | POA: Diagnosis not present

## 2021-07-30 DIAGNOSIS — Z792 Long term (current) use of antibiotics: Secondary | ICD-10-CM | POA: Diagnosis not present

## 2021-07-30 DIAGNOSIS — M00061 Staphylococcal arthritis, right knee: Secondary | ICD-10-CM | POA: Diagnosis not present

## 2021-08-06 DIAGNOSIS — M009 Pyogenic arthritis, unspecified: Secondary | ICD-10-CM | POA: Diagnosis not present

## 2021-08-06 DIAGNOSIS — Z792 Long term (current) use of antibiotics: Secondary | ICD-10-CM | POA: Diagnosis not present

## 2021-08-06 DIAGNOSIS — M00061 Staphylococcal arthritis, right knee: Secondary | ICD-10-CM | POA: Diagnosis not present

## 2021-08-19 DIAGNOSIS — M1711 Unilateral primary osteoarthritis, right knee: Secondary | ICD-10-CM | POA: Diagnosis not present

## 2021-09-03 ENCOUNTER — Other Ambulatory Visit: Payer: Self-pay | Admitting: Internal Medicine

## 2021-09-03 DIAGNOSIS — I4819 Other persistent atrial fibrillation: Secondary | ICD-10-CM

## 2021-09-03 NOTE — Telephone Encounter (Signed)
Eliquis 5mg  refill request received. Patient is 70 years old, weight-99.8kg, Crea-0.66 on 07/18/2021, Diagnosis-Afib, and last seen by Dr. Rayann Heman on 01/07/2021. Dose is appropriate based on dosing criteria. Will send in refill to requested pharmacy.

## 2021-09-10 ENCOUNTER — Other Ambulatory Visit: Payer: Self-pay | Admitting: Urology

## 2021-10-03 ENCOUNTER — Telehealth: Payer: Self-pay | Admitting: Urology

## 2021-10-03 NOTE — Telephone Encounter (Signed)
Please call Alexander Carey and have him schedule for his annual visit.  He will need a PSA prior.

## 2021-10-03 NOTE — Telephone Encounter (Signed)
Left pt a voicemail to call in to get scheduled for his annual follow-up.

## 2021-10-08 ENCOUNTER — Other Ambulatory Visit: Payer: Self-pay | Admitting: Urology

## 2021-10-16 ENCOUNTER — Other Ambulatory Visit: Payer: Self-pay

## 2021-10-16 DIAGNOSIS — N138 Other obstructive and reflux uropathy: Secondary | ICD-10-CM

## 2021-10-17 ENCOUNTER — Ambulatory Visit: Payer: PPO | Admitting: Dermatology

## 2021-10-17 ENCOUNTER — Other Ambulatory Visit: Payer: Self-pay

## 2021-10-17 DIAGNOSIS — L570 Actinic keratosis: Secondary | ICD-10-CM | POA: Diagnosis not present

## 2021-10-17 DIAGNOSIS — R233 Spontaneous ecchymoses: Secondary | ICD-10-CM

## 2021-10-17 DIAGNOSIS — Z85828 Personal history of other malignant neoplasm of skin: Secondary | ICD-10-CM

## 2021-10-17 DIAGNOSIS — R21 Rash and other nonspecific skin eruption: Secondary | ICD-10-CM

## 2021-10-17 DIAGNOSIS — L308 Other specified dermatitis: Secondary | ICD-10-CM

## 2021-10-17 MED ORDER — TRIAMCINOLONE ACETONIDE 0.1 % EX OINT: TOPICAL_OINTMENT | CUTANEOUS | 0 refills | Status: DC

## 2021-10-17 NOTE — Patient Instructions (Addendum)
Wound Care Instructions  Cleanse wound gently with soap and water once a day then pat dry with clean gauze. Apply a thing coat of Petrolatum (petroleum jelly, "Vaseline") over the wound (unless you have an allergy to this). We recommend that you use a new, sterile tube of Vaseline. Do not pick or remove scabs. Do not remove the yellow or white "healing tissue" from the base of the wound.  Cover the wound with fresh, clean, nonstick gauze and secure with paper tape. You may use Band-Aids in place of gauze and tape if the would is small enough, but would recommend trimming much of the tape off as there is often too much. Sometimes Band-Aids can irritate the skin.  You should call the office for your biopsy report after 1 week if you have not already been contacted.  If you experience any problems, such as abnormal amounts of bleeding, swelling, significant bruising, significant pain, or evidence of infection, please call the office immediately.  FOR ADULT SURGERY PATIENTS: If you need something for pain relief you may take 1 extra strength Tylenol (acetaminophen) AND 2 Ibuprofen (200mg  each) together every 4 hours as needed for pain. (do not take these if you are allergic to them or if you have a reason you should not take them.) Typically, you may only need pain medication for 1 to 3 days.   Topical steroids (such as triamcinolone, fluocinolone, fluocinonide, mometasone, clobetasol, halobetasol, betamethasone, hydrocortisone) can cause thinning and lightening of the skin if they are used for too long in the same area. Your physician has selected the right strength medicine for your problem and area affected on the body. Please use your medication only as directed by your physician to prevent side effects.    If You Need Anything After Your Visit  If you have any questions or concerns for your doctor, please call our main line at [phone] and press option 4 to reach your doctor's medical  assistant. If no one answers, please leave a voicemail as directed and we will return your call as soon as possible. Messages left after 4 pm will be answered the following business day.   You may also send [place] a message via [place]. We typically respond to MyChart messages within 1-2 business days.  For prescription refills, please ask your pharmacy to contact our office. Our fax number is [phone].  If you have an urgent issue when the clinic is closed that cannot wait until the next business day, you can page your doctor at the number below.    Please note that while we do our best to be available for urgent issues outside of office hours, we are not available 24/7.   If you have an urgent issue and are unable to reach [place], you may choose to seek medical care at your doctor's office, retail clinic, urgent care center, or emergency room.  If you have a medical emergency, please immediately call 911 or go to the emergency department.  Pager Numbers  - Dr. Nehemiah Massed: [phone]  - Dr. Laurence Ferrari: [phone]  - Dr. Nicole Kindred: [phone]  In the event of inclement weather, please call our main line at [phone] for an update on the status of any delays or closures.  Dermatology Medication Tips: Please keep the boxes that topical medications come in in order to help keep track of the instructions about where and how to use these. Pharmacies typically print the medication instructions only on the boxes and not directly on the medication  tubes.   If your medication is too expensive, please contact our office at [phone] option 4 or send [place] a message through [place].   We are unable to tell what your co-pay for medications will be in advance as this is different depending on your insurance coverage. However, we may be able to find a substitute medication at lower cost or fill out paperwork to get insurance to cover a needed medication.   If a prior authorization is required to get your  medication covered by your insurance company, please allow [place] 1-2 business days to complete this process.  Drug prices often vary depending on where the prescription is filled and some pharmacies may offer cheaper prices.  The website www.goodrx.com contains coupons for medications through different pharmacies. The prices here do not account for what the cost may be with help from insurance (it may be cheaper with your insurance), but the website can give you the price if you did not use any insurance.  - You can print the associated coupon and take it with your prescription to the pharmacy.  - You may also stop by our office during regular business hours and pick up a GoodRx coupon card.  - If you need your prescription sent electronically to a different pharmacy, notify our office through [facility] or by phone at [phone] option 4.     Si Usted Necesita Algo Despus de Su Visita  Tambin puede enviarnos un mensaje a travs de Pharmacist, community. Por lo general respondemos a los mensajes de MyChart en el transcurso de 1 a 2 das hbiles.  Para renovar recetas, por favor pida a su farmacia que se ponga en contacto con nuestra oficina. Harland Dingwall de fax es [place] [phone].  Si tiene un asunto urgente cuando la clnica est cerrada y que no puede esperar hasta el siguiente da hbil, puede llamar/localizar a su doctor(a) al nmero que aparece a continuacin.   Por favor, tenga en cuenta que aunque hacemos todo lo posible para estar disponibles para asuntos urgentes fuera del horario de [place], no estamos disponibles las 24 horas del da, los 7 das de la [place].   Si tiene un problema urgente y no puede comunicarse con nosotros, puede optar por buscar atencin mdica  en el consultorio de su doctor(a), en una clnica privada, en un centro de atencin urgente o en una sala de emergencias.  Si tiene Engineering geologist, por favor llame inmediatamente al 911 o vaya a la sala de  emergencias.  Nmeros de bper  - Dr. Nehemiah Massed: [phone]  - Dra. Moye: [phone]  - Dra. Nicole Kindred: [phone]  En caso de inclemencias del [place], por favor llame a Johnsie Kindred principal al [phone] para una actualizacin sobre el [place] de cualquier retraso o cierre.  Consejos para la medicacin en dermatologa: Por favor, guarde las cajas en las que vienen los medicamentos de uso tpico para ayudarle a seguir las instrucciones sobre dnde y cmo usarlos. Las farmacias generalmente imprimen las instrucciones del medicamento slo en las cajas y no directamente en los tubos del [place].   Si su medicamento es muy caro, por favor, pngase en contacto con Zigmund Daniel llamando al [phone] y presione la opcin 4 o envenos un mensaje a travs de Pharmacist, community.   No podemos decirle cul ser su copago por los medicamentos por adelantado ya que esto es diferente dependiendo de la cobertura de su seguro. Sin embargo, es posible que podamos encontrar un medicamento sustituto a Scientist, water quality  o llenar un formulario para que el seguro cubra el medicamento que se considera necesario.   Si se requiere una autorizacin previa para que su compaa de seguros [place] su medicamento, por favor permtanos de 1 a 2 das hbiles para completar este proceso.  Los precios de los medicamentos varan con frecuencia dependiendo del Environmental consultant de dnde se surte la receta y alguna farmacias pueden ofrecer precios ms baratos.  El sitio web www.goodrx.com tiene cupones para medicamentos de Airline pilot. Los precios aqu no tienen en cuenta lo que podra costar con la ayuda del seguro (puede ser ms barato con su seguro), pero el sitio web puede darle el precio si no utiliz Research scientist (physical sciences).  - Puede imprimir el cupn correspondiente y llevarlo con su receta a la farmacia.  - Tambin puede pasar por nuestra oficina durante el horario de atencin regular y Charity fundraiser una tarjeta de cupones de GoodRx.  - Si  necesita que su receta se enve electrnicamente a una farmacia diferente, informe a nuestra oficina a travs de MyChart de [place] o por telfono llamando al [phone] y presione la opcin 4.

## 2021-10-17 NOTE — Progress Notes (Signed)
Follow-Up Visit   Subjective  Alexander Carey is a 71 y.o. male who presents for the following: Rash (On the back, buttocks, and thighs - started about 2-3 weeks ago and started spreading a few days ago. ). Patient did have a sinus infection around the same time that the rash started. He also had knee surgery in September and was on multiple new drugs which he finished on December 1st. No other new medications. Wife states she did change laundry detergent about 6 mths ago. Wife does not have a rash and they do have a male dog that lives in the home.   Wife accompanied patient today and contributes to history.   The following portions of the chart were reviewed this encounter and updated as appropriate:   Tobacco   Allergies   Meds   Problems   Med Hx   Surg Hx   Fam Hx       Review of Systems:  No other skin or systemic complaints except as noted in HPI or Assessment and Plan.  Objective  Well appearing patient in no apparent distress; mood and affect are within normal limits.  A focused examination was performed including the face,trunk and extremities. Relevant physical exam findings are noted in the Assessment and Plan.  R nasal supra tip x 1 Gritty pink papule with cutaneous horn. Base is very thin  Back, buttocks, thighs Blanching pink macules and papules on the low back and upper buttocks. Some petechia on the thighs. Scaly pink plaques at the ant plaques.              Right Lower Back               Assessment & Plan  AK (actinic keratosis) R nasal supra tip x 1  Recheck in 4wks to 4 mths.   Actinic keratoses are precancerous spots that appear secondary to cumulative UV radiation exposure/sun exposure over time. They are chronic with expected duration over 1 year. A portion of actinic keratoses will progress to squamous cell carcinoma of the skin. It is not possible to reliably predict which spots will progress to skin cancer and so treatment is  recommended to prevent development of skin cancer.  Recommend daily broad spectrum sunscreen SPF 30+ to sun-exposed areas, reapply every 2 hours as needed.  Recommend staying in the shade or wearing long sleeves, sun glasses (UVA+UVB protection) and wide brim hats (4-inch brim around the entire circumference of the hat). Call for new or changing lesions.  Prior to procedure, discussed risks of blister formation, small wound, skin dyspigmentation, or rare scar following cryotherapy. Recommend Vaseline ointment to treated areas while healing.   Destruction of lesion - R nasal supra tip x 1 Complexity: simple   Destruction method: cryotherapy   Informed consent: discussed and consent obtained   Timeout:  patient name, date of birth, surgical site, and procedure verified Lesion destroyed using liquid nitrogen: Yes   Region frozen until ice ball extended beyond lesion: Yes   Outcome: patient tolerated procedure well with no complications   Post-procedure details: wound care instructions given    Petechiae Back, buttocks, thighs  Vasculitis vs other -      Skin / nail biopsy - Back, buttocks, thighs Type of biopsy: punch   Informed consent: discussed and consent obtained   Timeout: patient name, date of birth, surgical site, and procedure verified   Procedure prep:  Patient was prepped and draped in usual sterile fashion (the  patient was cleaned and prepped) Prep type:  Isopropyl alcohol Anesthesia: the lesion was anesthetized in a standard fashion   Anesthetic:  1% lidocaine w/ epinephrine 1-100,000 buffered w/ 8.4% NaHCO3 Punch size:  3 mm Suture size:  4-0 Suture type: Prolene (polypropylene)   Hemostasis achieved with: suture, pressure and aluminum chloride   Outcome: patient tolerated procedure well   Post-procedure details: sterile dressing applied and wound care instructions given   Dressing type: bandage, petrolatum and pressure dressing    triamcinolone ointment  (KENALOG) 0.1 % - Back, buttocks, thighs Apply to aa's rash BID up to two weeks. Avoid applying to face, groin, and axilla. Use as directed. Long-term use can cause thinning of the skin.  Specimen 1 - Surgical pathology Differential Diagnosis: D48.5 r/o vasculitis vs other Check Margins: No  Rash and other nonspecific skin eruption Right Lower Back  Ddx viral, drug, vasculitis  Start TMC 0.1% ointment to aa's BID up to two weeks. Topical steroids (such as triamcinolone, fluocinolone, fluocinonide, mometasone, clobetasol, halobetasol, betamethasone, hydrocortisone) can cause thinning and lightening of the skin if they are used for too long in the same area. Your physician has selected the right strength medicine for your problem and area affected on the body. Please use your medication only as directed by your physician to prevent side effects.    History of Basal Cell Carcinoma of the Skin - nasal tip  - No evidence of recurrence today - Recommend regular full body skin exams - Recommend daily broad spectrum sunscreen SPF 30+ to sun-exposed areas, reapply every 2 hours as needed.  - Call if any new or changing lesions are noted between office visits  Return in about 2 weeks (around 10/31/2021) for suture removal; 4-8 wks for AK f/u, first available for FBSE.  Luther Redo, CMA, am acting as scribe for Forest Gleason, MD .  Documentation: I have reviewed the above documentation for accuracy and completeness, and I agree with the above.  Forest Gleason, MD

## 2021-10-22 ENCOUNTER — Other Ambulatory Visit: Payer: Self-pay

## 2021-10-22 ENCOUNTER — Other Ambulatory Visit: Payer: PPO

## 2021-10-22 DIAGNOSIS — N401 Enlarged prostate with lower urinary tract symptoms: Secondary | ICD-10-CM

## 2021-10-22 DIAGNOSIS — N138 Other obstructive and reflux uropathy: Secondary | ICD-10-CM

## 2021-10-23 ENCOUNTER — Telehealth: Payer: Self-pay

## 2021-10-23 LAB — PSA: Prostate Specific Ag, Serum: 0.2 ng/mL (ref 0.0–4.0)

## 2021-10-23 NOTE — Telephone Encounter (Signed)
-----   Message from [place], MD sent at 10/23/2021  1:17 PM EST ----- Skin , back, buttocks, thighs PERIVASCULAR DERMATITIS WITH EXTRAVASATED ERYTHROCYTES No evidence of vasculitis or inflammation of the blood vessels. Biopsy results consistent with viral rash or drug rash. Will review medications at f/u. Continue current treatment.  MAs please call. Thank you!

## 2021-10-23 NOTE — Telephone Encounter (Signed)
Discussed pathology results with patient's wife. [place] for suture removal. Plan to review medications at f/u visit. JP

## 2021-10-24 ENCOUNTER — Other Ambulatory Visit: Payer: Self-pay

## 2021-10-24 ENCOUNTER — Ambulatory Visit (HOSPITAL_COMMUNITY)
Admission: RE | Admit: 2021-10-24 | Discharge: 2021-10-24 | Disposition: A | Discharge status: Home / Self Care | Payer: PPO | Source: Ambulatory Visit | Attending: Nurse Practitioner | Admitting: Nurse Practitioner

## 2021-10-24 VITALS — BP 160/86 | HR 70 | Ht 72.0 in | Wt 224.2 lb

## 2021-10-24 DIAGNOSIS — Z7901 Long term (current) use of anticoagulants: Secondary | ICD-10-CM | POA: Diagnosis not present

## 2021-10-24 DIAGNOSIS — I4819 Other persistent atrial fibrillation: Secondary | ICD-10-CM | POA: Insufficient documentation

## 2021-10-24 DIAGNOSIS — Z79899 Other long term (current) drug therapy: Secondary | ICD-10-CM | POA: Diagnosis not present

## 2021-10-24 DIAGNOSIS — D6869 Other thrombophilia: Secondary | ICD-10-CM | POA: Diagnosis not present

## 2021-10-24 DIAGNOSIS — I10 Essential (primary) hypertension: Secondary | ICD-10-CM | POA: Insufficient documentation

## 2021-10-24 MED ORDER — LOSARTAN POTASSIUM 100 MG PO TABS: 100.0000 mg | ORAL_TABLET | Freq: Every day | ORAL | 3 refills | Status: DC

## 2021-10-24 NOTE — Patient Instructions (Signed)
Increase losartan to 100mg  once a day -- bring blood pressure log for follow up

## 2021-10-24 NOTE — Progress Notes (Addendum)
Primary Care Physician: Valera Castle, MD Referring Physician: Dr. Camille Bal is a 71 y.o. male with a h/o persistent afib with termination pauses, failing flecainide, Rythmol and currently on  Amiodarone. He is in the afib clinic for one month follow up from ablation. He is in SR. Has not noted any afib since the procedure.  No swallowing or groin  issues.  F/u in afib clinic 10/24/21. He is here for one year f/u. He has been off amiodarone since shortly after ablation and is staying in SR. However, he has noted BP's at hone running above 150 sys. It is 160/86 in the clinic today.   Today, he denies symptoms of palpitations, chest pain, shortness of breath, orthopnea, PND, lower extremity edema, dizziness, presyncope, syncope, or neurologic sequela. The patient is tolerating medications without difficulties and is otherwise without complaint today.   Past Medical History:  Diagnosis Date   AF (atrial fibrillation) (HCC)    Back pain    lower   Basal cell carcinoma 11/01/2019   nasal tip, right ear concha   ED (erectile dysfunction)    ED (erectile dysfunction)    Enlarged prostate    Essential hypertension    HOH (hard of hearing)    Hypogonadism in male    Hypothyroidism    Paroxysmal A-fib ([place])    a. 07/2018 s/p PVI.   Past Surgical History:  Procedure Laterality Date   ATRIAL FIBRILLATION ABLATION N/A 07/27/2018   Procedure: ATRIAL FIBRILLATION ABLATION;  Surgeon: Thompson Grayer, MD;  Location: [facility];  Service: Cardiovascular;  Laterality: N/A;   CARDIOVERSION N/A 01/13/2018   Procedure: CARDIOVERSION;  Surgeon: Nelva Bush, MD;  Location: [facility];  Service: Cardiovascular;  Laterality: N/A;   CARDIOVERSION N/A 01/26/2018   Procedure: CARDIOVERSION;  Surgeon: Minna Merritts, MD;  Location: [facility];  Service: Cardiovascular;  Laterality: N/A;   CARDIOVERSION N/A 03/01/2018   Procedure: CARDIOVERSION;  Surgeon: Minna Merritts, MD;   Location: [facility];  Service: Cardiovascular;  Laterality: N/A;   CARDIOVERSION N/A 05/21/2018   Procedure: CARDIOVERSION;  Surgeon: Wellington Hampshire, MD;  Location: [facility];  Service: Cardiovascular;  Laterality: N/A;   COLONOSCOPY  04/2005   COLONOSCOPY WITH PROPOFOL N/A 03/28/2020   Procedure: COLONOSCOPY WITH PROPOFOL;  Surgeon: Robert Bellow, MD;  Location: [facility];  Service: Endoscopy;  Laterality: N/A;   INCISION AND DRAINAGE Right 06/26/2021   Procedure: INCISION AND DRAINAGE;  Surgeon: Thornton Park, MD;  Location: [facility];  Service: Orthopedics;  Laterality: Right;   KNEE ARTHROSCOPY Right 06/20/2021   Procedure: RIGHT KNEE DIAGNOSTIC  ARTHROSCOPY;  Surgeon: Thornton Park, MD;  Location: [facility];  Service: Orthopedics;  Laterality: Right;   KNEE ARTHROSCOPY Right 06/26/2021   Procedure: ARTHROSCOPY KNEE;  Surgeon: Thornton Park, MD;  Location: [facility];  Service: Orthopedics;  Laterality: Right;   KNEE ARTHROSCOPY WITH MEDIAL MENISECTOMY Right 05/30/2021   Procedure: KNEE ARTHROSCOPY WITH MEDIAL AND LATERAL MENISECTOMIES AND CHONDROPLASTY OF THE PATELLA;  Surgeon: Thornton Park, MD;  Location: [place];  Service: Orthopedics;  Laterality: Right;   TONSILLECTOMY     TRIGGER FINGER RELEASE Left 12/03/2016   Procedure: 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     albuterol (VENTOLIN HFA) 108 (90 Base) MCG/ACT inhaler Inhale 1-2 puffs into the lungs every 6 (six) hours as needed for wheezing or shortness of breath.     dutasteride (AVODART) 0.5 MG capsule TAKE ONE (1) CAPSULE EACH DAY. 30 capsule 0   ELIQUIS 5 MG TABS tablet TAKE (1) TABLET BY MOUTH  TWICE DAILY 180 tablet 1   Ipratropium-Albuterol (COMBIVENT) 20-100 MCG/ACT AERS respimat Inhale 1 puff into the lungs every 6 (six) hours as needed for wheezing or shortness of breath.      levothyroxine (SYNTHROID, LEVOTHROID) 25 MCG tablet Take 1 tablet (25 mcg total) by mouth daily before breakfast. 30 tablet 1   losartan (COZAAR) 50 MG tablet Take 1 tablet (50 mg total) by mouth daily. 90 tablet 3   Meth-Hyo-M Bl-Na Phos-Ph Sal (URIBEL) 118 MG CAPS Take 1 capsule by mouth every morning.     metoprolol tartrate (LOPRESSOR) 25 MG tablet Take 0.5 tablets (12.5 mg total) by mouth 2 (two) times daily. 90 tablet 3   tamsulosin (FLOMAX) 0.4 MG CAPS capsule TAKE (1) CAPSULE BY MOUTH EVERY DAY 90 capsule 3   triamcinolone ointment (KENALOG) 0.1 % Apply to aa's rash BID up to two weeks. Avoid applying to face, groin, and axilla. Use as directed. Long-term use can cause thinning of the skin. 80 g 0   No current facility-administered medications for this encounter.    Allergies  Allergen Reactions   Statins Other (See Comments)    Patient declines any statins   Ciprofloxacin Rash    Social History   Socioeconomic History   Marital status: Married    Spouse name: Not on file   Number of children: Not on file   Years of education: Not on file   Highest education level: Not on file  Occupational History   Not on file  Tobacco Use   Smoking status: Former    Packs/day: 0.50    Types: Cigarettes    Quit date: 09/15/2008    Years since quitting: 13.1   Smokeless tobacco: Never  Vaping Use   Vaping Use: Never used  Substance and Sexual Activity   Alcohol use: Yes    Comment: on the weekend- has a few shots   Drug use: No   Sexual activity: Not on file  Other Topics Concern   Not on file  Social History Narrative   Lives in [place] with spouse   Owns rental property   Social Determinants of Health   Financial Resource Strain: Not on file  Food Insecurity: Not on file   Transportation Needs: Not on file  Physical Activity: Not on file  Stress: Not on file  Social Connections: Not on file  Intimate Partner Violence: Not on file    Family History  Problem Relation Age of Onset   Ovarian cancer Mother    Throat cancer Father    Prostate cancer Neg Hx    Kidney cancer Neg Hx     ROS- All systems are reviewed and negative except as per the HPI above  Physical Exam: Vitals:   10/24/21 1130  Weight: 101.7 kg  Height: 6' (1.829 m)   Wt Readings from Last 3 Encounters:  10/24/21 101.7 kg  06/26/21 99.8 kg  06/20/21 99.8 kg    Labs: Lab Results  Component Value Date   NA 132 (L) 06/26/2021   K 4.2 06/26/2021   CL 102 06/26/2021  CO2 24 06/26/2021   GLUCOSE 96 06/26/2021   BUN 10 06/26/2021   CREATININE 0.66 07/18/2021   CALCIUM 8.0 (L) 06/26/2021   Lab Results  Component Value Date   INR 0.97 10/29/2017   No results found for: CHOL, HDL, LDLCALC, TRIG   GEN- The patient is well appearing, alert and oriented x 3 today.   Head- normocephalic, atraumatic Eyes-  Sclera clear, conjunctiva pink Ears- hearing intact Oropharynx- clear Neck- supple, no JVP Lymph- no cervical lymphadenopathy Lungs- Clear to ausculation bilaterally, normal work of breathing Heart- Regular rate and rhythm, no murmurs, rubs or gallops, PMI not laterally displaced GI- soft, NT, ND, + BS Extremities- no clubbing, cyanosis, or edema MS- no significant deformity or atrophy Skin- no rash or lesion Psych- euthymic mood, full affect Neuro- strength and sensation are intact  EKG-Sinus rhythm at 70 bpm, pr int 198 ms, qrs 82 ms, qtc 416 ms    Assessment and Plan: 1.Persistent afib Doing very well s/p ablation without any afib, off amiodarone for many months  In SR today  Continue  eliquis 5 mg bid  with a chadsvasc score of 2  Continue  metoprolol tatrate 12.5 mg bid   2. HTN Elevated here and at home Increase losartan to 100 mg qd F/u in 2 weeks  with home BP readings, BP check and bmet   F/u with Dr. Rayann Heman  in May as scheduled   Geroge Baseman. Caramia Boutin, [facility] [address] [place], [place] [address] [phone]

## 2021-10-28 NOTE — Progress Notes (Signed)
02/14/20 8:14 AM   Alexander Carey [birth year] [id number]  Referring provider: [place], [place],   [id number]  Chief Complaint  Patient presents with   Benign Prostatic Hypertrophy   Urological History: 1. BPH with LU TS -PSA 0.2 in 10/2021 -I PSS: 6/1 -Taking dutasteride 0.5 mg daily and tamsulosin 0.4 mg daily  2. ED -Risk factors: former smoker, sleep apnea, BPH, age and anticoagulants  -SHIM 19 -managed with tadalafil 20 mg, on-demand-dosing  HPI: Alexander Carey is a 71 y.o. male who presents today for a yearly follow up.   He complains of weakness of stream.  Patient denies any modifying or aggravating factors.  Patient denies any gross hematuria, dysuria or suprapubic/flank pain.  Patient denies any fevers, chills, nausea or vomiting.     IPSS     Row Name 10/29/21 1500         International Prostate Symptom Score   How often have you had the sensation of not emptying your bladder? Less than 1 in 5     How often have you had to urinate less than every two hours? Less than 1 in 5 times     How often have you found you stopped and started again several times when you urinated? Less than 1 in 5 times     How often have you found it difficult to postpone urination? Not at All     How often have you had a weak urinary stream? Less than 1 in 5 times     How often have you had to strain to start urination? Less than 1 in 5 times     How many times did you typically get up at night to urinate? 1 Time     Total IPSS Score 6       Quality of Life due to urinary symptoms   If you were to spend the rest of your life with your urinary condition just the way it is now how would you feel about that? Pleased               Score:  1-7 Mild 8-19 Moderate 20-35 Severe   Patient still having spontaneous erections.  He denies any pain or curvature with erections.     SHIM     Row Name 10/29/21 1525         SHIM: Over the last  6 months:   How do you rate your confidence that you could get and keep an erection? Moderate     When you had erections with sexual stimulation, how often were your erections hard enough for penetration (entering your partner)? Sometimes (about half the time)     During sexual intercourse, how often were you able to maintain your erection after you had penetrated (entered) your partner? Most Times (much more than half the time)     During sexual intercourse, how difficult was it to maintain your erection to completion of intercourse? Not Difficult     When you attempted sexual intercourse, how often was it satisfactory for you? Most Times (much more than half the time)       SHIM Total Score   SHIM 19               Score: 1-7 Severe ED 8-11 Moderate ED 12-16 Mild-Moderate ED 17-21 Mild ED 22-25 No ED  PMH: Past Medical History:  Diagnosis Date   AF (atrial fibrillation) ([place])  Back pain    lower   Basal cell carcinoma 11/01/2019   nasal tip, right ear concha   ED (erectile dysfunction)    ED (erectile dysfunction)    Enlarged prostate    Essential hypertension    HOH (hard of hearing)    Hypogonadism in male    Hypothyroidism    Paroxysmal A-fib ([place])    a. 07/2018 s/p PVI.    Surgical History: Past Surgical History:  Procedure Laterality Date   ATRIAL FIBRILLATION ABLATION N/A 07/27/2018   Procedure: ATRIAL FIBRILLATION ABLATION;  Surgeon: Thompson Grayer, MD;  Location: [facility];  Service: Cardiovascular;  Laterality: N/A;   CARDIOVERSION N/A 01/13/2018   Procedure: CARDIOVERSION;  Surgeon: Nelva Bush, MD;  Location: [facility];  Service: Cardiovascular;  Laterality: N/A;   CARDIOVERSION N/A 01/26/2018   Procedure: CARDIOVERSION;  Surgeon: Minna Merritts, MD;  Location: [facility];  Service: Cardiovascular;  Laterality: N/A;   CARDIOVERSION N/A 03/01/2018   Procedure: CARDIOVERSION;  Surgeon: Minna Merritts, MD;  Location: [facility];  Service:  Cardiovascular;  Laterality: N/A;   CARDIOVERSION N/A 05/21/2018   Procedure: CARDIOVERSION;  Surgeon: Wellington Hampshire, MD;  Location: [facility];  Service: Cardiovascular;  Laterality: N/A;   COLONOSCOPY  04/2005   COLONOSCOPY WITH PROPOFOL N/A 03/28/2020   Procedure: COLONOSCOPY WITH PROPOFOL;  Surgeon: Robert Bellow, MD;  Location: [facility];  Service: Endoscopy;  Laterality: N/A;   INCISION AND DRAINAGE Right 06/26/2021   Procedure: INCISION AND DRAINAGE;  Surgeon: Thornton Park, MD;  Location: [facility];  Service: Orthopedics;  Laterality: Right;   KNEE ARTHROSCOPY Right 06/20/2021   Procedure: RIGHT KNEE DIAGNOSTIC  ARTHROSCOPY;  Surgeon: Thornton Park, MD;  Location: [facility];  Service: Orthopedics;  Laterality: Right;   KNEE ARTHROSCOPY Right 06/26/2021   Procedure: ARTHROSCOPY KNEE;  Surgeon: Thornton Park, MD;  Location: [facility];  Service: Orthopedics;  Laterality: Right;   KNEE ARTHROSCOPY WITH MEDIAL MENISECTOMY Right 05/30/2021   Procedure: KNEE ARTHROSCOPY WITH MEDIAL AND LATERAL MENISECTOMIES AND CHONDROPLASTY OF THE PATELLA;  Surgeon: Thornton Park, MD;  Location: [facility];  Service: Orthopedics;  Laterality: Right;   TONSILLECTOMY     TRIGGER FINGER RELEASE Left 12/03/2016   Procedure: 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 If you have any questions, ask your nurse or doctor.          acetaminophen 650 MG CR tablet Commonly known as: TYLENOL Take 1 tablet (650 mg total) by mouth as needed for pain (mild).   albuterol 108 (90 Base) MCG/ACT inhaler Commonly known as: VENTOLIN  HFA Inhale 1-2 puffs into the lungs every 6 (six) hours as needed for wheezing or shortness of breath.   dutasteride 0.5 MG capsule Commonly known as: AVODART TAKE ONE (1) CAPSULE EACH DAY.   Eliquis 5 MG Tabs tablet Generic drug: apixaban TAKE (1) TABLET BY MOUTH TWICE DAILY   Ipratropium-Albuterol 20-100 MCG/ACT Aers respimat Commonly known as: COMBIVENT Inhale 1 puff into the lungs every 6 (six) hours as needed for wheezing  or shortness of breath.   levothyroxine 25 MCG tablet Commonly known as: SYNTHROID Take 1 tablet (25 mcg total) by mouth daily before breakfast.   losartan 100 MG tablet Commonly known as: COZAAR Take 1 tablet (100 mg total) by mouth daily.   metoprolol tartrate 25 MG tablet Commonly known as: LOPRESSOR Take 0.5 tablets (12.5 mg total) by mouth 2 (two) times daily.   tamsulosin 0.4 MG Caps capsule Commonly known as: FLOMAX TAKE (1) CAPSULE BY MOUTH EVERY DAY   triamcinolone ointment 0.1 % Commonly known as: KENALOG Apply to aa's rash BID up to two weeks. Avoid applying to face, groin, and axilla. Use as directed. Long-term use can cause thinning of the skin.   Uribel 118 MG Caps Take 1 capsule by mouth every morning.        Allergies:  Allergies  Allergen Reactions   Statins Other (See Comments)    Patient declines any statins   Ciprofloxacin Rash    Family History: Family History  Problem Relation Age of Onset   Ovarian cancer Mother    Throat cancer Father    Prostate cancer Neg Hx    Kidney cancer Neg Hx     Social History:  reports that he quit smoking about 13 years ago. His smoking use included cigarettes. He smoked an average of .5 packs per day. He has never used smokeless tobacco. He reports current alcohol use. He reports that he does not use drugs.   Physical Exam: BP 129/80    Pulse 83    Ht 6' (1.829 m)    Wt 215 lb (97.5 kg)    BMI 29.16 kg/m   Constitutional:  Well nourished. Alert and oriented, No acute  distress. HEENT: [place] AT, mask in place.  Trachea midline Cardiovascular: No clubbing, cyanosis, or edema. Respiratory: Normal respiratory effort, no increased work of breathing. GU: No CVA tenderness.  No bladder fullness or masses.  Patient with uncircumcised phallus. Foreskin easily retracted  Urethral meatus is patent.  No penile discharge. No penile lesions or rashes. Scrotum without lesions, cysts, rashes and/or edema.  Testicles are located scrotally bilaterally. No masses are appreciated in the testicles. Left and right epididymis are normal. Rectal: Patient with  normal sphincter tone. Anus and perineum without scarring or rashes. No rectal masses are appreciated. Prostate is approximately 40 grams, no nodules are appreciated. Seminal vesicles could not be palpated Neurologic: Grossly intact, no focal deficits, moving all 4 extremities. Psychiatric: Normal mood and affect.   Laboratory Data: Component     Latest Ref Rng & Units 10/22/2021  Prostate Specific Ag, Serum     0.0 - 4.0 ng/mL 0.2   WBC (White Blood Cell Count) 4.1 - 10.2 103/uL 5.3   RBC (Red Blood Cell Count) 4.69 - 6.13 106/uL 3.23 Low    Hemoglobin 14.1 - 18.1 gm/dL 11.5 Low    Hematocrit 40.0 - 52.0 % 33.5 Low    MCV (Mean Corpuscular Volume) 80.0 - 100.0 fl 103.7 High    MCH (Mean Corpuscular Hemoglobin) 27.0 - 31.2 pg 35.6 High    MCHC (Mean Corpuscular Hemoglobin Concentration) 32.0 - 36.0 gm/dL 34.3   Platelet Count 150 - 450 103/uL 197   RDW-CV (Red Cell Distribution Width) 11.6 - 14.8 % 13.0   MPV (Mean Platelet Volume) 9.4 - 12.4 fl 7.9 Low    Neutrophils 1.50 - 7.80 103/uL 3.13   Lymphocytes 1.00 - 3.60 103/uL 1.20   Monocytes 0.00 - 1.50 103/uL 0.73  Eosinophils 0.00 - 0.55 103/uL 0.16   Basophils 0.00 - 0.09 103/uL 0.03   Neutrophil % 32.0 - 70.0 % 59.5   Lymphocyte % 10.0 - 50.0 % 22.8   Monocyte % 4.0 - 13.0 % 13.9 High    Eosinophil % 1.0 - 5.0 % 3.0   Basophil% 0.0 - 2.0 % 0.6   Immature  Granulocyte % <=0.7 % 0.2   Immature Granulocyte Count <=0.06 10^3/L 0.01   Resulting Agency  [facility]  Specimen Collected: 08/06/21 10:46 Last Resulted: 08/06/21 11:17  Received From: [facility]  Result Received: 09/03/21 10:56   Glucose 70 - 110 mg/dL 101   Sodium 136 - 145 mmol/L 134 Low    Potassium 3.6 - 5.1 mmol/L 4.7   Chloride 97 - 109 mmol/L 101   Carbon Dioxide (CO2) 22.0 - 32.0 mmol/L 29.4   Calcium 8.7 - 10.3 mg/dL 9.0   Urea Nitrogen (BUN) 7 - 25 mg/dL 7   Creatinine 0.7 - 1.3 mg/dL 0.7   Glomerular Filtration Rate (eGFR), MDRD Estimate >60 mL/min/1.73sq m 111   BUN/Crea Ratio 6.0 - 20.0 10.0   Anion Gap w/K 6.0 - 16.0 8.3   Resulting Agency  [facility]  Specimen Collected: 08/06/21 10:46 Last Resulted: 08/06/21 14:43  Received From: [facility]  Result Received: 09/03/21 10:56  I have reviewed the labs.  Pertinent Imaging: No recent imaging   Assessment & Plan:    1. BPH with LUTS -PSA stable -DRE benign -symptoms - weak stream  -continue conservative management, avoiding bladder irritants and timed voiding's -Continue tamsulosin 0.4 mg daily and dutasteride 0.5 mg daily - refills given    2. Erectile dysfunction -Not a sexually active as in the past -No need for medication refills at this visit  Return in about 1 year (around 10/29/2022) for IPSS, SHIM, PSA and exam.   [facility] [address], [place], [place] [address] [phone]  Zara Council, PA-C

## 2021-10-29 ENCOUNTER — Other Ambulatory Visit: Payer: Self-pay

## 2021-10-29 ENCOUNTER — Encounter: Payer: Self-pay | Admitting: Urology

## 2021-10-29 ENCOUNTER — Ambulatory Visit: Payer: PPO | Admitting: Urology

## 2021-10-29 VITALS — BP 129/80 | HR 83 | Ht 72.0 in | Wt 215.0 lb

## 2021-10-29 DIAGNOSIS — N529 Male erectile dysfunction, unspecified: Secondary | ICD-10-CM | POA: Diagnosis not present

## 2021-10-29 DIAGNOSIS — N401 Enlarged prostate with lower urinary tract symptoms: Secondary | ICD-10-CM

## 2021-10-29 DIAGNOSIS — N138 Other obstructive and reflux uropathy: Secondary | ICD-10-CM | POA: Diagnosis not present

## 2021-10-29 MED ORDER — DUTASTERIDE 0.5 MG PO CAPS: ORAL_CAPSULE | ORAL | 3 refills | Status: DC

## 2021-10-31 ENCOUNTER — Other Ambulatory Visit: Payer: Self-pay

## 2021-10-31 ENCOUNTER — Ambulatory Visit (INDEPENDENT_AMBULATORY_CARE_PROVIDER_SITE_OTHER): Payer: PPO | Admitting: Dermatology

## 2021-10-31 DIAGNOSIS — L308 Other specified dermatitis: Secondary | ICD-10-CM

## 2021-10-31 DIAGNOSIS — R21 Rash and other nonspecific skin eruption: Secondary | ICD-10-CM

## 2021-10-31 NOTE — Patient Instructions (Signed)

## 2021-10-31 NOTE — Progress Notes (Signed)
° °  Follow-Up Visit   Subjective  Alexander Carey is a 71 y.o. male who presents for the following: Rash recheck (Patient is here today for suture removal. Patient states that his rash has resolved and is no longer bothersome. ).  The following portions of the chart were reviewed this encounter and updated as appropriate:       Review of Systems:  No other skin or systemic complaints except as noted in HPI or Assessment and Plan.  Objective  Well appearing patient in no apparent distress; mood and affect are within normal limits.  A focused examination was performed including the face, arms, legs and hands. Relevant physical exam findings are noted in the Assessment and Plan.  Trunk, extremities Clear.     Assessment & Plan  Rash and other nonspecific skin eruption Trunk, extremities  PERIVASCULAR DERMATITIS WITH EXTRAVASATED ERYTHROCYTES No evidence of vasculitis or inflammation of the blood vessels -   Biopsy results consistent with viral rash or drug rash. - rash resolved per patient so consistent with viral rash as a drug rash wouldn't of resolved. No treatment needed.   Encounter for Removal of Sutures - Biopsy site at the R lat ant thigh x 2 is clean, dry and intact - Wound cleansed, sutures removed, wound cleansed and steri strips applied.  - Discussed pathology results showing drug vs viral rash.  - Patient advised to keep steri-strips dry until they fall off. - Scars remodel for a full year. - Once steri-strips fall off, patient can apply over-the-counter silicone scar cream each night to help with scar remodeling if desired. - Patient advised to call with any concerns or if they notice any new or changing lesions.   Related Medications triamcinolone ointment (KENALOG) 0.1 % Apply to aa's rash BID up to two weeks. Avoid applying to face, groin, and axilla. Use as directed. Long-term use can cause thinning of the skin.   Return for appointment as scheduled; cancel  03/23 appt keep 04/23 with Dr. Nicole Kindred.  Luther Redo, CMA, am acting as scribe for Forest Gleason, MD .  Documentation: I have reviewed the above documentation for accuracy and completeness, and I agree with the above.  Forest Gleason, MD

## 2021-11-04 ENCOUNTER — Encounter: Payer: Self-pay | Admitting: Dermatology

## 2021-11-07 ENCOUNTER — Ambulatory Visit (HOSPITAL_COMMUNITY)
Admission: RE | Admit: 2021-11-07 | Discharge: 2021-11-07 | Disposition: A | Discharge status: Home / Self Care | Payer: PPO | Source: Ambulatory Visit | Attending: Nurse Practitioner | Admitting: Nurse Practitioner

## 2021-11-07 ENCOUNTER — Other Ambulatory Visit: Payer: Self-pay

## 2021-11-07 VITALS — BP 140/74

## 2021-11-07 DIAGNOSIS — I10 Essential (primary) hypertension: Secondary | ICD-10-CM | POA: Diagnosis present

## 2021-11-07 DIAGNOSIS — Z7901 Long term (current) use of anticoagulants: Secondary | ICD-10-CM | POA: Diagnosis not present

## 2021-11-07 DIAGNOSIS — Z0130 Encounter for examination of blood pressure without abnormal findings: Secondary | ICD-10-CM | POA: Insufficient documentation

## 2021-11-07 LAB — BASIC METABOLIC PANEL
Anion gap: 7 (ref 5–15)
BUN: 11 mg/dL (ref 8–23)
CO2: 26 mmol/L (ref 22–32)
Calcium: 8.8 mg/dL — ABNORMAL LOW (ref 8.9–10.3)
Chloride: 101 mmol/L (ref 98–111)
Creatinine, Ser: 0.81 mg/dL (ref 0.61–1.24)
GFR, Estimated: >60 mL/min (ref >60)
Glucose, Bld: 103 mg/dL — ABNORMAL HIGH (ref 70–99)
Potassium: 4.5 mmol/L (ref 3.5–5.1)
Sodium: 134 mmol/L — ABNORMAL LOW (ref 135–145)

## 2021-11-07 NOTE — Addendum Note (Signed)
Encounter addended by: Sherran Needs, NP on: 11/07/2021 12:06 PM  Actions taken: Clinical Note Signed

## 2021-11-07 NOTE — Progress Notes (Signed)
Pt is in for BP check on increase of losartan to 100 mg daily. Bmet done. BP readings at home look controlled. F/u with Dr. Rayann Heman in May  as scheduled

## 2021-11-11 ENCOUNTER — Other Ambulatory Visit: Payer: Self-pay

## 2021-11-11 ENCOUNTER — Encounter: Payer: Self-pay | Admitting: Dermatology

## 2021-11-11 ENCOUNTER — Other Ambulatory Visit: Payer: Self-pay | Admitting: Orthopedic Surgery

## 2021-11-11 ENCOUNTER — Ambulatory Visit
Admission: RE | Admit: 2021-11-11 | Discharge: 2021-11-11 | Disposition: A | Discharge status: Home / Self Care | Payer: PPO | Source: Ambulatory Visit | Attending: Orthopedic Surgery | Admitting: Orthopedic Surgery

## 2021-11-11 DIAGNOSIS — R2240 Localized swelling, mass and lump, unspecified lower limb: Secondary | ICD-10-CM | POA: Insufficient documentation

## 2021-11-17 ENCOUNTER — Telehealth: Payer: Self-pay | Admitting: Physician Assistant

## 2021-11-17 NOTE — Telephone Encounter (Signed)
Patient had a brief episode of atrial fibrillation earlier with heart rate going up to 110s, however he took additional dose of metoprolol and that has since came out of A-fib.  Current blood pressure is 132/86, heart rate 69.  We will continue observation.  Patient has a history of atrial fibrillation ablation. ?

## 2021-11-20 ENCOUNTER — Ambulatory Visit: Payer: PPO | Admitting: Dermatology

## 2022-01-07 ENCOUNTER — Ambulatory Visit: Payer: PPO | Admitting: Dermatology

## 2022-01-13 ENCOUNTER — Ambulatory Visit: Payer: PPO | Admitting: Internal Medicine

## 2022-02-11 ENCOUNTER — Other Ambulatory Visit (HOSPITAL_COMMUNITY): Payer: Self-pay | Admitting: Internal Medicine

## 2022-03-03 ENCOUNTER — Other Ambulatory Visit (HOSPITAL_COMMUNITY): Payer: Self-pay | Admitting: Internal Medicine

## 2022-03-03 DIAGNOSIS — I4819 Other persistent atrial fibrillation: Secondary | ICD-10-CM

## 2022-03-03 NOTE — Telephone Encounter (Signed)
Eliquis '5mg'$  refill request received. Patient is 71 years old, weight-97.5kg, Crea-0.81 on 11/07/2021, Diagnosis-Afib, and last seen by Roderic Palau on 11/07/2021. Dose is appropriate based on dosing criteria. Will send in refill to requested pharmacy.

## 2022-03-25 ENCOUNTER — Ambulatory Visit: Payer: PPO | Admitting: Dermatology

## 2022-07-31 ENCOUNTER — Ambulatory Visit: Payer: PPO | Admitting: Cardiovascular Disease

## 2022-08-27 ENCOUNTER — Encounter: Payer: Self-pay | Admitting: Cardiology

## 2022-08-27 ENCOUNTER — Ambulatory Visit: Payer: PPO | Attending: Cardiovascular Disease | Admitting: Cardiology

## 2022-08-27 VITALS — BP 152/82 | HR 63 | Ht 72.0 in | Wt 233.0 lb

## 2022-08-27 DIAGNOSIS — I4819 Other persistent atrial fibrillation: Secondary | ICD-10-CM | POA: Diagnosis not present

## 2022-08-27 DIAGNOSIS — I10 Essential (primary) hypertension: Secondary | ICD-10-CM | POA: Diagnosis not present

## 2022-08-27 DIAGNOSIS — R072 Precordial pain: Secondary | ICD-10-CM | POA: Diagnosis not present

## 2022-08-27 DIAGNOSIS — R0789 Other chest pain: Secondary | ICD-10-CM | POA: Diagnosis not present

## 2022-08-27 NOTE — Patient Instructions (Addendum)
Medication Instructions:  Your physician recommends that you continue on your current medications as directed. Please refer to the Current Medication list given to you today.  *If you need a refill on your cardiac medications before your next appointment, please call your pharmacy*  Lab Work: BMET and CBC prior to CT scan and ablation (see letter) If you have labs (blood work) drawn today and your tests are completely normal, you will receive your results only by: [facility] (if you have MyChart) OR A paper copy in the mail If you have any lab test that is abnormal or we need to change your treatment, we will call you to review the results.  Testing/Procedures: Your physician has requested that you have en exercise stress myoview. For further information please visit [email or web address]. Please follow instruction sheet, as given.   Your physician has requested that you have cardiac CT. Cardiac computed tomography (CT) is a painless test that uses an x-ray machine to take clear, detailed pictures of your heart. For further information please visit [email or web address]. Please follow instruction sheet as given.  Your physician has recommended that you have an ablation. Catheter ablation is a medical procedure used to treat some cardiac arrhythmias (irregular heartbeats). During catheter ablation, a long, thin, flexible tube is put into a blood vessel in your groin (upper thigh), or neck. This tube is called an ablation catheter. It is then guided to your heart through the blood vessel. Radio frequency waves destroy small areas of heart tissue where abnormal heartbeats may cause an arrhythmia to start. Please see the instruction sheet given to you today.  Follow-Up: At [facility], you and your health needs are our priority.  As part of our continuing mission to provide you with exceptional heart care, we have created designated Provider Care Teams.  These Care Teams include your  primary Cardiologist (physician) and Advanced Practice Providers (APPs -  Physician Assistants and Nurse Practitioners) who all work together to provide you with the care you need, when you need it.  Your next appointment:   You will be contacted to schedule your follow up appointments.    Important Information About Sugar

## 2022-08-27 NOTE — Progress Notes (Signed)
Electrophysiology Office Follow up Visit Note:    Date:  08/27/2022   ID:  Alexander Carey, DOB 08/29/1951, MRN [id number]  PCP:  Valera Castle, MD  [facility] Cardiologist:  Virl Axe, MD  [facility] Electrophysiologist:  Vickie Epley, MD    Interval History:    Alexander Carey is a 71 y.o. male who presents for a follow up visit.  The patient was previously followed by Dr. Rayann Heman and underwent an A-fib ablation July 27, 2018 with Dr. Rayann Heman.  The patient was seen by the [facility] clinic cardiology team in October 2023.  At his visit with Clabe Seal, he reported several episodes of breakthrough atrial fibrillation.  He is maintained on Eliquis for stroke prophylaxis  A heart monitor was ordered for 7 days in October which showed no evidence of atrial fibrillation.  There were some PACs.  He has atrial fibrillation he feels very poorly.  He is dizzy and fatigued.  His most recent episode was November 23 and lasted 26 to 27 hours.  They are occurring at least every 2 weeks.  He also had some chest pressure during the most recent episode.     Past Medical History:  Diagnosis Date   AF (atrial fibrillation) (HCC)    Back pain    lower   Basal cell carcinoma 11/01/2019   nasal tip, right ear concha   ED (erectile dysfunction)    ED (erectile dysfunction)    Enlarged prostate    Essential hypertension    HOH (hard of hearing)    Hypogonadism in male    Hypothyroidism    Paroxysmal A-fib ([place] II)    a. 07/2018 s/p PVI.    Past Surgical History:  Procedure Laterality Date   ATRIAL FIBRILLATION ABLATION N/A 07/27/2018   Procedure: ATRIAL FIBRILLATION ABLATION;  Surgeon: Thompson Grayer, MD;  Location: [facility];  Service: Cardiovascular;  Laterality: N/A;   CARDIOVERSION N/A 01/13/2018   Procedure: CARDIOVERSION;  Surgeon: Nelva Bush, MD;  Location: [facility];  Service: Cardiovascular;  Laterality: N/A;   CARDIOVERSION N/A 01/26/2018   Procedure:  CARDIOVERSION;  Surgeon: Minna Merritts, MD;  Location: [facility];  Service: Cardiovascular;  Laterality: N/A;   CARDIOVERSION N/A 03/01/2018   Procedure: CARDIOVERSION;  Surgeon: Minna Merritts, MD;  Location: [facility] [facility];  Service: Cardiovascular;  Laterality: N/A;   CARDIOVERSION N/A 05/21/2018   Procedure: CARDIOVERSION;  Surgeon: Wellington Hampshire, MD;  Location: [facility];  Service: Cardiovascular;  Laterality: N/A;   COLONOSCOPY  04/2005   COLONOSCOPY WITH PROPOFOL N/A 03/28/2020   Procedure: COLONOSCOPY WITH PROPOFOL;  Surgeon: Robert Bellow, MD;  Location: [facility];  Service: Endoscopy;  Laterality: N/A;   INCISION AND DRAINAGE Right 06/26/2021   Procedure: INCISION AND DRAINAGE;  Surgeon: Thornton Park, MD;  Location: [facility];  Service: Orthopedics;  Laterality: Right;   KNEE ARTHROSCOPY Right 06/20/2021   Procedure: RIGHT KNEE DIAGNOSTIC  ARTHROSCOPY;  Surgeon: Thornton Park, MD;  Location: [facility];  Service: Orthopedics;  Laterality: Right;   KNEE ARTHROSCOPY Right 06/26/2021   Procedure: ARTHROSCOPY KNEE;  Surgeon: Thornton Park, MD;  Location: [facility];  Service: Orthopedics;  Laterality: Right;   KNEE ARTHROSCOPY WITH MEDIAL MENISECTOMY Right 05/30/2021   Procedure: KNEE ARTHROSCOPY WITH MEDIAL AND LATERAL MENISECTOMIES AND CHONDROPLASTY OF THE PATELLA;  Surgeon: Thornton Park, MD;  Location: [place];  Service: Orthopedics;  Laterality: Right;   TONSILLECTOMY     TRIGGER FINGER RELEASE Left 12/03/2016  Procedure: 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   albuterol (VENTOLIN HFA) 108 (90 Base) MCG/ACT inhaler Inhale 1-2 puffs into the lungs every 6 (six)  hours as needed for wheezing or shortness of breath.   apixaban (ELIQUIS) 5 MG TABS tablet TAKE (1) TABLET BY MOUTH TWICE DAILY   dutasteride (AVODART) 0.5 MG capsule TAKE ONE (1) CAPSULE EACH DAY.   Ipratropium-Albuterol (COMBIVENT) 20-100 MCG/ACT AERS respimat Inhale 1 puff into the lungs every 6 (six) hours as needed for wheezing or shortness of breath.    levothyroxine (SYNTHROID, LEVOTHROID) 25 MCG tablet Take 1 tablet (25 mcg total) by mouth daily before breakfast.   losartan (COZAAR) 100 MG tablet Take 1 tablet (100 mg total) by mouth daily.   Meth-Hyo-M Bl-Na Phos-Ph Sal (URIBEL) 118 MG CAPS Take 1 capsule by mouth every morning.   metoprolol tartrate (LOPRESSOR) 25 MG tablet TAKE (1/2) TABLET BY MOUTH TWICE DAILY   tamsulosin (FLOMAX) 0.4 MG CAPS capsule TAKE (1) CAPSULE BY MOUTH EVERY DAY   triamcinolone ointment (KENALOG) 0.1 % Apply to aa's rash BID up to two weeks. Avoid applying to face, groin, and axilla. Use as directed. Long-term use can cause thinning of the skin.     Allergies:   Statins and Ciprofloxacin   Social History   Socioeconomic History   Marital status: Married    Spouse name: Not on file   Number of children: Not on file   Years of education: Not on file   Highest education level: Not on file  Occupational History   Not on file  Tobacco Use   Smoking status: Former    Packs/day: 0.50    Types: Cigarettes    Quit date: 09/15/2008    Years since quitting: 13.9   Smokeless tobacco: Never  Vaping Use   Vaping Use: Never used  Substance and Sexual Activity   Alcohol use: Yes    Comment: on the weekend- has a few shots   Drug use: No   Sexual activity: Not on file  Other Topics Concern   Not on file  Social History Narrative   Lives in [place] with spouse   Owns rental property   Social Determinants of Health   Financial Resource Strain: Not on file  Food Insecurity: Not on file  Transportation Needs: Not on file  Physical Activity: Not on  file  Stress: Not on file  Social Connections: Not on file     Family History: The patient's family history includes Ovarian cancer in his mother; Throat cancer in his father. There is no history of Prostate cancer or Kidney cancer.  ROS:   Please see the history of present illness.    All other systems reviewed and are negative.  EKGs/Labs/Other Studies Reviewed:    The following studies were reviewed today:  2019 A-fib ablation-veins and posterior wall isolation  EKG:  The ekg ordered today demonstrates sinus rhythm.  QTc 402 ms.  Recent Labs: 11/07/2021: BUN 11; Creatinine, Ser 0.81; Potassium 4.5; Sodium 134  Recent Lipid Panel No results found for: "CHOL", "TRIG", "HDL", "CHOLHDL", "VLDL", "LDLCALC", "LDLDIRECT"  Physical Exam:    VS:  BP (!) 152/82   Pulse 63  Ht 6' (1.829 m)   Wt 233 lb (105.7 kg)   SpO2 98%   BMI 31.60 kg/m     Wt Readings from Last 3 Encounters:  08/27/22 233 lb (105.7 kg)  10/29/21 215 lb (97.5 kg)  10/24/21 224 lb 3.2 oz (101.7 kg)     GEN:  Well nourished, well developed in no acute distress HEENT: Normal NECK: No JVD; No carotid bruits LYMPHATICS: No lymphadenopathy CARDIAC: RRR, no murmurs, rubs, gallops RESPIRATORY:  Clear to auscultation without rales, wheezing or rhonchi  ABDOMEN: Soft, non-tender, non-distended MUSCULOSKELETAL:  No edema; No deformity  SKIN: Warm and dry NEUROLOGIC:  Alert and oriented x 3 PSYCHIATRIC:  Normal affect        ASSESSMENT:    1. Persistent atrial fibrillation ([place])   2. Primary hypertension   3. Chest pressure    PLAN:    In order of problems listed above:  #Persistent atrial fibrillation On Eliquis for stroke prophylaxis I discussed treatment options with the patient including antiarrhythmic drugs (amiodarone and Tikosyn) versus catheter ablation.  The patient is very clear that he would like to proceed with catheter ablation if he is a candidate.  We discussed the possibility that  during a redo ablation would be no significant targets left from the prior ablation attempt.  If this is the case, would favor keeping him in the hospital the day of the procedure for Tikosyn loading.  I told him that this would be a procedure day decision based on what the initial electroanatomic map shows.  They are willing to proceed with that if necessary.  We will get him scheduled for a catheter ablation.  He will need a CT scan prior to the procedure.  He will also need a stress test done prior to the procedure given the chest discomfort experienced during the most recent episode of atrial fibrillation.  Will plan for nuclear stress.  Discussed treatment options today for their AF including antiarrhythmic drug therapy and ablation. Discussed risks, recovery and likelihood of success. Discussed potential need for repeat ablation procedures and antiarrhythmic drugs after an initial ablation. They wish to proceed with scheduling.  Risk, benefits, and alternatives to EP study and radiofrequency ablation for afib were also discussed in detail today. These risks include but are not limited to stroke, bleeding, vascular damage, tamponade, perforation, damage to the esophagus, lungs, and other structures, pulmonary vein stenosis, worsening renal function, and death. The patient understands these risk and wishes to proceed.  We will therefore proceed with catheter ablation at the next available time.  Carto, ICE, anesthesia are requested for the procedure.  Will also obtain CT PV protocol prior to the procedure to exclude LAA thrombus and further evaluate atrial anatomy.  #Chest pressure During most recent episode of atrial fibrillation with RVR.  Plan for a nuclear stress test to further evaluate.  #Hypertension Above goal today.  Recommend checking blood pressures 1-2 times per week at home and recording the values.  Recommend bringing these recordings to the primary care physician.     Medication  Adjustments/Labs and Tests Ordered: Current medicines are reviewed at length with the patient today.  Concerns regarding medicines are outlined above.  No orders of the defined types were placed in this encounter.  No orders of the defined types were placed in this encounter.    Signed, Lars Mage, MD, [facility], [facility] 08/27/2022 10:55 AM    Electrophysiology [facility]

## 2022-08-28 ENCOUNTER — Other Ambulatory Visit: Payer: Self-pay

## 2022-08-28 ENCOUNTER — Telehealth: Payer: Self-pay

## 2022-08-28 DIAGNOSIS — I4819 Other persistent atrial fibrillation: Secondary | ICD-10-CM

## 2022-08-28 NOTE — Telephone Encounter (Signed)
Work up complete for procedure...  Pt's Wife is aware of date/time and Instruction letters will be mailed per pt's request.

## 2022-09-01 NOTE — Addendum Note (Signed)
Addended by: Janan Ridge on: 09/01/2022 01:43 PM   Modules accepted: Orders

## 2022-09-10 ENCOUNTER — Encounter
Admission: RE | Admit: 2022-09-10 | Discharge: 2022-09-10 | Disposition: A | Discharge status: Home / Self Care | Payer: PPO | Source: Ambulatory Visit | Attending: Cardiovascular Disease | Admitting: Cardiovascular Disease

## 2022-09-10 DIAGNOSIS — R072 Precordial pain: Secondary | ICD-10-CM | POA: Diagnosis present

## 2022-09-10 LAB — NM MYOCAR MULTI W/SPECT W/WALL MOTION / EF
Angina Index: 0
Base ST Depression (mm): 0 mm
Duke Treadmill Score: 5
Estimated workload: 7
Exercise duration (min): 5 min
Exercise duration (sec): 1 s
LV dias vol: 53 mL (ref 62–150)
LV sys vol: 15 mL
Nuc Stress EF: 72 %
Peak HR: 142 {beats}/min
Percent HR: 95 %
Rest HR: 80 {beats}/min
Rest Nuclear Isotope Dose: 10.9 mCi
SDS: 1
SRS: 18
SSS: 10
ST Depression (mm): 0 mm
Stress Nuclear Isotope Dose: 29.1 mCi
TID: 0.58

## 2022-09-10 MED ORDER — TECHNETIUM TC 99M TETROFOSMIN IV KIT
10.0000 | PACK | Freq: Once | INTRAVENOUS | Status: AC | PRN
  Administered 2022-09-10: 10.89 via INTRAVENOUS

## 2022-09-10 MED ORDER — TECHNETIUM TC 99M TETROFOSMIN IV KIT
29.0700 | PACK | Freq: Once | INTRAVENOUS | Status: AC | PRN
  Administered 2022-09-10: 29.07 via INTRAVENOUS

## 2022-09-10 MED ORDER — REGADENOSON 0.4 MG/5ML IV SOLN: 0.4000 mg | Freq: Once | INTRAVENOUS | Status: DC

## 2022-09-18 ENCOUNTER — Telehealth: Payer: Self-pay | Admitting: Cardiology

## 2022-09-18 NOTE — Telephone Encounter (Signed)
Patient has been added to Dr. Mardene Speak waitlist

## 2022-09-18 NOTE — Telephone Encounter (Signed)
Returned the call to the patient, per the dpr. She stated that the patient has had two episodes of afib since his office visit on 12/13. One of them lasing 7 hours. He currently takes Metoprolol 12.5 mg bid but will take a whole tablet when in afib.   He does not feel like he is currently in afib and is at work today.   They would like to know if there is a waiting list for the ablation so that he could possibly get it completed earlier than March.

## 2022-09-18 NOTE — Telephone Encounter (Signed)
Patient c/o Palpitations:  High priority if patient c/o lightheadedness, shortness of breath, or chest pain  How long have you had palpitations/irregular HR/ Afib? Are you having the symptoms now? Had episode on 01/02  Are you currently experiencing lightheadedness, SOB or CP? No   Do you have a history of afib (atrial fibrillation) or irregular heart rhythm? Yes  Have you checked your BP or HR? (document readings if available):  09/16/22:  107/69 HR 165 during Afib & 132/69 HR 74 after Afib   Are you experiencing any other symptoms? Dizziness when Afib occurs and soreness in chest after it occurs   Wife is calling to report the patient has had two episodes of Afib since seeing Dr. Quentin Ore on 12/13. The first episode was 12/19 and the second 01/02. She states he was in Afib for 7 hrs on last episode. They are thankful to find stress test results came back good and are hoping to be put on a wait list to possibly get the scheduled ablation sooner if there is a cancellation. Please advise.

## 2022-09-29 ENCOUNTER — Other Ambulatory Visit: Payer: Self-pay | Admitting: Urology

## 2022-09-29 ENCOUNTER — Telehealth: Payer: Self-pay | Admitting: Cardiology

## 2022-09-29 NOTE — Telephone Encounter (Signed)
Returned the call to the patient and his wife. The patient is scheduled for an ablation in March.  The wife stated that the patient has been in afib since Saturday with heart rate ranges from 70-150's. He has complaints of minor shortness of breath and fatigue.   The wife stated that when this happens it normally lasts one day. They will give the patient Metoprolol 25 mg in the morning, four hours later 12.5 mg if the patient is still in afib and then 25 mg 4 hours later if he is still in afib. At that point, the patient will usually convert. He takes Metoprolol Tartrate 12.5 mg bid usually.  The wife wants to know what else they can do to try and help convert him and/or keep the heart rate under 100. Blood pressure this morning was 132/68 and heart rate was 159. It did got back down to 78 after a whole tablet.

## 2022-09-29 NOTE — Telephone Encounter (Signed)
Pt c/o medication issue:  1. Name of Medication: metoprolol tartrate (LOPRESSOR) 25 MG tablet   2. How are you currently taking this medication (dosage and times per day)? As prescribed   3. Are you having a reaction (difficulty breathing--STAT)?   4. What is your medication issue? Pt's wife is requesting call back to discuss patient being in afib still. They would like to know if they should change up this medication or be seen in office. Please advise.

## 2022-09-29 NOTE — Telephone Encounter (Signed)
Patient's wife is following up, stating the patient is still in afib. She wanted to know if Dr. Quentin Ore has had an opportunity to review note from RN. I informed her that he has not responded yet because he is in clinic today, but will provide further recommendations as soon as he is able.

## 2022-09-30 NOTE — Telephone Encounter (Signed)
Pt's wife is requesting return call for update. She states her husband is still having symptoms from afib, including tiredness, SOB, and dizziness.   They are requesting to see someone see them today and would like to know if it would be best to come into our office or try and schedule with the afib clinic. Requesting return call.

## 2022-09-30 NOTE — Telephone Encounter (Signed)
Pt called requesting appt in afib clinic. Appt made for 1/17. Pt will continue with extra metoprolol for rate control until appt.

## 2022-10-01 ENCOUNTER — Ambulatory Visit (HOSPITAL_COMMUNITY)
Admission: RE | Admit: 2022-10-01 | Discharge: 2022-10-01 | Disposition: A | Discharge status: Home / Self Care | Payer: PPO | Source: Ambulatory Visit | Attending: Nurse Practitioner | Admitting: Nurse Practitioner

## 2022-10-01 ENCOUNTER — Encounter (HOSPITAL_COMMUNITY): Payer: Self-pay | Admitting: Nurse Practitioner

## 2022-10-01 VITALS — BP 116/72 | HR 104 | Ht 72.0 in | Wt 231.6 lb

## 2022-10-01 DIAGNOSIS — Z006 Encounter for examination for normal comparison and control in clinical research program: Secondary | ICD-10-CM

## 2022-10-01 DIAGNOSIS — D6869 Other thrombophilia: Secondary | ICD-10-CM

## 2022-10-01 DIAGNOSIS — I10 Essential (primary) hypertension: Secondary | ICD-10-CM | POA: Diagnosis not present

## 2022-10-01 DIAGNOSIS — I4819 Other persistent atrial fibrillation: Secondary | ICD-10-CM | POA: Insufficient documentation

## 2022-10-01 MED ORDER — METOPROLOL TARTRATE 25 MG PO TABS: 25.0000 mg | ORAL_TABLET | Freq: Two times a day (BID) | ORAL | 3 refills | Status: DC

## 2022-10-01 NOTE — Patient Instructions (Addendum)
Increase metoprolol to '25mg'$  twice a day --day of procedure reduce back to 1/2 tablet twice a day   Cardioversion scheduled for: Tuesday, February 6th   - Arrive at the [facility] and go to admitting at 930am   - Do not eat or drink anything after midnight the night prior to your procedure.   - Take all your morning medication (except diabetic medications) with a sip of water prior to arrival.  - You will not be able to drive home after your procedure.    - Do NOT miss any doses of your blood thinner - if you should miss a dose please notify our office immediately.   - If you feel as if you go back into normal rhythm prior to scheduled cardioversion, please notify our office immediately.  If your procedure is canceled in the cardioversion suite you will be charged a cancellation fee.

## 2022-10-01 NOTE — Progress Notes (Signed)
Primary Care Physician: Valera Castle, MD Referring Physician: Dr. Camille Bal is a 72 y.o. male with a h/o persistent afib with termination pauses, failing flecainide, Rythmol and currently on  Amiodarone. He is in the afib clinic for one month follow up from ablation. He is in SR. Has not noted any afib since the procedure.  No swallowing or groin  issues.  F/u in afib clinic 10/24/21. He is here for one year f/u. He has been off amiodarone since shortly after ablation and is staying in SR. However, he has noted BP's at hone running above 150 sys. It is 160/86 in the clinic today.  F/u in afib clinic 10/01/22. He recently saw Dr. Quentin Ore and is pending an ablation in March. He is now in the afib clinic as he went back into afib on Saturday and missed a dose of his anticoagulation on Sunday. He feels weak and dizzy in afib. He has resumed SR spontaneously in the past after 1-2 days. He has tried an occasional extra 1/2 metoprolol since Saturday.Marland Kitchen His v rate today is 104 bpm. Dr. Quentin Ore  plans to admit him for Tikosyn after the ablation is he does not see any good targets to ablate.  Today, he denies symptoms of palpitations, chest pain, shortness of breath, orthopnea, PND, lower extremity edema, dizziness, presyncope, syncope, or neurologic sequela. The patient is tolerating medications without difficulties and is otherwise without complaint today.   Past Medical History:  Diagnosis Date   AF (atrial fibrillation) (HCC)    Back pain    lower   Basal cell carcinoma 11/01/2019   nasal tip, right ear concha   ED (erectile dysfunction)    ED (erectile dysfunction)    Enlarged prostate    Essential hypertension    HOH (hard of hearing)    Hypogonadism in male    Hypothyroidism    Paroxysmal A-fib ([place])    a. 07/2018 s/p PVI.   Past Surgical History:  Procedure Laterality Date   ATRIAL FIBRILLATION ABLATION N/A 07/27/2018   Procedure: ATRIAL FIBRILLATION ABLATION;   Surgeon: Thompson Grayer, MD;  Location: [facility];  Service: Cardiovascular;  Laterality: N/A;   CARDIOVERSION N/A 01/13/2018   Procedure: CARDIOVERSION;  Surgeon: Nelva Bush, MD;  Location: [facility];  Service: Cardiovascular;  Laterality: N/A;   CARDIOVERSION N/A 01/26/2018   Procedure: CARDIOVERSION;  Surgeon: Minna Merritts, MD;  Location: [facility];  Service: Cardiovascular;  Laterality: N/A;   CARDIOVERSION N/A 03/01/2018   Procedure: CARDIOVERSION;  Surgeon: Minna Merritts, MD;  Location: [facility];  Service: Cardiovascular;  Laterality: N/A;   CARDIOVERSION N/A 05/21/2018   Procedure: CARDIOVERSION;  Surgeon: Wellington Hampshire, MD;  Location: [facility];  Service: Cardiovascular;  Laterality: N/A;   COLONOSCOPY  04/2005   COLONOSCOPY WITH PROPOFOL N/A 03/28/2020   Procedure: COLONOSCOPY WITH PROPOFOL;  Surgeon: Robert Bellow, MD;  Location: [facility];  Service: Endoscopy;  Laterality: N/A;   INCISION AND DRAINAGE Right 06/26/2021   Procedure: INCISION AND DRAINAGE;  Surgeon: Thornton Park, MD;  Location: [facility];  Service: Orthopedics;  Laterality: Right;   KNEE ARTHROSCOPY Right 06/20/2021   Procedure: RIGHT KNEE DIAGNOSTIC  ARTHROSCOPY;  Surgeon: Thornton Park, MD;  Location: [facility];  Service: Orthopedics;  Laterality: Right;   KNEE ARTHROSCOPY Right 06/26/2021   Procedure: ARTHROSCOPY KNEE;  Surgeon: Thornton Park, MD;  Location: [facility];  Service: Orthopedics;  Laterality: Right;   KNEE ARTHROSCOPY WITH MEDIAL MENISECTOMY  Right 05/30/2021   Procedure: KNEE ARTHROSCOPY WITH MEDIAL AND LATERAL MENISECTOMIES AND CHONDROPLASTY OF THE PATELLA;  Surgeon: Thornton Park, MD;  Location: [facility];  Service: Orthopedics;  Laterality: Right;   TONSILLECTOMY     TRIGGER FINGER RELEASE Left 12/03/2016   Procedure: 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     albuterol (VENTOLIN HFA) 108 (90 Base) MCG/ACT inhaler Inhale 1-2 puffs into the lungs every 6 (six) hours as needed for wheezing or shortness of breath.     apixaban (ELIQUIS) 5 MG TABS tablet TAKE (1) TABLET BY MOUTH TWICE DAILY 180 tablet 2   dutasteride (AVODART) 0.5 MG capsule TAKE ONE (1) CAPSULE EACH DAY. 90 capsule 3   Ipratropium-Albuterol (COMBIVENT) 20-100 MCG/ACT AERS respimat Inhale 1 puff into the lungs every 6 (six) hours as needed for wheezing or shortness of breath.      levothyroxine (SYNTHROID, LEVOTHROID) 25 MCG tablet Take 1 tablet (25 mcg total) by mouth daily before breakfast. 30 tablet 1   losartan (COZAAR) 100 MG tablet Take 1 tablet (100 mg total) by mouth daily. 30 tablet 3   Meth-Hyo-M Bl-Na Phos-Ph Sal (URIBEL) 118 MG CAPS Take 1 capsule by mouth every morning.     metoprolol tartrate (LOPRESSOR) 25 MG tablet TAKE (1/2) TABLET BY MOUTH TWICE DAILY 30 tablet 0   tamsulosin (FLOMAX) 0.4 MG CAPS capsule TAKE (1) CAPSULE BY MOUTH EVERY DAY 90 capsule 0   triamcinolone ointment (KENALOG) 0.1 % Apply to aa's rash BID up to two weeks. Avoid applying to face, groin, and axilla. Use as directed. Long-term use can cause thinning of the skin. 80 g 0   No current facility-administered medications for this encounter.    Allergies  Allergen Reactions   Statins Other (See Comments)    Patient declines any statins   Ciprofloxacin Rash    Social History   Socioeconomic History   Marital status: Married    Spouse name: Not on file   Number of children: Not on file   Years of education: Not on file   Highest education level: Not on file  Occupational History   Not on file  Tobacco Use   Smoking status: Former    Packs/day: 0.50    Types: Cigarettes    Quit  date: 09/15/2008    Years since quitting: 14.0   Smokeless tobacco: Never  Vaping Use   Vaping Use: Never used  Substance and Sexual Activity   Alcohol use: Yes    Comment: on the weekend- has a few shots   Drug use: No   Sexual activity: Not on file  Other Topics Concern   Not on file  Social History Narrative   Lives in [place] with spouse   Owns rental property   Social Determinants of Health   Financial Resource Strain: Not on file  Food Insecurity: Not on file  Transportation Needs: Not on file  Physical Activity: Not on file  Stress: Not on file  Social Connections: Not on file  Intimate Partner Violence: Not on file    Family History  Problem Relation Age of Onset   Ovarian cancer Mother    Throat cancer Father  Prostate cancer Neg Hx    Kidney cancer Neg Hx     ROS- All systems are reviewed and negative except as per the HPI above  Physical Exam: There were no vitals filed for this visit.  Wt Readings from Last 3 Encounters:  08/27/22 105.7 kg  10/29/21 97.5 kg  10/24/21 101.7 kg    Labs: Lab Results  Component Value Date   NA 134 (L) 11/07/2021   K 4.5 11/07/2021   CL 101 11/07/2021   CO2 26 11/07/2021   GLUCOSE 103 (H) 11/07/2021   BUN 11 11/07/2021   CREATININE 0.81 11/07/2021   CALCIUM 8.8 (L) 11/07/2021   Lab Results  Component Value Date   INR 0.97 10/29/2017   No results found for: "CHOL", "HDL", "LDLCALC", "TRIG"   GEN- The patient is well appearing, alert and oriented x 3 today.   Head- normocephalic, atraumatic Eyes-  Sclera clear, conjunctiva pink Ears- hearing intact Oropharynx- clear Neck- supple, no JVP Lymph- no cervical lymphadenopathy Lungs- Clear to ausculation bilaterally, normal work of breathing Heart- irregular rate and rhythm, no murmurs, rubs or gallops, PMI not laterally displaced GI- soft, NT, ND, + BS Extremities- no clubbing, cyanosis, or edema MS- no significant deformity or atrophy Skin- no rash or  lesion Psych- euthymic mood, full affect Neuro- strength and sensation are intact  EKG- Vent. rate 104 BPM PR interval * ms QRS duration 80 ms QT/QTcB 340/447 ms P-R-T axes * 0 36 Atrial fibrillation with rapid ventricular response with premature ventricular or aberrantly conducted complexes Abnormal ECG When compared with ECG of 24-Oct-2021 11:45, PREVIOUS ECG IS PRESENT    Assessment and Plan: 1.Persistent afib Did  very well s/p ablation 2021 without any afib, off amiodarone  after ablation, previous use of flecainide and Rythmol   Recent increase in afib burden, now pending ablation in March but with persistent afib since Saturday, plan is for ablation in March and if no good targets to ablate, Dr. Quentin Ore will admit afterwards and load on tikosyn In the interim for him to be  more comfortable, he will be scheduled for an cardioversion, will have to be week of February 5th as he missed a dose of anticoagulation 09/27/21 Continue  eliquis 5 mg bid  with a chadsvasc score of 2 He can try increasing  metoprolol tartrate to 25 mg bid in hopes it may convert him  Watch HR's and BP at home  Cbc/bmet   2. HTN Stable   F/u here one week after cardioversion   Butch Penny C. Takerra Lupinacci, [facility] [address] [place], [place] [address] [phone]

## 2022-10-01 NOTE — Research (Signed)
[facility] Informed Consent   Subject Name: Alexander Carey  Subject met inclusion and exclusion criteria.  The informed consent form, study requirements and expectations were reviewed with the subject and questions and concerns were addressed prior to the signing of the consent form.  The subject verbalized understanding of the trial requirements.  The subject agreed to participate in the [facility] trial and signed the informed consent at on 17/Jan/2024.  The informed consent was obtained prior to performance of any protocol-specific procedures for the subject.  A copy of the signed informed consent was given to the subject and a copy was placed in the subject's medical record.   [name] [name]

## 2022-10-06 ENCOUNTER — Telehealth (HOSPITAL_COMMUNITY): Payer: Self-pay | Admitting: *Deleted

## 2022-10-06 MED ORDER — DILTIAZEM HCL 30 MG PO TABS: ORAL_TABLET | ORAL | 1 refills | Status: AC

## 2022-10-06 MED ORDER — METOPROLOL TARTRATE 25 MG PO TABS: 12.5000 mg | ORAL_TABLET | Freq: Two times a day (BID) | ORAL | 3 refills | Status: DC

## 2022-10-06 NOTE — Telephone Encounter (Signed)
Patient wife called in stating having hypotension SBP down to 79 this afternoon - felt lightheaded. Feeling better now BP 108/60 having intermittent heart racing in the 140-150s. Discussed with Adline Peals PA will reduce metoprolol back to 12.'5mg'$  BID and use PRN cardizem '30mg'$  for elevated rates as BP will allow. Pt wife verbalized agreement.

## 2022-10-14 ENCOUNTER — Other Ambulatory Visit (HOSPITAL_COMMUNITY): Payer: Self-pay | Admitting: Nurse Practitioner

## 2022-10-14 LAB — CBC WITH DIFFERENTIAL/PLATELET
Basophils Absolute: 0 10*3/uL (ref 0.0–0.2)
Basos: 1 %
EOS (ABSOLUTE): 0.2 10*3/uL (ref 0.0–0.4)
Eos: 3 %
Hematocrit: 39.6 % (ref 37.5–51.0)
Hemoglobin: 14.2 g/dL (ref 13.0–17.7)
Immature Grans (Abs): 0 10*3/uL (ref 0.0–0.1)
Immature Granulocytes: 0 %
Lymphocytes Absolute: 1.3 10*3/uL (ref 0.7–3.1)
Lymphs: 23 %
MCH: 37.1 pg — ABNORMAL HIGH (ref 26.6–33.0)
MCHC: 35.9 g/dL — ABNORMAL HIGH (ref 31.5–35.7)
MCV: 103 fL — ABNORMAL HIGH (ref 79–97)
Monocytes Absolute: 0.6 10*3/uL (ref 0.1–0.9)
Monocytes: 11 %
Neutrophils Absolute: 3.5 10*3/uL (ref 1.4–7.0)
Neutrophils: 62 %
Platelets: 217 10*3/uL (ref 150–450)
RBC: 3.83 x10E6/uL — ABNORMAL LOW (ref 4.14–5.80)
RDW: 12 % (ref 11.6–15.4)
WBC: 5.6 10*3/uL (ref 3.4–10.8)

## 2022-10-15 LAB — BASIC METABOLIC PANEL
BUN/Creatinine Ratio: 9 — ABNORMAL LOW (ref 10–24)
BUN: 8 mg/dL (ref 8–27)
CO2: 21 mmol/L (ref 20–29)
Calcium: 9 mg/dL (ref 8.6–10.2)
Chloride: 100 mmol/L (ref 96–106)
Creatinine, Ser: 0.91 mg/dL (ref 0.76–1.27)
Glucose: 101 mg/dL — ABNORMAL HIGH (ref 70–99)
Potassium: 4.6 mmol/L (ref 3.5–5.2)
Sodium: 135 mmol/L (ref 134–144)
eGFR: 90 mL/min/{1.73_m2} (ref >59)

## 2022-10-16 ENCOUNTER — Telehealth (HOSPITAL_COMMUNITY): Payer: Self-pay | Admitting: *Deleted

## 2022-10-16 NOTE — Telephone Encounter (Signed)
Pt converted back into normal rhythm 2 days ago. Continues in NSR. HR 74 back to metoprolol 12.'5mg'$  BID. Cardioversion canceled.

## 2022-10-21 ENCOUNTER — Ambulatory Visit (HOSPITAL_COMMUNITY): Admission: RE | Admit: 2022-10-21 | Payer: PPO | Source: Home / Self Care | Admitting: Cardiovascular Disease

## 2022-10-21 ENCOUNTER — Encounter (HOSPITAL_COMMUNITY): Admission: RE | Payer: Self-pay | Source: Home / Self Care

## 2022-10-21 SURGERY — CARDIOVERSION
Anesthesia: General

## 2022-10-23 ENCOUNTER — Telehealth (HOSPITAL_COMMUNITY): Payer: Self-pay | Admitting: *Deleted

## 2022-10-23 ENCOUNTER — Encounter (HOSPITAL_COMMUNITY): Payer: Self-pay | Admitting: *Deleted

## 2022-10-23 NOTE — Telephone Encounter (Signed)
Patient back in AF. HRs in the 130s. Per Roderic Palau NP will hold losartan increase metoprolol to '25mg'$  BID and call with update early next week. Pt in agreement,.

## 2022-10-27 ENCOUNTER — Telehealth (HOSPITAL_COMMUNITY): Payer: Self-pay | Admitting: *Deleted

## 2022-10-27 ENCOUNTER — Other Ambulatory Visit: Payer: Self-pay

## 2022-10-27 ENCOUNTER — Other Ambulatory Visit
Admission: RE | Admit: 2022-10-27 | Discharge: 2022-10-27 | Disposition: A | Discharge status: Home / Self Care | Payer: PPO | Attending: Urology | Admitting: Urology

## 2022-10-27 DIAGNOSIS — N138 Other obstructive and reflux uropathy: Secondary | ICD-10-CM | POA: Insufficient documentation

## 2022-10-27 DIAGNOSIS — N401 Enlarged prostate with lower urinary tract symptoms: Secondary | ICD-10-CM | POA: Insufficient documentation

## 2022-10-27 LAB — PSA: Prostatic Specific Antigen: 0.13 ng/mL (ref 0.00–4.00)

## 2022-10-27 MED ORDER — METOPROLOL TARTRATE 25 MG PO TABS: 37.5000 mg | ORAL_TABLET | Freq: Two times a day (BID) | ORAL | 3 refills | Status: DC

## 2022-10-27 MED ORDER — LOSARTAN POTASSIUM 100 MG PO TABS: 100.0000 mg | ORAL_TABLET | Freq: Every day | ORAL | 3 refills | Status: DC

## 2022-10-27 NOTE — Telephone Encounter (Signed)
Pt wife states pt continues in AF. HRs in the 120s. BP 114/63. Discussed with Adline Peals PA will increase metoprolol to 37.66m BID> call with update later in week with HR response.

## 2022-10-29 ENCOUNTER — Ambulatory Visit (HOSPITAL_COMMUNITY): Payer: PPO | Admitting: Nurse Practitioner

## 2022-10-30 NOTE — Progress Notes (Signed)
02/14/20 10:16 AM   Alexander Carey September 02, 1951 [id number]  Referring provider: Valera Castle, [address] [place],  [place] [address]  Chief Complaint  Patient presents with   Benign Prostatic Hypertrophy   Urological History: 1. BPH with LU TS -PSA (10/2022) 0.13 -I PSS 9/0 -dutasteride 0.5 mg daily and tamsulosin 0.4 mg daily  2. ED -Risk factors: former smoker, sleep apnea, BPH, age and anticoagulants  -tadalafil 20 mg, on-demand-dosing  HPI: Alexander Carey is a 72 y.o. male who presents today for a yearly follow up.   I PSS 9/0  He has no urinary complaints this visit.  Patient denies any modifying or aggravating factors.  Patient denies any gross hematuria, dysuria or suprapubic/flank pain.  Patient denies any fevers, chills, nausea or vomiting.    IPSS     Row Name 11/03/22 1000         International Prostate Symptom Score   How often have you had the sensation of not emptying your bladder? Less than 1 in 5     How often have you had to urinate less than every two hours? Less than half the time     How often have you found you stopped and started again several times when you urinated? Less than 1 in 5 times     How often have you found it difficult to postpone urination? Less than 1 in 5 times     How often have you had a weak urinary stream? Less than half the time     How often have you had to strain to start urination? Less than 1 in 5 times     How many times did you typically get up at night to urinate? 1 Time     Total IPSS Score 9       Quality of Life due to urinary symptoms   If you were to spend the rest of your life with your urinary condition just the way it is now how would you feel about that? Delighted                Score:  1-7 Mild 8-19 Moderate 20-35 Severe  PMH: Past Medical History:  Diagnosis Date   AF (atrial fibrillation) (HCC)    Back pain    lower   Basal cell carcinoma 11/01/2019   nasal tip, right ear  concha   ED (erectile dysfunction)    ED (erectile dysfunction)    Enlarged prostate    Essential hypertension    HOH (hard of hearing)    Hypogonadism in male    Hypothyroidism    Paroxysmal A-fib ([place])    a. 07/2018 s/p PVI.    Surgical History: Past Surgical History:  Procedure Laterality Date   ATRIAL FIBRILLATION ABLATION N/A 07/27/2018   Procedure: ATRIAL FIBRILLATION ABLATION;  Surgeon: Thompson Grayer, MD;  Location: [facility];  Service: Cardiovascular;  Laterality: N/A;   CARDIOVERSION N/A 01/13/2018   Procedure: CARDIOVERSION;  Surgeon: Nelva Bush, MD;  Location: [facility];  Service: Cardiovascular;  Laterality: N/A;   CARDIOVERSION N/A 01/26/2018   Procedure: CARDIOVERSION;  Surgeon: Minna Merritts, MD;  Location: [facility];  Service: Cardiovascular;  Laterality: N/A;   CARDIOVERSION N/A 03/01/2018   Procedure: CARDIOVERSION;  Surgeon: Minna Merritts, MD;  Location: [facility];  Service: Cardiovascular;  Laterality: N/A;   CARDIOVERSION N/A 05/21/2018   Procedure: CARDIOVERSION;  Surgeon: Wellington Hampshire, MD;  Location: [facility];  Service: Cardiovascular;  Laterality: N/A;   COLONOSCOPY  04/2005   COLONOSCOPY WITH PROPOFOL N/A 03/28/2020   Procedure: COLONOSCOPY WITH PROPOFOL;  Surgeon: Robert Bellow, MD;  Location: [facility];  Service: Endoscopy;  Laterality: N/A;   INCISION AND DRAINAGE Right 06/26/2021   Procedure: INCISION AND DRAINAGE;  Surgeon: Thornton Park, MD;  Location: [facility];  Service: Orthopedics;  Laterality: Right;   KNEE ARTHROSCOPY Right 06/20/2021   Procedure: RIGHT KNEE DIAGNOSTIC  ARTHROSCOPY;  Surgeon: Thornton Park, MD;  Location: [facility];  Service: Orthopedics;  Laterality: Right;   KNEE ARTHROSCOPY Right 06/26/2021   Procedure: ARTHROSCOPY KNEE;  Surgeon: Thornton Park, MD;  Location: [facility];  Service: Orthopedics;  Laterality: Right;   KNEE ARTHROSCOPY WITH MEDIAL MENISECTOMY Right 05/30/2021   Procedure: KNEE  ARTHROSCOPY WITH MEDIAL AND LATERAL MENISECTOMIES AND CHONDROPLASTY OF THE PATELLA;  Surgeon: Thornton Park, MD;  Location: [facility];  Service: Orthopedics;  Laterality: Right;   TONSILLECTOMY     TRIGGER FINGER RELEASE Left 12/03/2016   Procedure: 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 If you have any questions, ask your nurse or doctor.          acetaminophen 650 MG CR tablet Commonly known as: TYLENOL Take 1 tablet (650 mg total) by mouth as needed for pain (mild).   albuterol 108 (90 Base) MCG/ACT inhaler Commonly known as: VENTOLIN HFA Inhale 1-2 puffs into the lungs every 6 (six) hours as needed for wheezing or shortness of breath.   Colace 100 MG capsule Generic drug: docusate sodium Take 100 mg by mouth as needed.   diltiazem 30 MG tablet Commonly known as: Cardizem Take 1 tablet every 4 hours AS NEEDED for AFIB heart rate >110 as long as top BP >100.   dutasteride 0.5 MG capsule Commonly known as: AVODART TAKE ONE (1) CAPSULE EACH DAY.   Eliquis 5 MG Tabs tablet Generic drug: apixaban TAKE (1) TABLET BY MOUTH TWICE DAILY   ipratropium 0.06 % nasal spray Commonly known as: ATROVENT Place into both nostrils.   Ipratropium-Albuterol 20-100 MCG/ACT Aers respimat Commonly known as: COMBIVENT Inhale 1 puff into the lungs every 6 (six) hours as needed for wheezing or shortness of breath.   levothyroxine 25 MCG tablet Commonly known as: SYNTHROID Take 1 tablet (25 mcg total) by mouth daily before breakfast.   losartan 100 MG tablet Commonly known as: COZAAR Take 1 tablet (100 mg total) by  mouth daily. HOLD WHILE ON HIGHER DOSE OF METOPROLOL   metoprolol tartrate 25 MG tablet Commonly known as: LOPRESSOR Take 1.5 tablets (37.5 mg total) by mouth 2 (two) times daily.   tamsulosin 0.4 MG Caps capsule Commonly known as: FLOMAX TAKE (1) CAPSULE BY MOUTH EVERY DAY   triamcinolone ointment 0.1 % Commonly known as: KENALOG Apply to aa's rash BID up to two weeks. Avoid applying to face, groin, and axilla. Use as directed. Long-term use can cause thinning of the skin.        Allergies:  Allergies  Allergen Reactions   Statins  Other (See Comments)    Patient declines any statins   Ciprofloxacin Rash    Family History: Family History  Problem Relation Age of Onset   Ovarian cancer Mother    Throat cancer Father    Prostate cancer Neg Hx    Kidney cancer Neg Hx     Social History:  reports that he quit smoking about 14 years ago. His smoking use included cigarettes. He smoked an average of .5 packs per day. He has never used smokeless tobacco. He reports current alcohol use. He reports that he does not use drugs.   Physical Exam: BP (!) 100/58 (BP Location: Left Arm, Patient Position: Sitting, Cuff Size: Large)   Pulse 85   Ht 6' (1.829 m)   Wt 236 lb 9.6 oz (107.3 kg)   SpO2 98%   BMI 32.09 kg/m   Constitutional:  Well nourished. Alert and oriented, No acute distress. HEENT: [place] AT, moist mucus membranes.  Trachea midline Cardiovascular: No clubbing, cyanosis, or edema. Respiratory: Normal respiratory effort, no increased work of breathing. GU: No CVA tenderness.  No bladder fullness or masses.  Patient with uncircumcised phallus.  Foreskin easily retracted  Urethral meatus is patent.  No penile discharge. No penile lesions or rashes. Scrotum without lesions, cysts, rashes and/or edema.  Testicles are located scrotally bilaterally. No masses are appreciated in the testicles. Left and right epididymis are normal. Rectal: Patient with  normal sphincter tone. Anus  and perineum without scarring or rashes. No rectal masses are appreciated. Prostate is approximately 40 + grams, could only palpate the apex and the midportion of the gland, no nodules are appreciated. Seminal vesicles could not be palpated Neurologic: Grossly intact, no focal deficits, moving all 4 extremities. Psychiatric: Normal mood and affect.   Laboratory Data: Serum creatinine (10/2022) 0.9 I have reviewed the labs.  Pertinent Imaging: No recent imaging   Assessment & Plan:    1. BPH with LUTS -PSA stable -DRE benign -symptoms - weak stream  -continue conservative management, avoiding bladder irritants and timed voiding's -Continue tamsulosin 0.4 mg daily and dutasteride 0.5 mg daily    2. Erectile dysfunction -Not a sexually active as in the past -No need for medication refills at this visit  Return in about 1 year (around 11/04/2023) for IPSS, PSA and exam.   Zara Council, PA-C   [facility] [address] [place], [place] [address] [phone]

## 2022-10-31 ENCOUNTER — Other Ambulatory Visit (HOSPITAL_COMMUNITY): Payer: Self-pay | Admitting: *Deleted

## 2022-10-31 DIAGNOSIS — I4819 Other persistent atrial fibrillation: Secondary | ICD-10-CM

## 2022-10-31 NOTE — Telephone Encounter (Signed)
Pt unable to tolerate higher dose of metoprolol due to hypotension. Pt continues to feel poorly. Per Adline Peals PA since continued persistent will schedule for cardioversion. Pt in agreement and cardioversion is scheduled for 3/5.

## 2022-11-03 ENCOUNTER — Encounter: Payer: Self-pay | Admitting: Urology

## 2022-11-03 ENCOUNTER — Ambulatory Visit (INDEPENDENT_AMBULATORY_CARE_PROVIDER_SITE_OTHER): Payer: PPO | Admitting: Urology

## 2022-11-03 VITALS — BP 100/58 | HR 85 | Ht 72.0 in | Wt 236.6 lb

## 2022-11-03 DIAGNOSIS — N529 Male erectile dysfunction, unspecified: Secondary | ICD-10-CM | POA: Diagnosis not present

## 2022-11-03 DIAGNOSIS — N401 Enlarged prostate with lower urinary tract symptoms: Secondary | ICD-10-CM | POA: Diagnosis not present

## 2022-11-03 DIAGNOSIS — R3912 Poor urinary stream: Secondary | ICD-10-CM | POA: Diagnosis not present

## 2022-11-03 DIAGNOSIS — N138 Other obstructive and reflux uropathy: Secondary | ICD-10-CM

## 2022-11-03 MED ORDER — DUTASTERIDE 0.5 MG PO CAPS: ORAL_CAPSULE | ORAL | 3 refills | Status: DC

## 2022-11-06 ENCOUNTER — Ambulatory Visit (INDEPENDENT_AMBULATORY_CARE_PROVIDER_SITE_OTHER): Payer: PPO | Admitting: Dermatology

## 2022-11-06 VITALS — BP 121/68 | HR 69

## 2022-11-06 DIAGNOSIS — C4401 Basal cell carcinoma of skin of lip: Secondary | ICD-10-CM | POA: Diagnosis not present

## 2022-11-06 DIAGNOSIS — Z85828 Personal history of other malignant neoplasm of skin: Secondary | ICD-10-CM

## 2022-11-06 DIAGNOSIS — L578 Other skin changes due to chronic exposure to nonionizing radiation: Secondary | ICD-10-CM

## 2022-11-06 DIAGNOSIS — L570 Actinic keratosis: Secondary | ICD-10-CM | POA: Diagnosis not present

## 2022-11-06 DIAGNOSIS — L821 Other seborrheic keratosis: Secondary | ICD-10-CM

## 2022-11-06 DIAGNOSIS — D485 Neoplasm of uncertain behavior of skin: Secondary | ICD-10-CM

## 2022-11-06 MED ORDER — FLUOROURACIL 5 % EX CREA: TOPICAL_CREAM | CUTANEOUS | 1 refills | Status: DC

## 2022-11-06 NOTE — Progress Notes (Signed)
Follow-Up Visit   Subjective  Alexander Carey is a 72 y.o. male who presents for the following: Irregular skin lesion (Wife noticed a black skin lesion on his back last night and is concerned due to fhx of MM). The patient has spots, moles and lesions to be evaluated, some may be new or changing and the patient has concerns that these could be cancer.  The following portions of the chart were reviewed this encounter and updated as appropriate:   Tobacco  Allergies  Meds  Problems  Med Hx  Surg Hx  Fam Hx      Review of Systems:  No other skin or systemic complaints except as noted in HPI or Assessment and Plan.  Objective  Well appearing patient in no apparent distress; mood and affect are within normal limits.  A focused examination was performed including the face, trunk, and extremities. Relevant physical exam findings are noted in the Assessment and Plan.  Nose, face, ears Erythematous thin papules/macules with gritty scale.          Left Upper Back Crusted papule.      Left Lower Cutaneous Lip Sup 0.7 cm pink papule.     L lower cutaneous lip inferior 0.6 cm pink papule.     Assessment & Plan  AK (actinic keratosis) Nose, face, ears  Actinic keratoses are precancerous spots that appear secondary to cumulative UV radiation exposure/sun exposure over time. They are chronic with expected duration over 1 year. A portion of actinic keratoses will progress to squamous cell carcinoma of the skin. It is not possible to reliably predict which spots will progress to skin cancer and so treatment is recommended to prevent development of skin cancer.  Recommend daily broad spectrum sunscreen SPF 30+ to sun-exposed areas, reapply every 2 hours as needed.  Recommend staying in the shade or wearing long sleeves, sun glasses (UVA+UVB protection) and wide brim hats (4-inch brim around the entire circumference of the hat). Call for new or changing lesions.  Discussed  LN2 vs topical chemotherapy. Pt prefers topical chemotherapy   Start 5FU/Calcipotriene mix BID x 7 days to the ear, and BID x 4 days to the face. After healed from treatment if still scaly may repeat treatment. Areas debrided today.   Reviewed course of treatment and expected reaction.  Patient advised to expect inflammation and crusting and advised that erosions are possible.  Patient advised to be diligent with sun protection during and after treatment. Handout with details of how to apply medication and what to expect provided. Counseled to keep medication out of reach of children and pets.   fluorouracil (EFUDEX) 5 % cream - Nose, face, ears Apply to affected areas of the face BID x 4 days and the ears BID x 7 days.  Neoplasm of uncertain behavior of skin (3) Left Upper Back  Left Lower Cutaneous Lip Sup  Skin / nail biopsy Type of biopsy: tangential   Informed consent: discussed and consent obtained   Timeout: patient name, date of birth, surgical site, and procedure verified   Procedure prep:  Patient was prepped and draped in usual sterile fashion Prep type:  Isopropyl alcohol Anesthesia: the lesion was anesthetized in a standard fashion   Anesthetic:  1% lidocaine w/ epinephrine 1-100,000 buffered w/ 8.4% NaHCO3 Instrument used: flexible razor blade   Hemostasis achieved with: pressure, aluminum chloride and electrodesiccation   Outcome: patient tolerated procedure well   Post-procedure details: sterile dressing applied and wound care instructions given  Dressing type: bandage and petrolatum    Specimen 1 - Surgical pathology Differential Diagnosis: D48.5 r/o BCC vs other Check Margins: No  L lower cutaneous lip inferior  Skin / nail biopsy Type of biopsy: tangential   Informed consent: discussed and consent obtained   Timeout: patient name, date of birth, surgical site, and procedure verified   Procedure prep:  Patient was prepped and draped in usual sterile  fashion Prep type:  Isopropyl alcohol Anesthesia: the lesion was anesthetized in a standard fashion   Anesthetic:  1% lidocaine w/ epinephrine 1-100,000 buffered w/ 8.4% NaHCO3 Instrument used: flexible razor blade   Hemostasis achieved with: pressure, aluminum chloride and electrodesiccation   Outcome: patient tolerated procedure well   Post-procedure details: sterile dressing applied and wound care instructions given   Dressing type: bandage and petrolatum    Specimen 2 - Surgical pathology Differential Diagnosis: D48.5 r/o BCC Check Margins: No  Favor ISK at the L upper back, crust removed today. Will recheck at follow up appointment. Call for changes.   Seborrheic Keratoses - Stuck-on, waxy, tan-brown papules and/or plaques  - Benign-appearing - Discussed benign etiology and prognosis. - Observe - Call for any changes  Actinic Damage - chronic, secondary to cumulative UV radiation exposure/sun exposure over time - diffuse scaly erythematous macules with underlying dyspigmentation - Recommend daily broad spectrum sunscreen SPF 30+ to sun-exposed areas, reapply every 2 hours as needed.  - Recommend staying in the shade or wearing long sleeves, sun glasses (UVA+UVB protection) and wide brim hats (4-inch brim around the entire circumference of the hat). - Call for new or changing lesions.  History of Basal Cell Carcinoma of the Skin - No evidence of recurrence today - Recommend regular full body skin exams - Recommend daily broad spectrum sunscreen SPF 30+ to sun-exposed areas, reapply every 2 hours as needed.  - Call if any new or changing lesions are noted between office visits  Return in about 3 months (around 02/04/2023) for TBSE - recheck previously mentioned lesions.  Luther Redo, CMA, am acting as scribe for Forest Gleason, MD .  Documentation: I have reviewed the above documentation for accuracy and completeness, and I agree with the above.  Forest Gleason, MD

## 2022-11-06 NOTE — Patient Instructions (Signed)
In case of bleeding, recommend holding firm pressure for 10 minutes without removing pressure. If there is still bleeding, recommend OTC BleedStop Powder available at [employer]. This can be sprinkled onto a clean wound and will help stop bleeding.    Wound Care Instructions  Cleanse wound gently with soap and water once a day then pat dry with clean gauze. Apply a thin coat of Petrolatum (petroleum jelly, "Vaseline") over the wound (unless you have an allergy to this). We recommend that you use a new, sterile tube of Vaseline. Do not pick or remove scabs. Do not remove the yellow or white "healing tissue" from the base of the wound.  Cover the wound with fresh, clean, nonstick gauze and secure with paper tape. You may use Band-Aids in place of gauze and tape if the wound is small enough, but would recommend trimming much of the tape off as there is often too much. Sometimes Band-Aids can irritate the skin.  You should call the office for your biopsy report after 1 week if you have not already been contacted.  If you experience any problems, such as abnormal amounts of bleeding, swelling, significant bruising, significant pain, or evidence of infection, please call the office immediately.  FOR ADULT SURGERY PATIENTS: If you need something for pain relief you may take 1 extra strength Tylenol (acetaminophen) AND 2 Ibuprofen (272m each) together every 4 hours as needed for pain. (do not take these if you are allergic to them or if you have a reason you should not take them.) Typically, you may only need pain medication for 1 to 3 days.   Instructions for [facility] Medications  One or more of your medications was sent to the [facility] mail order compounding pharmacy. You will receive an email from them and can purchase the medicine through that link. It will then be mailed to your home at the address you confirmed. If for any reason you do not receive an email from them, please check your  spam folder. If you still do not find the email, please let u[place]. [facility] phone number is [phone]   5-Fluorouracil/Calcipotriene Patient Education   Actinic keratoses are the dry, red scaly spots on the skin caused by sun damage. A portion of these spots can turn into skin cancer with time, and treating them can help prevent development of skin cancer.   Treatment of these spots requires removal of the defective skin cells. There are various ways to remove actinic keratoses, including freezing with liquid nitrogen, treatment with creams, or treatment with a blue light procedure in the office.   5-fluorouracil cream is a topical cream used to treat actinic keratoses. It works by interfering with the growth of abnormal fast-growing skin cells, such as actinic keratoses. These cells peel off and are replaced by healthy ones. THIS CREAM SHOULD BE KEPT OUT OF REACH OF CHILDREN AND PETS AND SHOULD NOT BE USED BY PREGNANT WOMEN.  5-fluorouracil/calcipotriene is a combination of the 5-fluorouracil cream with a vitamin D analog cream called calcipotriene. The calcipotriene alone does not treat actinic keratoses. However, when it is combined with 5-fluorouracil, it helps the 5-fluorouracil treat the actinic keratoses much faster so that the same results can be achieved with a much shorter treatment time.  INSTRUCTIONS FOR 5-FLUOROURACIL/CALCIPOTRIENE CREAM:   5-fluorouracil/calcipotriene cream typically only needs to be used for 4-7 days. A thin layer should be applied twice a day to the treatment areas recommended by your physician.   If  your physician prescribed you separate tubes of 5-fluourouracil and calcipotriene, apply a thin layer of 5-fluorouracil followed by a thin layer of calcipotriene.   Avoid contact with your eyes or nostrils. Avoid applying the cream to your eyelids or lips unless directed to apply there by your physician. Do not use 5-fluorouracil/calcipotriene cream on  infected or open wounds.   You will develop redness, irritation and some crusting at areas where you have pre-cancer damage/actinic keratoses. IF YOU DEVELOP PAIN, BLEEDING, OR SIGNIFICANT CRUSTING, STOP THE TREATMENT EARLY - you have already gotten a good response and the actinic keratoses should clear up well.  Wash your hands after applying 5-fluorouracil 5% cream on your skin.   A moisturizer or sunscreen with a minimum SPF 30 should be applied each morning.   Once you have finished the treatment, you can apply a thin layer of Vaseline twice a day to irritated areas to soothe and calm the areas more quickly. If you experience significant discomfort, contact your physician.  For some patients it is necessary to repeat the treatment for best results.  SIDE EFFECTS: When using 5-fluorouracil/calcipotriene cream, you may have mild irritation, such as redness, dryness, swelling, or a mild burning sensation. This usually resolves within 2 weeks. The more actinic keratoses you have, the more redness and inflammation you can expect during treatment. Eye irritation has been reported rarely. If this occurs, please let us know.   If you have any trouble using this cream, please send [place] a MyChart message or call the office. If you have any other questions about this information, please do not hesitate to ask me before you leave the office or contact me on MyChart or by phone.  Due to recent changes in healthcare laws, you may see results of your pathology and/or laboratory studies on MyChart before the doctors have had a chance to review them. We understand that in some cases there may be results that are confusing or concerning to you. Please understand that not all results are received at the same time and often the doctors may need to interpret multiple results in order to provide you with the best plan of care or course of treatment. Therefore, we ask that you please give [place] 2 business days to thoroughly  review all your results before contacting the office for clarification. Should we see a critical lab result, you will be contacted sooner.   If You Need Anything After Your Visit  If you have any questions or concerns for your doctor, please call our main line at [phone] and press option 4 to reach your doctor's medical assistant. If no one answers, please leave a voicemail as directed and we will return your call as soon as possible. Messages left after 4 pm will be answered the following business day.   You may also send [place] a message via [place]. We typically respond to MyChart messages within 1-2 business days.  For prescription refills, please ask your pharmacy to contact our office. Our fax number is [phone].  If you have an urgent issue when the clinic is closed that cannot wait until the next business day, you can page your doctor at the number below.    Please note that while we do our best to be available for urgent issues outside of office hours, we are not available 24/7.   If you have an urgent issue and are unable to reach [place], you may choose to seek medical care at your doctor's office, retail  clinic, urgent care center, or emergency room.  If you have a medical emergency, please immediately call 911 or go to the emergency department.  Pager Numbers  - Dr. Nehemiah Massed: [phone]  - Dr. Laurence Ferrari: [phone]  - Dr. Nicole Kindred: [phone]  In the event of inclement weather, please call our main line at [phone] for an update on the status of any delays or closures.  Dermatology Medication Tips: Please keep the boxes that topical medications come in in order to help keep track of the instructions about where and how to use these. Pharmacies typically print the medication instructions only on the boxes and not directly on the medication tubes.   If your medication is too expensive, please contact our office at [phone] option 4 or send [place] a message through  [place].   We are unable to tell what your co-pay for medications will be in advance as this is different depending on your insurance coverage. However, we may be able to find a substitute medication at lower cost or fill out paperwork to get insurance to cover a needed medication.   If a prior authorization is required to get your medication covered by your insurance company, please allow [place] 1-2 business days to complete this process.  Drug prices often vary depending on where the prescription is filled and some pharmacies may offer cheaper prices.  The website www.goodrx.com contains coupons for medications through different pharmacies. The prices here do not account for what the cost may be with help from insurance (it may be cheaper with your insurance), but the website can give you the price if you did not use any insurance.  - You can print the associated coupon and take it with your prescription to the pharmacy.  - You may also stop by our office during regular business hours and pick up a GoodRx coupon card.  - If you need your prescription sent electronically to a different pharmacy, notify our office through [facility] or by phone at [phone] option 4.     Si Usted Necesita Algo Despus de Su Visita  Tambin puede enviarnos un mensaje a travs de Pharmacist, community. Por lo general respondemos a los mensajes de MyChart en el transcurso de 1 a 2 das hbiles.  Para renovar recetas, por favor pida a su farmacia que se ponga en contacto con nuestra oficina. Harland Dingwall de fax es [place] [phone].  Si tiene un asunto urgente cuando la clnica est cerrada y que no puede esperar hasta el siguiente da hbil, puede llamar/localizar a su doctor(a) al nmero que aparece a continuacin.   Por favor, tenga en cuenta que aunque hacemos todo lo posible para estar disponibles para asuntos urgentes fuera del horario de [place], no estamos disponibles las 24 horas del da, los 7 das de la  [place].   Si tiene un problema urgente y no puede comunicarse con nosotros, puede optar por buscar atencin mdica  en el consultorio de su doctor(a), en una clnica privada, en un centro de atencin urgente o en una sala de emergencias.  Si tiene Engineering geologist, por favor llame inmediatamente al 911 o vaya a la sala de emergencias.  Nmeros de bper  - Dr. Nehemiah Massed: [phone]  - Dra. Moye: [phone]  - Dra. Nicole Kindred: [phone]  En caso de inclemencias del [place], por favor llame a Johnsie Kindred principal al [phone] para una actualizacin sobre el [place] de cualquier retraso o cierre.  Consejos para la medicacin en dermatologa: Por favor, guarde las  cajas en las que vienen los medicamentos de uso tpico para ayudarle a seguir las instrucciones sobre dnde y cmo usarlos. Las farmacias generalmente imprimen las instrucciones del medicamento slo en las cajas y no directamente en los tubos del [place].   Si su medicamento es muy caro, por favor, pngase en contacto con Zigmund Daniel llamando al [phone] y presione la opcin 4 o envenos un mensaje a travs de Pharmacist, community.   No podemos decirle cul ser su copago por los medicamentos por adelantado ya que esto es diferente dependiendo de la cobertura de su seguro. Sin embargo, es posible que podamos encontrar un medicamento sustituto a Electrical engineer un formulario para que el seguro cubra el medicamento que se considera necesario.   Si se requiere una autorizacin previa para que su compaa de seguros [place] su medicamento, por favor permtanos de 1 a 2 das hbiles para completar este proceso.  Los precios de los medicamentos varan con frecuencia dependiendo del Environmental consultant de dnde se surte la receta y alguna farmacias pueden ofrecer precios ms baratos.  El sitio web www.goodrx.com tiene cupones para medicamentos de Airline pilot. Los precios aqu no tienen en cuenta lo que podra costar con la ayuda del  seguro (puede ser ms barato con su seguro), pero el sitio web puede darle el precio si no utiliz Research scientist (physical sciences).  - Puede imprimir el cupn correspondiente y llevarlo con su receta a la farmacia.  - Tambin puede pasar por nuestra oficina durante el horario de atencin regular y Charity fundraiser una tarjeta de cupones de GoodRx.  - Si necesita que su receta se enve electrnicamente a una farmacia diferente, informe a nuestra oficina a travs de MyChart de [place] o por telfono llamando al [phone] y presione la opcin 4.

## 2022-11-08 ENCOUNTER — Encounter: Payer: Self-pay | Admitting: Dermatology

## 2022-11-12 ENCOUNTER — Ambulatory Visit (HOSPITAL_COMMUNITY)
Admission: RE | Admit: 2022-11-12 | Discharge: 2022-11-12 | Disposition: A | Discharge status: Home / Self Care | Payer: PPO | Source: Ambulatory Visit | Attending: Nurse Practitioner | Admitting: Nurse Practitioner

## 2022-11-12 VITALS — BP 132/90 | HR 96 | Ht 72.0 in | Wt 236.0 lb

## 2022-11-12 DIAGNOSIS — I10 Essential (primary) hypertension: Secondary | ICD-10-CM | POA: Insufficient documentation

## 2022-11-12 DIAGNOSIS — I4819 Other persistent atrial fibrillation: Secondary | ICD-10-CM | POA: Insufficient documentation

## 2022-11-12 DIAGNOSIS — Z7901 Long term (current) use of anticoagulants: Secondary | ICD-10-CM | POA: Diagnosis not present

## 2022-11-12 DIAGNOSIS — D6869 Other thrombophilia: Secondary | ICD-10-CM | POA: Diagnosis not present

## 2022-11-12 DIAGNOSIS — Z79899 Other long term (current) drug therapy: Secondary | ICD-10-CM | POA: Insufficient documentation

## 2022-11-12 LAB — BASIC METABOLIC PANEL
Anion gap: 8 (ref 5–15)
BUN: 10 mg/dL (ref 8–23)
CO2: 24 mmol/L (ref 22–32)
Calcium: 9 mg/dL (ref 8.9–10.3)
Chloride: 102 mmol/L (ref 98–111)
Creatinine, Ser: 0.86 mg/dL (ref 0.61–1.24)
GFR, Estimated: >60 mL/min (ref >60)
Glucose, Bld: 100 mg/dL — ABNORMAL HIGH (ref 70–99)
Potassium: 4.5 mmol/L (ref 3.5–5.1)
Sodium: 134 mmol/L — ABNORMAL LOW (ref 135–145)

## 2022-11-12 LAB — CBC
HCT: 40.6 % (ref 39.0–52.0)
Hemoglobin: 13.5 g/dL (ref 13.0–17.0)
MCH: 35.6 pg — ABNORMAL HIGH (ref 26.0–34.0)
MCHC: 33.3 g/dL (ref 30.0–36.0)
MCV: 107.1 fL — ABNORMAL HIGH (ref 80.0–100.0)
Platelets: 172 10*3/uL (ref 150–400)
RBC: 3.79 MIL/uL — ABNORMAL LOW (ref 4.22–5.81)
RDW: 13.1 % (ref 11.5–15.5)
WBC: 4.9 10*3/uL (ref 4.0–10.5)
nRBC: 0 % (ref 0.0–0.2)

## 2022-11-12 NOTE — H&P (View-Only) (Signed)
Primary Care Physician: Valera Castle, MD Referring Physician: Dr. Camille Bal is a 72 y.o. male with a h/o persistent afib with termination pauses, failing flecainide, Rythmol and currently on  Amiodarone. He is in the afib clinic for one month follow up from ablation. He is in SR. Has not noted any afib since the procedure.  No swallowing or groin  issues.  F/u in afib clinic 10/24/21. He is here for one year f/u. He has been off amiodarone since shortly after ablation and is staying in SR. However, he has noted BP's at hone running above 150 sys. It is 160/86 in the clinic today.  F/u in afib clinic 10/01/22. He recently saw Dr. Quentin Ore and is pending an ablation in March. He is now in the afib clinic as he went back into afib on Saturday and missed a dose of his anticoagulation on Sunday. He feels weak and dizzy in afib. He has resumed SR spontaneously in the past after 1-2 days. He has tried an occasional extra 1/2 metoprolol since Saturday.Marland Kitchen His v rate today is 104 bpm. Dr. Quentin Ore  plans to admit him for Tikosyn after the ablation if he does not see any good targets to ablate.  F/u in  afib clinic, 11/12/22, as pt did not require cardioversion on last visit as he went back into SR so it was cancelled. However, he went back into afib within 3 days and is uncomfortable with fatigue, shortness of breath and dizziness. He is pending ablation end of March but wants to see if he can resume SR  for a few weeks prior to ablation so he can feel rested prior to going into procedure.  EKG today shows afib at 96 bpm. No missed anticoagulation.   Today, he denies symptoms of palpitations, chest pain, shortness of breath, orthopnea, PND, lower extremity edema, dizziness, presyncope, syncope, or neurologic sequela. The patient is tolerating medications without difficulties and is otherwise without complaint today.   Past Medical History:  Diagnosis Date   AF (atrial fibrillation) (HCC)     Back pain    lower   Basal cell carcinoma 11/01/2019   nasal tip, right ear concha   ED (erectile dysfunction)    ED (erectile dysfunction)    Enlarged prostate    Essential hypertension    HOH (hard of hearing)    Hypogonadism in male    Hypothyroidism    Paroxysmal A-fib ([place])    a. 07/2018 s/p PVI.   Past Surgical History:  Procedure Laterality Date   ATRIAL FIBRILLATION ABLATION N/A 07/27/2018   Procedure: ATRIAL FIBRILLATION ABLATION;  Surgeon: Thompson Grayer, MD;  Location: [facility];  Service: Cardiovascular;  Laterality: N/A;   CARDIOVERSION N/A 01/13/2018   Procedure: CARDIOVERSION;  Surgeon: Nelva Bush, MD;  Location: [facility];  Service: Cardiovascular;  Laterality: N/A;   CARDIOVERSION N/A 01/26/2018   Procedure: CARDIOVERSION;  Surgeon: Minna Merritts, MD;  Location: [facility];  Service: Cardiovascular;  Laterality: N/A;   CARDIOVERSION N/A 03/01/2018   Procedure: CARDIOVERSION;  Surgeon: Minna Merritts, MD;  Location: [facility];  Service: Cardiovascular;  Laterality: N/A;   CARDIOVERSION N/A 05/21/2018   Procedure: CARDIOVERSION;  Surgeon: Wellington Hampshire, MD;  Location: [facility];  Service: Cardiovascular;  Laterality: N/A;   COLONOSCOPY  04/2005   COLONOSCOPY WITH PROPOFOL N/A 03/28/2020   Procedure: COLONOSCOPY WITH PROPOFOL;  Surgeon: Robert Bellow, MD;  Location: [facility];  Service: Endoscopy;  Laterality:  N/A;   INCISION AND DRAINAGE Right 06/26/2021   Procedure: INCISION AND DRAINAGE;  Surgeon: Thornton Park, MD;  Location: [facility];  Service: Orthopedics;  Laterality: Right;   KNEE ARTHROSCOPY Right 06/20/2021   Procedure: RIGHT KNEE DIAGNOSTIC  ARTHROSCOPY;  Surgeon: Thornton Park, MD;  Location: [facility];  Service: Orthopedics;  Laterality: Right;   KNEE ARTHROSCOPY Right 06/26/2021   Procedure: ARTHROSCOPY KNEE;  Surgeon: Thornton Park, MD;  Location: [facility];  Service: Orthopedics;  Laterality: Right;   KNEE ARTHROSCOPY  WITH MEDIAL MENISECTOMY Right 05/30/2021   Procedure: KNEE ARTHROSCOPY WITH MEDIAL AND LATERAL MENISECTOMIES AND CHONDROPLASTY OF THE PATELLA;  Surgeon: Thornton Park, MD;  Location: [place];  Service: Orthopedics;  Laterality: Right;   TONSILLECTOMY     TRIGGER FINGER RELEASE Left 12/03/2016   Procedure: 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     albuterol (VENTOLIN HFA) 108 (90 Base) MCG/ACT inhaler Inhale 1-2 puffs into the lungs every 6 (six) hours as needed for wheezing or shortness of breath.     apixaban (ELIQUIS) 5 MG TABS tablet TAKE (1) TABLET BY MOUTH TWICE DAILY 180 tablet 2   diltiazem (CARDIZEM) 30 MG tablet Take 1 tablet every 4 hours AS NEEDED for AFIB heart rate >110 as long as top BP >100. 30 tablet 1   docusate sodium (COLACE) 100 MG capsule Take 100 mg by mouth as needed.     dutasteride (AVODART) 0.5 MG capsule TAKE ONE (1) CAPSULE EACH DAY. 90 capsule 3   fluorouracil (EFUDEX) 5 % cream Apply to affected areas of the face BID x 4 days and the ears BID x 7 days. 30 g 1   ipratropium (ATROVENT) 0.06 % nasal spray Place into both nostrils.     Ipratropium-Albuterol (COMBIVENT) 20-100 MCG/ACT AERS respimat Inhale 1 puff into the lungs every 6 (six) hours as needed for wheezing or shortness of breath.      levothyroxine (SYNTHROID, LEVOTHROID) 25 MCG tablet Take 1 tablet (25 mcg total) by mouth daily before breakfast. 30 tablet 1   losartan (COZAAR) 100 MG tablet Take 1 tablet (100 mg total) by mouth daily. HOLD WHILE ON HIGHER DOSE OF METOPROLOL 30 tablet 3   metoprolol tartrate (LOPRESSOR) 25 MG tablet Take 1.5 tablets (37.5 mg total) by mouth 2 (two) times  daily. 60 tablet 3   tamsulosin (FLOMAX) 0.4 MG CAPS capsule TAKE (1) CAPSULE BY MOUTH EVERY DAY 90 capsule 0   triamcinolone ointment (KENALOG) 0.1 % Apply to aa's rash BID up to two weeks. Avoid applying to face, groin, and axilla. Use as directed. Long-term use can cause thinning of the skin. 80 g 0   No current facility-administered medications for this encounter.    Allergies  Allergen Reactions   Statins Other (See Comments)    Patient declines any statins   Ciprofloxacin Rash    Social History   Socioeconomic History   Marital status: Married    Spouse name: Not on file   Number of children: Not on file   Years of education: Not on file   Highest education level: Not on file  Occupational History   Not on file  Tobacco Use  Smoking status: Former    Packs/day: 0.50    Types: Cigarettes    Quit date: 09/15/2008    Years since quitting: 14.1   Smokeless tobacco: Never  Vaping Use   Vaping Use: Never used  Substance and Sexual Activity   Alcohol use: Yes    Comment: on the weekend- has a few shots   Drug use: No   Sexual activity: Not on file  Other Topics Concern   Not on file  Social History Narrative   Lives in [place] with spouse   Owns rental property   Social Determinants of Health   Financial Resource Strain: Not on file  Food Insecurity: Not on file  Transportation Needs: Not on file  Physical Activity: Not on file  Stress: Not on file  Social Connections: Not on file  Intimate Partner Violence: Not on file    Family History  Problem Relation Age of Onset   Ovarian cancer Mother    Throat cancer Father    Prostate cancer Neg Hx    Kidney cancer Neg Hx     ROS- All systems are reviewed and negative except as per the HPI above  Physical Exam: There were no vitals filed for this visit.  Wt Readings from Last 3 Encounters:  11/03/22 107.3 kg  10/01/22 105.1 kg  08/27/22 105.7 kg    Labs: Lab Results  Component Value Date   NA 135  10/14/2022   K 4.6 10/14/2022   CL 100 10/14/2022   CO2 21 10/14/2022   GLUCOSE 101 (H) 10/14/2022   BUN 8 10/14/2022   CREATININE 0.91 10/14/2022   CALCIUM 9.0 10/14/2022   Lab Results  Component Value Date   INR 0.97 10/29/2017   No results found for: "CHOL", "HDL", "LDLCALC", "TRIG"   GEN- The patient is well appearing, alert and oriented x 3 today.   Head- normocephalic, atraumatic Eyes-  Sclera clear, conjunctiva pink Ears- hearing intact Oropharynx- clear Neck- supple, no JVP Lymph- no cervical lymphadenopathy Lungs- Clear to ausculation bilaterally, normal work of breathing Heart- irregular rate and rhythm, no murmurs, rubs or gallops, PMI not laterally displaced GI- soft, NT, ND, + BS Extremities- no clubbing, cyanosis, or edema MS- no significant deformity or atrophy Skin- no rash or lesion Psych- euthymic mood, full affect Neuro- strength and sensation are intact  EKG- Vent. rate 96 BPM PR interval * ms QRS duration 84 ms QT/QTcB 354/447 ms P-R-T axes * 2 57 Atrial fibrillation Low voltage QRS Abnormal ECG When compared with ECG of 01-Oct-2022 14:23, PREVIOUS ECG IS PRESENT   Assessment and Plan: 1.Persistent afib Did  very well s/p ablation 2021 without any afib, off amiodarone  after ablation, previous use of flecainide and Rythmol   Recent increase in afib burden, now pending ablation in March but with persistent symptomatic  afib since for several weeks now,  plan is for cardioversion 3/5 and then ablation  March 26th  and if no good targets to ablate, Dr. Quentin Ore will admit afterwards and load on tikosyn Continue  eliquis 5 mg bid  with a chadsvasc score of 2, states no missed doses for at least 21 days  Continue  metoprolol tartrate  25 mg bid  Cbc/bmet today   2. HTN Stable  `  Edelin Fryer C. Arma Reining, [facility] [address] [place], [place] [address] [phone]

## 2022-11-12 NOTE — Progress Notes (Signed)
Primary Care Physician: Valera Castle, MD Referring Physician: Dr. Camille Bal is a 73 y.o. male with a h/o persistent afib with termination pauses, failing flecainide, Rythmol and currently on  Amiodarone. He is in the afib clinic for one month follow up from ablation. He is in SR. Has not noted any afib since the procedure.  No swallowing or groin  issues.  F/u in afib clinic 10/24/21. He is here for one year f/u. He has been off amiodarone since shortly after ablation and is staying in SR. However, he has noted BP's at hone running above 150 sys. It is 160/86 in the clinic today.  F/u in afib clinic 10/01/22. He recently saw Dr. Quentin Ore and is pending an ablation in March. He is now in the afib clinic as he went back into afib on Saturday and missed a dose of his anticoagulation on Sunday. He feels weak and dizzy in afib. He has resumed SR spontaneously in the past after 1-2 days. He has tried an occasional extra 1/2 metoprolol since Saturday.Marland Kitchen His v rate today is 104 bpm. Dr. Quentin Ore  plans to admit him for Tikosyn after the ablation if he does not see any good targets to ablate.  F/u in  afib clinic, 11/12/22, as pt did not require cardioversion on last visit as he went back into SR so it was cancelled. However, he went back into afib within 3 days and is uncomfortable with fatigue, shortness of breath and dizziness. He is pending ablation end of March but wants to see if he can resume SR  for a few weeks prior to ablation so he can feel rested prior to going into procedure.  EKG today shows afib at 96 bpm. No missed anticoagulation.   Today, he denies symptoms of palpitations, chest pain, shortness of breath, orthopnea, PND, lower extremity edema, dizziness, presyncope, syncope, or neurologic sequela. The patient is tolerating medications without difficulties and is otherwise without complaint today.   Past Medical History:  Diagnosis Date   AF (atrial fibrillation) (HCC)     Back pain    lower   Basal cell carcinoma 11/01/2019   nasal tip, right ear concha   ED (erectile dysfunction)    ED (erectile dysfunction)    Enlarged prostate    Essential hypertension    HOH (hard of hearing)    Hypogonadism in male    Hypothyroidism    Paroxysmal A-fib ([place])    a. 07/2018 s/p PVI.   Past Surgical History:  Procedure Laterality Date   ATRIAL FIBRILLATION ABLATION N/A 07/27/2018   Procedure: ATRIAL FIBRILLATION ABLATION;  Surgeon: Thompson Grayer, MD;  Location: [facility];  Service: Cardiovascular;  Laterality: N/A;   CARDIOVERSION N/A 01/13/2018   Procedure: CARDIOVERSION;  Surgeon: Nelva Bush, MD;  Location: [facility];  Service: Cardiovascular;  Laterality: N/A;   CARDIOVERSION N/A 01/26/2018   Procedure: CARDIOVERSION;  Surgeon: Minna Merritts, MD;  Location: [facility];  Service: Cardiovascular;  Laterality: N/A;   CARDIOVERSION N/A 03/01/2018   Procedure: CARDIOVERSION;  Surgeon: Minna Merritts, MD;  Location: [facility];  Service: Cardiovascular;  Laterality: N/A;   CARDIOVERSION N/A 05/21/2018   Procedure: CARDIOVERSION;  Surgeon: Wellington Hampshire, MD;  Location: [facility];  Service: Cardiovascular;  Laterality: N/A;   COLONOSCOPY  04/2005   COLONOSCOPY WITH PROPOFOL N/A 03/28/2020   Procedure: COLONOSCOPY WITH PROPOFOL;  Surgeon: Robert Bellow, MD;  Location: [facility];  Service: Endoscopy;  Laterality:  N/A;   INCISION AND DRAINAGE Right 06/26/2021   Procedure: INCISION AND DRAINAGE;  Surgeon: Thornton Park, MD;  Location: [facility];  Service: Orthopedics;  Laterality: Right;   KNEE ARTHROSCOPY Right 06/20/2021   Procedure: RIGHT KNEE DIAGNOSTIC  ARTHROSCOPY;  Surgeon: Thornton Park, MD;  Location: [facility];  Service: Orthopedics;  Laterality: Right;   KNEE ARTHROSCOPY Right 06/26/2021   Procedure: ARTHROSCOPY KNEE;  Surgeon: Thornton Park, MD;  Location: [facility];  Service: Orthopedics;  Laterality: Right;   KNEE ARTHROSCOPY  WITH MEDIAL MENISECTOMY Right 05/30/2021   Procedure: KNEE ARTHROSCOPY WITH MEDIAL AND LATERAL MENISECTOMIES AND CHONDROPLASTY OF THE PATELLA;  Surgeon: Thornton Park, MD;  Location: [place] [place];  Service: Orthopedics;  Laterality: Right;   TONSILLECTOMY     TRIGGER FINGER RELEASE Left 12/03/2016   Procedure: 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     albuterol (VENTOLIN HFA) 108 (90 Base) MCG/ACT inhaler Inhale 1-2 puffs into the lungs every 6 (six) hours as needed for wheezing or shortness of breath.     apixaban (ELIQUIS) 5 MG TABS tablet TAKE (1) TABLET BY MOUTH TWICE DAILY 180 tablet 2   diltiazem (CARDIZEM) 30 MG tablet Take 1 tablet every 4 hours AS NEEDED for AFIB heart rate >110 as long as top BP >100. 30 tablet 1   docusate sodium (COLACE) 100 MG capsule Take 100 mg by mouth as needed.     dutasteride (AVODART) 0.5 MG capsule TAKE ONE (1) CAPSULE EACH DAY. 90 capsule 3   fluorouracil (EFUDEX) 5 % cream Apply to affected areas of the face BID x 4 days and the ears BID x 7 days. 30 g 1   ipratropium (ATROVENT) 0.06 % nasal spray Place into both nostrils.     Ipratropium-Albuterol (COMBIVENT) 20-100 MCG/ACT AERS respimat Inhale 1 puff into the lungs every 6 (six) hours as needed for wheezing or shortness of breath.      levothyroxine (SYNTHROID, LEVOTHROID) 25 MCG tablet Take 1 tablet (25 mcg total) by mouth daily before breakfast. 30 tablet 1   losartan (COZAAR) 100 MG tablet Take 1 tablet (100 mg total) by mouth daily. HOLD WHILE ON HIGHER DOSE OF METOPROLOL 30 tablet 3   metoprolol tartrate (LOPRESSOR) 25 MG tablet Take 1.5 tablets (37.5 mg total) by mouth 2 (two) times  daily. 60 tablet 3   tamsulosin (FLOMAX) 0.4 MG CAPS capsule TAKE (1) CAPSULE BY MOUTH EVERY DAY 90 capsule 0   triamcinolone ointment (KENALOG) 0.1 % Apply to aa's rash BID up to two weeks. Avoid applying to face, groin, and axilla. Use as directed. Long-term use can cause thinning of the skin. 80 g 0   No current facility-administered medications for this encounter.    Allergies  Allergen Reactions   Statins Other (See Comments)    Patient declines any statins   Ciprofloxacin Rash    Social History   Socioeconomic History   Marital status: Married    Spouse name: Not on file   Number of children: Not on file   Years of education: Not on file   Highest education level: Not on file  Occupational History   Not on file  Tobacco Use  Smoking status: Former    Packs/day: 0.50    Types: Cigarettes    Quit date: 09/15/2008    Years since quitting: 14.1   Smokeless tobacco: Never  Vaping Use   Vaping Use: Never used  Substance and Sexual Activity   Alcohol use: Yes    Comment: on the weekend- has a few shots   Drug use: No   Sexual activity: Not on file  Other Topics Concern   Not on file  Social History Narrative   Lives in [place] with spouse   Owns rental property   Social Determinants of Health   Financial Resource Strain: Not on file  Food Insecurity: Not on file  Transportation Needs: Not on file  Physical Activity: Not on file  Stress: Not on file  Social Connections: Not on file  Intimate Partner Violence: Not on file    Family History  Problem Relation Age of Onset   Ovarian cancer Mother    Throat cancer Father    Prostate cancer Neg Hx    Kidney cancer Neg Hx     ROS- All systems are reviewed and negative except as per the HPI above  Physical Exam: There were no vitals filed for this visit.  Wt Readings from Last 3 Encounters:  11/03/22 107.3 kg  10/01/22 105.1 kg  08/27/22 105.7 kg    Labs: Lab Results  Component Value Date   NA 135  10/14/2022   K 4.6 10/14/2022   CL 100 10/14/2022   CO2 21 10/14/2022   GLUCOSE 101 (H) 10/14/2022   BUN 8 10/14/2022   CREATININE 0.91 10/14/2022   CALCIUM 9.0 10/14/2022   Lab Results  Component Value Date   INR 0.97 10/29/2017   No results found for: "CHOL", "HDL", "LDLCALC", "TRIG"   GEN- The patient is well appearing, alert and oriented x 3 today.   Head- normocephalic, atraumatic Eyes-  Sclera clear, conjunctiva pink Ears- hearing intact Oropharynx- clear Neck- supple, no JVP Lymph- no cervical lymphadenopathy Lungs- Clear to ausculation bilaterally, normal work of breathing Heart- irregular rate and rhythm, no murmurs, rubs or gallops, PMI not laterally displaced GI- soft, NT, ND, + BS Extremities- no clubbing, cyanosis, or edema MS- no significant deformity or atrophy Skin- no rash or lesion Psych- euthymic mood, full affect Neuro- strength and sensation are intact  EKG- Vent. rate 96 BPM PR interval * ms QRS duration 84 ms QT/QTcB 354/447 ms P-R-T axes * 2 57 Atrial fibrillation Low voltage QRS Abnormal ECG When compared with ECG of 01-Oct-2022 14:23, PREVIOUS ECG IS PRESENT   Assessment and Plan: 1.Persistent afib Did  very well s/p ablation 2021 without any afib, off amiodarone  after ablation, previous use of flecainide and Rythmol   Recent increase in afib burden, now pending ablation in March but with persistent symptomatic  afib since for several weeks now,  plan is for cardioversion 3/5 and then ablation  March 26th  and if no good targets to ablate, Dr. Quentin Ore will admit afterwards and load on tikosyn Continue  eliquis 5 mg bid  with a chadsvasc score of 2, states no missed doses for at least 21 days  Continue  metoprolol tartrate  25 mg bid  Cbc/bmet today   2. HTN Stable  `  Silvanna Ohmer C. Keshun Berrett, [facility] [address] [place], [place] [address] [phone]

## 2022-11-12 NOTE — Patient Instructions (Signed)
Cardioversion scheduled for: Tuesday, March 5th   - Arrive at the [facility] and go to admitting at 1030am   - Do not eat or drink anything after midnight the night prior to your procedure.   - Take all your morning medication (except diabetic medications) with a sip of water prior to arrival.  - You will not be able to drive home after your procedure.    - Do NOT miss any doses of your blood thinner - if you should miss a dose please notify our office immediately.   - If you feel as if you go back into normal rhythm prior to scheduled cardioversion, please notify our office immediately.   If your procedure is canceled in the cardioversion suite you will be charged a cancellation fee.

## 2022-11-17 ENCOUNTER — Telehealth: Payer: Self-pay

## 2022-11-17 ENCOUNTER — Encounter: Payer: Self-pay | Admitting: Dermatology

## 2022-11-17 ENCOUNTER — Other Ambulatory Visit: Payer: Self-pay

## 2022-11-17 DIAGNOSIS — C4401 Basal cell carcinoma of skin of lip: Secondary | ICD-10-CM

## 2022-11-17 NOTE — Telephone Encounter (Signed)
-----   Message from Alfonso Patten, MD sent at 11/14/2022  4:21 PM EST ----- 1. Skin , left lower cutaneous lip sup BASAL CELL CARCINOMA, NODULAR PATTERN --> Mohs with Dr. Manley Mason at [facility]  2. Skin , left lower cutaneous lip inferior BASAL CELL CARCINOMA, NODULAR PATTERN --> Mohs with Dr. Manley Mason at [facility]  I reviewed results and recommendation with patient's wife (HIPAA contact) as he was not available. They will review results and he will let us know for sure whether he is in agreement with Mohs surgery.  Also discussed instructions for using the 5FU/calcipotriene cream (do not use this to chin and lower lip and he needs at least a month after finishing the cream treatment before Mohs surgery; If Mohs surgery is scheduled sooner, then they will wait until after his Mohs surgery is done and he is completely healed before doing the cream treatment).  MAs, once we receive confirmation from Alexander Carey, we can place referral to Dr. Manley Mason at [facility]. Thank you!

## 2022-11-17 NOTE — Progress Notes (Signed)
Error

## 2022-11-17 NOTE — Telephone Encounter (Signed)
Patient agrees with referral to Dr. Manley Mason at [facility]. Referral sent today. Patient advised referral has been sent.

## 2022-11-18 ENCOUNTER — Ambulatory Visit (HOSPITAL_COMMUNITY)
Admission: RE | Admit: 2022-11-18 | Discharge: 2022-11-18 | Disposition: A | Discharge status: Home / Self Care | Payer: PPO | Attending: Cardiology | Admitting: Cardiology

## 2022-11-18 ENCOUNTER — Encounter (HOSPITAL_COMMUNITY)
Admission: RE | Disposition: A | Discharge status: Home / Self Care | Payer: Self-pay | Source: Home / Self Care | Attending: Cardiology

## 2022-11-18 ENCOUNTER — Ambulatory Visit (HOSPITAL_COMMUNITY): Payer: PPO | Admitting: Certified Registered Nurse Anesthetist

## 2022-11-18 ENCOUNTER — Ambulatory Visit (HOSPITAL_BASED_OUTPATIENT_CLINIC_OR_DEPARTMENT_OTHER): Payer: PPO | Admitting: Certified Registered Nurse Anesthetist

## 2022-11-18 ENCOUNTER — Encounter (HOSPITAL_COMMUNITY): Payer: Self-pay | Admitting: Cardiology

## 2022-11-18 ENCOUNTER — Other Ambulatory Visit: Payer: Self-pay

## 2022-11-18 DIAGNOSIS — I10 Essential (primary) hypertension: Secondary | ICD-10-CM | POA: Insufficient documentation

## 2022-11-18 DIAGNOSIS — Z87891 Personal history of nicotine dependence: Secondary | ICD-10-CM | POA: Insufficient documentation

## 2022-11-18 DIAGNOSIS — I4891 Unspecified atrial fibrillation: Secondary | ICD-10-CM

## 2022-11-18 DIAGNOSIS — I4819 Other persistent atrial fibrillation: Secondary | ICD-10-CM | POA: Diagnosis not present

## 2022-11-18 DIAGNOSIS — E039 Hypothyroidism, unspecified: Secondary | ICD-10-CM | POA: Insufficient documentation

## 2022-11-18 HISTORY — PX: CARDIOVERSION: SHX1299

## 2022-11-18 SURGERY — CARDIOVERSION
Anesthesia: General

## 2022-11-18 MED ORDER — LIDOCAINE 2% (20 MG/ML) 5 ML SYRINGE
INTRAMUSCULAR | Status: DC | PRN
  Administered 2022-11-18: 60 mg via INTRAVENOUS

## 2022-11-18 MED ORDER — SODIUM CHLORIDE 0.9 % IV SOLN: INTRAVENOUS | Status: DC

## 2022-11-18 MED ORDER — PROPOFOL 10 MG/ML IV BOLUS
INTRAVENOUS | Status: DC | PRN
  Administered 2022-11-18: 60 mg via INTRAVENOUS

## 2022-11-18 NOTE — Anesthesia Postprocedure Evaluation (Signed)
Anesthesia Post Note  Patient: Alexander Carey  Procedure(s) Performed: CARDIOVERSION     Patient location during evaluation: Endoscopy Anesthesia Type: General Level of consciousness: awake and sedated Pain management: pain level controlled Vital Signs Assessment: post-procedure vital signs reviewed and stable Respiratory status: spontaneous breathing Cardiovascular status: stable Postop Assessment: no apparent nausea or vomiting Anesthetic complications: no  No notable events documented.  Last Vitals:  Vitals:   11/18/22 1027 11/18/22 1056  BP: (!) 123/93 100/79  Pulse:  71  Resp: 19 17  Temp: (!) 36.3 C (!) 36 C  SpO2: 98% 99%    Last Pain:  Vitals:   11/18/22 1056  TempSrc: Tympanic  PainSc: 0-No pain                 [name]

## 2022-11-18 NOTE — Anesthesia Preprocedure Evaluation (Signed)
Anesthesia Evaluation  Patient identified by MRN, date of birth, ID band Patient awake    Reviewed: Allergy & Precautions, NPO status , Patient's Chart, lab work & pertinent test results, reviewed documented beta blocker date and time   Airway Mallampati: II  TM Distance: >3 FB Neck ROM: Full    Dental no notable dental hx.    Pulmonary former smoker   Pulmonary exam normal        Cardiovascular hypertension, Pt. on medications and Pt. on home beta blockers + dysrhythmias Atrial Fibrillation  Rhythm:Irregular Rate:Normal     Neuro/Psych    GI/Hepatic negative GI ROS, Neg liver ROS,,,  Endo/Other  Hypothyroidism    Renal/GU negative Renal ROS  negative genitourinary   Musculoskeletal   Abdominal Normal abdominal exam  (+)   Peds  Hematology negative hematology ROS (+)   Anesthesia Other Findings   Reproductive/Obstetrics                             Anesthesia Physical Anesthesia Plan  ASA: 2  Anesthesia Plan: General   Post-op Pain Management:    Induction: Intravenous  PONV Risk Score and Plan: 2 and Propofol infusion and TIVA  Airway Management Planned: Natural Airway and Simple Face Mask  Additional Equipment: None  Intra-op Plan:   Post-operative Plan:   Informed Consent: I have reviewed the patients History and Physical, chart, labs and discussed the procedure including the risks, benefits and alternatives for the proposed anesthesia with the patient or authorized representative who has indicated his/her understanding and acceptance.       Plan Discussed with: CRNA  Anesthesia Plan Comments:        Anesthesia Quick Evaluation

## 2022-11-18 NOTE — Discharge Instructions (Signed)

## 2022-11-18 NOTE — Transfer of Care (Signed)
Immediate Anesthesia Transfer of Care Note  Patient: Alexander Carey  Procedure(s) Performed: CARDIOVERSION  Patient Location: Endoscopy Unit  Anesthesia Type:General  Level of Consciousness: awake, alert , and oriented  Airway & Oxygen Therapy: Patient Spontanous Breathing  Post-op Assessment: Report given to RN and Post -op Vital signs reviewed and stable  Post vital signs: Reviewed and stable  Last Vitals:  Vitals Value Taken Time  BP    Temp    Pulse    Resp    SpO2      Last Pain:  Vitals:   11/18/22 1027  TempSrc: Tympanic  PainSc: 0-No pain         Complications: No notable events documented.

## 2022-11-18 NOTE — Procedures (Signed)
Electrical Cardioversion Procedure Note JOSEJAVIER PROPHETE [id number] 01/02/1951  Procedure: Electrical Cardioversion Indications:  Atrial Fibrillation  Procedure Details Consent: Risks of procedure as well as the alternatives and risks of each were explained to the (patient/caregiver).  Consent for procedure obtained. Time Out: Verified patient identification, verified procedure, site/side was marked, verified correct patient position, special equipment/implants available, medications/allergies/relevent history reviewed, required imaging and test results available.  Performed  Patient placed on cardiac monitor, pulse oximetry, supplemental oxygen as necessary.  Sedation given:  Pt sedated by anesthesia with liocaine 60 mg and diprovan 60 mg IV. Pacer pads placed anterior and posterior chest.  Cardioverted 1 time(s).  Cardioverted at [place].  Evaluation Findings: Post procedure EKG shows: NSR Complications: None Patient did tolerate procedure well.   Kirk Ruths 11/18/2022, 10:19 AM

## 2022-11-18 NOTE — Interval H&P Note (Signed)
History and Physical Interval Note:  11/18/2022 10:17 AM  Alexander Carey  has presented today for surgery, with the diagnosis of afib.  The various methods of treatment have been discussed with the patient and family. After consideration of risks, benefits and other options for treatment, the patient has consented to  Procedure(s): CARDIOVERSION (N/A) as a surgical intervention.  The patient's history has been reviewed, patient examined, no change in status, stable for surgery.  I have reviewed the patient's chart and labs.  Questions were answered to the patient's satisfaction.     Kirk Ruths

## 2022-11-18 NOTE — Anesthesia Procedure Notes (Signed)
Procedure Name: General with mask airway Date/Time: 11/18/2022 10:51 AM  Performed by: Valda Favia, CRNAPre-anesthesia Checklist: Patient identified, Emergency Drugs available, Suction available, Patient being monitored and Timeout performed Patient Re-evaluated:Patient Re-evaluated prior to induction Oxygen Delivery Method: Ambu bag Preoxygenation: Pre-oxygenation with 100% oxygen Induction Type: IV induction Ventilation: Mask ventilation without difficulty Placement Confirmation: positive ETCO2 Dental Injury: Teeth and Oropharynx as per pre-operative assessment

## 2022-11-18 NOTE — H&P (Signed)
ATRIAL FIB OFFICE VISIT 11/12/2022 [facility] [place], NP Cardiology Persistent atrial fibrillation [facility]) +1 more Dx Referred by Valera Castle, MD Reason for Visit   Additional Documentation  Vitals: BP 132/90 Important    Pulse 96   Ht 6' (1.829 m)   Wt 107 kg   BMI 32.01 kg/m   BSA 2.33 m      More Vitals  Flowsheets: Anthropometrics,   NEWS,   MEWS Score,   Method of Visit  Encounter Info: Billing Info,   History,   Allergies,   Detailed Report   All Notes   Progress Notes by Sherran Needs, NP at 11/12/2022 10:30 AM  Author: Sherran Needs, NP Author Type: Nurse Practitioner Filed: 11/12/2022 11:26 AM  Note Status: Signed Cosign: Cosign Not Required Date of Service: 11/12/2022 10:30 AM  Editor: Sherran Needs, NP (Nurse Practitioner)             Expand All Collapse All    Primary Care Physician: Valera Castle, MD Referring Physician: Dr. Camille Bal is a 72 y.o. male with a h/o persistent afib with termination pauses, failing flecainide, Rythmol and currently on  Amiodarone. He is in the afib clinic for one month follow up from ablation. He is in SR. Has not noted any afib since the procedure.  No swallowing or groin  issues.   F/u in afib clinic 10/24/21. He is here for one year f/u. He has been off amiodarone since shortly after ablation and is staying in SR. However, he has noted BP's at hone running above 150 sys. It is 160/86 in the clinic today.   F/u in afib clinic 10/01/22. He recently saw Dr. Quentin Ore and is pending an ablation in March. He is now in the afib clinic as he went back into afib on Saturday and missed a dose of his anticoagulation on Sunday. He feels weak and dizzy in afib. He has resumed SR spontaneously in the past after 1-2 days. He has tried an occasional extra 1/2 metoprolol since Saturday.Marland Kitchen His v rate today is 104 bpm. Dr. Quentin Ore   plans to admit him for Tikosyn after the ablation if he does not see any good targets to ablate.   F/u in  afib clinic, 11/12/22, as pt did not require cardioversion on last visit as he went back into SR so it was cancelled. However, he went back into afib within 3 days and is uncomfortable with fatigue, shortness of breath and dizziness. He is pending ablation end of March but wants to see if he can resume SR  for a few weeks prior to ablation so he can feel rested prior to going into procedure.  EKG today shows afib at 96 bpm. No missed anticoagulation.    Today, he denies symptoms of palpitations, chest pain, shortness of breath, orthopnea, PND, lower extremity edema, dizziness, presyncope, syncope, or neurologic sequela. The patient is tolerating medications without difficulties and is otherwise without complaint today.        Past Medical History:  Diagnosis Date   AF (atrial fibrillation) (HCC)     Back pain      lower   Basal cell carcinoma 11/01/2019    nasal tip, right ear concha   ED (erectile dysfunction)     ED (erectile dysfunction)     Enlarged prostate     Essential hypertension  HOH (hard of hearing)     Hypogonadism in male     Hypothyroidism     Paroxysmal A-fib ([place])      a. 07/2018 s/p PVI.         Past Surgical History:  Procedure Laterality Date   ATRIAL FIBRILLATION ABLATION N/A 07/27/2018    Procedure: ATRIAL FIBRILLATION ABLATION;  Surgeon: Thompson Grayer, MD;  Location: [place] [facility];  Service: Cardiovascular;  Laterality: N/A;   CARDIOVERSION N/A 01/13/2018    Procedure: CARDIOVERSION;  Surgeon: Nelva Bush, MD;  Location: [facility];  Service: Cardiovascular;  Laterality: N/A;   CARDIOVERSION N/A 01/26/2018    Procedure: CARDIOVERSION;  Surgeon: Minna Merritts, MD;  Location: [place] [facility];  Service: Cardiovascular;  Laterality: N/A;   CARDIOVERSION N/A 03/01/2018    Procedure: CARDIOVERSION;  Surgeon: Minna Merritts, MD;  Location: [facility] [facility];   Service: Cardiovascular;  Laterality: N/A;   CARDIOVERSION N/A 05/21/2018    Procedure: CARDIOVERSION;  Surgeon: Wellington Hampshire, MD;  Location: [facility];  Service: Cardiovascular;  Laterality: N/A;   COLONOSCOPY   04/2005   COLONOSCOPY WITH PROPOFOL N/A 03/28/2020    Procedure: COLONOSCOPY WITH PROPOFOL;  Surgeon: Robert Bellow, MD;  Location: [facility];  Service: Endoscopy;  Laterality: N/A;   INCISION AND DRAINAGE Right 06/26/2021    Procedure: INCISION AND DRAINAGE;  Surgeon: Thornton Park, MD;  Location: [facility];  Service: Orthopedics;  Laterality: Right;   KNEE ARTHROSCOPY Right 06/20/2021    Procedure: RIGHT KNEE DIAGNOSTIC  ARTHROSCOPY;  Surgeon: Thornton Park, MD;  Location: [facility];  Service: Orthopedics;  Laterality: Right;   KNEE ARTHROSCOPY Right 06/26/2021    Procedure: ARTHROSCOPY KNEE;  Surgeon: Thornton Park, MD;  Location: [facility];  Service: Orthopedics;  Laterality: Right;   KNEE ARTHROSCOPY WITH MEDIAL MENISECTOMY Right 05/30/2021    Procedure: KNEE ARTHROSCOPY WITH MEDIAL AND LATERAL MENISECTOMIES AND CHONDROPLASTY OF THE PATELLA;  Surgeon: Thornton Park, MD;  Location: [place];  Service: Orthopedics;  Laterality: Right;   TONSILLECTOMY       TRIGGER FINGER RELEASE Left 12/03/2016    Procedure: 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       albuterol (VENTOLIN HFA) 108 (90 Base) MCG/ACT inhaler Inhale 1-2 puffs into the lungs every 6 (six) hours as needed for wheezing or shortness of breath.       apixaban (ELIQUIS) 5 MG TABS tablet TAKE (1) TABLET BY MOUTH TWICE DAILY 180 tablet 2   diltiazem (CARDIZEM) 30 MG tablet Take 1  tablet every 4 hours AS NEEDED for AFIB heart rate >110 as long as top BP >100. 30 tablet 1   docusate sodium (COLACE) 100 MG capsule Take 100 mg by mouth as needed.       dutasteride (AVODART) 0.5 MG capsule TAKE ONE (1) CAPSULE EACH DAY. 90 capsule 3   fluorouracil (EFUDEX) 5 % cream Apply to affected areas of the face BID x 4 days and the ears BID x 7 days. 30 g 1   ipratropium (ATROVENT) 0.06 % nasal spray Place into both nostrils.       Ipratropium-Albuterol (COMBIVENT) 20-100 MCG/ACT  AERS respimat Inhale 1 puff into the lungs every 6 (six) hours as needed for wheezing or shortness of breath.        levothyroxine (SYNTHROID, LEVOTHROID) 25 MCG tablet Take 1 tablet (25 mcg total) by mouth daily before breakfast. 30 tablet 1   losartan (COZAAR) 100 MG tablet Take 1 tablet (100 mg total) by mouth daily. HOLD WHILE ON HIGHER DOSE OF METOPROLOL 30 tablet 3   metoprolol tartrate (LOPRESSOR) 25 MG tablet Take 1.5 tablets (37.5 mg total) by mouth 2 (two) times daily. 60 tablet 3   tamsulosin (FLOMAX) 0.4 MG CAPS capsule TAKE (1) CAPSULE BY MOUTH EVERY DAY 90 capsule 0   triamcinolone ointment (KENALOG) 0.1 % Apply to aa's rash BID up to two weeks. Avoid applying to face, groin, and axilla. Use as directed. Long-term use can cause thinning of the skin. 80 g 0    No current facility-administered medications for this encounter.           Allergies  Allergen Reactions   Statins Other (See Comments)      Patient declines any statins   Ciprofloxacin Rash      Social History         Socioeconomic History   Marital status: Married      Spouse name: Not on file   Number of children: Not on file   Years of education: Not on file   Highest education level: Not on file  Occupational History   Not on file  Tobacco Use   Smoking status: Former      Packs/day: 0.50      Types: Cigarettes      Quit date: 09/15/2008      Years since quitting: 14.1   Smokeless tobacco: Never  Vaping Use   Vaping  Use: Never used  Substance and Sexual Activity   Alcohol use: Yes      Comment: on the weekend- has a few shots   Drug use: No   Sexual activity: Not on file  Other Topics Concern   Not on file  Social History Narrative    Lives in [place] with spouse    Owns rental property    Social Determinants of Health    Financial Resource Strain: Not on file  Food Insecurity: Not on file  Transportation Needs: Not on file  Physical Activity: Not on file  Stress: Not on file  Social Connections: Not on file  Intimate Partner Violence: Not on file           Family History  Problem Relation Age of Onset   Ovarian cancer Mother     Throat cancer Father     Prostate cancer Neg Hx     Kidney cancer Neg Hx        ROS- All systems are reviewed and negative except as per the HPI above   Physical Exam: There were no vitals filed for this visit.      Wt Readings from Last 3 Encounters:  11/03/22 107.3 kg  10/01/22 105.1 kg  08/27/22 105.7 kg      Labs: Recent Labs       Lab Results  Component Value Date    NA 135 10/14/2022    K 4.6 10/14/2022    CL 100 10/14/2022    CO2 21 10/14/2022    GLUCOSE 101 (H) 10/14/2022    BUN 8 10/14/2022    CREATININE 0.91 10/14/2022    CALCIUM 9.0 10/14/2022  Recent Labs       Lab Results  Component Value Date    INR 0.97 10/29/2017      Recent Labs  No results found for: "CHOL", "HDL", "LDLCALC", "TRIG"       GEN- The patient is well appearing, alert and oriented x 3 today.   Head- normocephalic, atraumatic Eyes-  Sclera clear, conjunctiva pink Ears- hearing intact Oropharynx- clear Neck- supple, no JVP Lymph- no cervical lymphadenopathy Lungs- Clear to ausculation bilaterally, normal work of breathing Heart- irregular rate and rhythm, no murmurs, rubs or gallops, PMI not laterally displaced GI- soft, NT, ND, + BS Extremities- no clubbing, cyanosis, or edema MS- no significant deformity or atrophy Skin- no rash or  lesion Psych- euthymic mood, full affect Neuro- strength and sensation are intact   EKG- Vent. rate 96 BPM PR interval * ms QRS duration 84 ms QT/QTcB 354/447 ms P-R-T axes * 2 57 Atrial fibrillation Low voltage QRS Abnormal ECG When compared with ECG of 01-Oct-2022 14:23, PREVIOUS ECG IS PRESENT     Assessment and Plan: 1.Persistent afib Did  very well s/p ablation 2021 without any afib, off amiodarone  after ablation, previous use of flecainide and Rythmol   Recent increase in afib burden, now pending ablation in March but with persistent symptomatic  afib since for several weeks now,  plan is for cardioversion 3/5 and then ablation  March 26th  and if no good targets to ablate, Dr. Quentin Ore will admit afterwards and load on tikosyn Continue  eliquis 5 mg bid  with a chadsvasc score of 2, states no missed doses for at least 21 days  Continue  metoprolol tartrate  25 mg bid  Cbc/bmet today    2. HTN Stable  `   Donna C. Mila [facility] [address] [place], [place] [address] [phone]       For [facility]; no changes; compliant with apixaban. Kirk Ruths

## 2022-11-20 ENCOUNTER — Encounter (HOSPITAL_COMMUNITY): Payer: Self-pay | Admitting: Cardiology

## 2022-11-21 ENCOUNTER — Telehealth (HOSPITAL_COMMUNITY): Payer: Self-pay | Admitting: *Deleted

## 2022-11-21 MED ORDER — METOPROLOL TARTRATE 25 MG PO TABS: 25.0000 mg | ORAL_TABLET | Freq: Two times a day (BID) | ORAL | Status: DC

## 2022-11-21 NOTE — Telephone Encounter (Signed)
Pt wife called in stating pt went back into afib this morning. She reports that the evening after cardioversion he developed shortness of breath so they contacted his PCP who stated fluid in lungs and start lasix and prednisone. Shortness of breath has subsided but unsure of what they should do for ERAF. Pt is scheduled for afib ablation end of March. Pt will resume metoprolol '25mg'$  BID and use PRN cardizem for elevated rates. Pt and wife in agreement.

## 2022-11-24 ENCOUNTER — Telehealth (HOSPITAL_COMMUNITY): Payer: Self-pay | Admitting: *Deleted

## 2022-11-24 MED ORDER — METOPROLOL TARTRATE 25 MG PO TABS: 37.5000 mg | ORAL_TABLET | Freq: Two times a day (BID) | ORAL | Status: DC

## 2022-11-24 NOTE — Telephone Encounter (Signed)
Pt wife called stating over weekend his heart rates have been 140-180s pretty much consistently. Feeling very tired. No shortness of breath. BP 139/80. Has been using PRN cardizem intermittently. Discussed with Adline Peals PA will increase metoprolol to 37.'5mg'$  twice a day use PRN cardizem as BP will allow. ER precautions reviewed with pt and wife.

## 2022-12-01 ENCOUNTER — Encounter: Payer: Self-pay | Admitting: Dermatology

## 2022-12-01 ENCOUNTER — Telehealth (HOSPITAL_COMMUNITY): Payer: Self-pay | Admitting: Emergency Medicine

## 2022-12-01 NOTE — Telephone Encounter (Signed)
Reaching out to patient to offer assistance regarding upcoming cardiac imaging study; pt verbalizes understanding of appt date/time, parking situation and where to check in, pre-test NPO status and medications ordered, and verified current allergies; name and call back number provided for further questions should they arise Alexander Bond RN Navigator Cardiac [facility] [place] [facility] [phone] office [phone] cell   [place] iv issues Aware contrast

## 2022-12-02 ENCOUNTER — Encounter: Payer: Self-pay | Admitting: Cardiology

## 2022-12-02 ENCOUNTER — Ambulatory Visit (HOSPITAL_BASED_OUTPATIENT_CLINIC_OR_DEPARTMENT_OTHER)
Admission: RE | Admit: 2022-12-02 | Discharge: 2022-12-02 | Disposition: A | Discharge status: Home / Self Care | Payer: PPO | Source: Ambulatory Visit | Attending: Cardiovascular Disease | Admitting: Cardiovascular Disease

## 2022-12-02 ENCOUNTER — Encounter (HOSPITAL_BASED_OUTPATIENT_CLINIC_OR_DEPARTMENT_OTHER): Payer: Self-pay

## 2022-12-02 DIAGNOSIS — I10 Essential (primary) hypertension: Secondary | ICD-10-CM | POA: Diagnosis present

## 2022-12-02 DIAGNOSIS — I4819 Other persistent atrial fibrillation: Secondary | ICD-10-CM | POA: Insufficient documentation

## 2022-12-02 DIAGNOSIS — R0789 Other chest pain: Secondary | ICD-10-CM | POA: Insufficient documentation

## 2022-12-02 DIAGNOSIS — R072 Precordial pain: Secondary | ICD-10-CM | POA: Insufficient documentation

## 2022-12-02 MED ORDER — IOHEXOL 350 MG/ML SOLN
100.0000 mL | Freq: Once | INTRAVENOUS | Status: AC | PRN
  Administered 2022-12-02: 80 mL via INTRAVENOUS

## 2022-12-08 NOTE — Pre-Procedure Instructions (Signed)
Instructed patient on the following items: Arrival time 0515 Nothing to eat or drink after midnight No meds AM of procedure Responsible person to drive you home and stay with you for 24 hrs  Have you missed any doses of anti-coagulant Eliquis- taken twice daily, hasn't missed any doses, don't take dose in the morning.

## 2022-12-08 NOTE — Anesthesia Preprocedure Evaluation (Signed)
Anesthesia Evaluation  Patient identified by MRN, date of birth, ID band Patient awake    Reviewed: Allergy & Precautions, NPO status , Patient's Chart, lab work & pertinent test results, reviewed documented beta blocker date and time   History of Anesthesia Complications Negative for: history of anesthetic complications  Airway Mallampati: I  TM Distance: >3 FB Neck ROM: Full    Dental  (+) Dental Advisory Given, Teeth Intact   Pulmonary sleep apnea (does not use CPAP) , COPD,  COPD inhaler, former smoker   breath sounds clear to auscultation       Cardiovascular hypertension, Pt. on medications and Pt. on home beta blockers (-) angina + dysrhythmias Atrial Fibrillation  Rhythm:Irregular Rate:Normal  '23 Stress:  The study is normal. The study is low risk.   No ST deviation was noted.   LV perfusion is normal. There is no evidence of ischemia, is no evidence of infarction.   Left ventricular function is normal.    Neuro/Psych negative neurological ROS     GI/Hepatic negative GI ROS, Neg liver ROS,,,  Endo/Other  Hypothyroidism  BMI 30.5  Renal/GU negative Renal ROS     Musculoskeletal  (+) Arthritis ,    Abdominal  (+) + obese  Peds  Hematology eliquis   Anesthesia Other Findings   Reproductive/Obstetrics                              Anesthesia Physical Anesthesia Plan  ASA: 3  Anesthesia Plan: General   Post-op Pain Management: Tylenol PO (pre-op)*   Induction: Intravenous  PONV Risk Score and Plan: 2 and Ondansetron and Dexamethasone  Airway Management Planned: Oral ETT  Additional Equipment: None  Intra-op Plan:   Post-operative Plan: Extubation in OR  Informed Consent: I have reviewed the patients History and Physical, chart, labs and discussed the procedure including the risks, benefits and alternatives for the proposed anesthesia with the patient or authorized  representative who has indicated his/her understanding and acceptance.     Dental advisory given  Plan Discussed with: CRNA and Surgeon  Anesthesia Plan Comments:          Anesthesia Quick Evaluation

## 2022-12-09 ENCOUNTER — Ambulatory Visit (HOSPITAL_COMMUNITY)
Admission: RE | Admit: 2022-12-09 | Discharge: 2022-12-10 | Disposition: A | Discharge status: Home / Self Care | Payer: PPO | Source: Ambulatory Visit | Attending: Cardiology | Admitting: Cardiology

## 2022-12-09 ENCOUNTER — Other Ambulatory Visit: Payer: Self-pay

## 2022-12-09 ENCOUNTER — Encounter (HOSPITAL_COMMUNITY)
Admission: RE | Disposition: A | Discharge status: Home / Self Care | Payer: PPO | Source: Ambulatory Visit | Attending: Cardiology

## 2022-12-09 ENCOUNTER — Ambulatory Visit (HOSPITAL_BASED_OUTPATIENT_CLINIC_OR_DEPARTMENT_OTHER): Payer: PPO | Admitting: Anesthesiology

## 2022-12-09 ENCOUNTER — Ambulatory Visit (HOSPITAL_COMMUNITY): Payer: PPO | Admitting: Anesthesiology

## 2022-12-09 ENCOUNTER — Other Ambulatory Visit (HOSPITAL_COMMUNITY): Payer: Self-pay

## 2022-12-09 DIAGNOSIS — Z7901 Long term (current) use of anticoagulants: Secondary | ICD-10-CM | POA: Insufficient documentation

## 2022-12-09 DIAGNOSIS — Z87891 Personal history of nicotine dependence: Secondary | ICD-10-CM

## 2022-12-09 DIAGNOSIS — J449 Chronic obstructive pulmonary disease, unspecified: Secondary | ICD-10-CM

## 2022-12-09 DIAGNOSIS — I4891 Unspecified atrial fibrillation: Secondary | ICD-10-CM | POA: Diagnosis not present

## 2022-12-09 DIAGNOSIS — I10 Essential (primary) hypertension: Secondary | ICD-10-CM

## 2022-12-09 DIAGNOSIS — I4819 Other persistent atrial fibrillation: Secondary | ICD-10-CM

## 2022-12-09 HISTORY — PX: ATRIAL FIBRILLATION ABLATION: EP1191

## 2022-12-09 LAB — POCT ACTIVATED CLOTTING TIME
Activated Clotting Time: 309 seconds
Activated Clotting Time: 315 seconds

## 2022-12-09 SURGERY — ATRIAL FIBRILLATION ABLATION
Anesthesia: General

## 2022-12-09 MED ORDER — ACETAMINOPHEN 500 MG PO TABS: 1000.0000 mg | ORAL_TABLET | Freq: Four times a day (QID) | ORAL | Status: DC | PRN

## 2022-12-09 MED ORDER — ONDANSETRON HCL 4 MG/2ML IJ SOLN: 4.0000 mg | Freq: Four times a day (QID) | INTRAMUSCULAR | Status: DC | PRN

## 2022-12-09 MED ORDER — HEPARIN SODIUM (PORCINE) 1000 UNIT/ML IJ SOLN
INTRAMUSCULAR | Status: AC
  Filled 2022-12-09: qty 10

## 2022-12-09 MED ORDER — PHENYLEPHRINE HCL-NACL 20-0.9 MG/250ML-% IV SOLN
25.0000 ug/min | INTRAVENOUS | Status: DC
  Administered 2022-12-09: 25 ug/min via INTRAVENOUS

## 2022-12-09 MED ORDER — PROPOFOL 10 MG/ML IV BOLUS
INTRAVENOUS | Status: DC | PRN
  Administered 2022-12-09: 120 mg via INTRAVENOUS
  Administered 2022-12-09: 40 mg via INTRAVENOUS

## 2022-12-09 MED ORDER — DEXAMETHASONE SODIUM PHOSPHATE 10 MG/ML IJ SOLN
INTRAMUSCULAR | Status: DC | PRN
  Administered 2022-12-09: 5 mg via INTRAVENOUS

## 2022-12-09 MED ORDER — SODIUM CHLORIDE 0.9% FLUSH: 3.0000 mL | INTRAVENOUS | Status: DC | PRN

## 2022-12-09 MED ORDER — PANTOPRAZOLE SODIUM 40 MG PO TBEC
40.0000 mg | DELAYED_RELEASE_TABLET | Freq: Every day | ORAL | Status: DC
  Administered 2022-12-09 – 2022-12-10 (×2): 40 mg via ORAL
  Filled 2022-12-09 (×2): qty 1

## 2022-12-09 MED ORDER — LEVOTHYROXINE SODIUM 25 MCG PO TABS
25.0000 ug | ORAL_TABLET | Freq: Every day | ORAL | Status: DC
  Administered 2022-12-10: 25 ug via ORAL
  Filled 2022-12-09: qty 1

## 2022-12-09 MED ORDER — IPRATROPIUM-ALBUTEROL 0.5-2.5 (3) MG/3ML IN SOLN: 3.0000 mL | Freq: Four times a day (QID) | RESPIRATORY_TRACT | Status: DC | PRN

## 2022-12-09 MED ORDER — HEPARIN (PORCINE) IN NACL 1000-0.9 UT/500ML-% IV SOLN
INTRAVENOUS | Status: DC | PRN
  Administered 2022-12-09 (×3): 500 mL

## 2022-12-09 MED ORDER — HEPARIN SODIUM (PORCINE) 1000 UNIT/ML IJ SOLN
INTRAMUSCULAR | Status: DC | PRN
  Administered 2022-12-09: 1000 [IU] via INTRAVENOUS

## 2022-12-09 MED ORDER — ACETAMINOPHEN ER 650 MG PO TBCR: 1300.0000 mg | EXTENDED_RELEASE_TABLET | Freq: Three times a day (TID) | ORAL | Status: DC | PRN

## 2022-12-09 MED ORDER — EPHEDRINE SULFATE (PRESSORS) 50 MG/ML IJ SOLN
INTRAMUSCULAR | Status: DC | PRN
  Administered 2022-12-09 (×4): 5 mg via INTRAVENOUS

## 2022-12-09 MED ORDER — SODIUM CHLORIDE 0.9 % IV SOLN: 250.0000 mL | INTRAVENOUS | Status: DC | PRN

## 2022-12-09 MED ORDER — PANTOPRAZOLE SODIUM 40 MG PO TBEC
40.0000 mg | DELAYED_RELEASE_TABLET | Freq: Every day | ORAL | 0 refills | Status: DC
  Filled 2022-12-09: qty 30, 30d supply, fill #0

## 2022-12-09 MED ORDER — IPRATROPIUM-ALBUTEROL 20-100 MCG/ACT IN AERS: 1.0000 | INHALATION_SPRAY | Freq: Four times a day (QID) | RESPIRATORY_TRACT | Status: DC | PRN

## 2022-12-09 MED ORDER — SODIUM CHLORIDE 0.9 % IV SOLN: INTRAVENOUS | Status: DC

## 2022-12-09 MED ORDER — SODIUM CHLORIDE 0.9% FLUSH
3.0000 mL | Freq: Two times a day (BID) | INTRAVENOUS | Status: DC
  Administered 2022-12-09 (×2): 3 mL via INTRAVENOUS

## 2022-12-09 MED ORDER — ROCURONIUM BROMIDE 10 MG/ML (PF) SYRINGE
PREFILLED_SYRINGE | INTRAVENOUS | Status: DC | PRN
  Administered 2022-12-09: 60 mg via INTRAVENOUS

## 2022-12-09 MED ORDER — SUGAMMADEX SODIUM 200 MG/2ML IV SOLN
INTRAVENOUS | Status: DC | PRN
  Administered 2022-12-09: 200 mg via INTRAVENOUS

## 2022-12-09 MED ORDER — COLCHICINE 0.6 MG PO TABS
0.6000 mg | ORAL_TABLET | Freq: Two times a day (BID) | ORAL | Status: DC
  Administered 2022-12-09 – 2022-12-10 (×3): 0.6 mg via ORAL
  Filled 2022-12-09 (×3): qty 1

## 2022-12-09 MED ORDER — COLCHICINE 0.6 MG PO TABS
0.6000 mg | ORAL_TABLET | Freq: Two times a day (BID) | ORAL | 0 refills | Status: DC
  Filled 2022-12-09: qty 10, 5d supply, fill #0

## 2022-12-09 MED ORDER — HEPARIN SODIUM (PORCINE) 1000 UNIT/ML IJ SOLN
INTRAMUSCULAR | Status: DC | PRN
  Administered 2022-12-09: 16000 [IU] via INTRAVENOUS
  Administered 2022-12-09: 3000 [IU] via INTRAVENOUS
  Administered 2022-12-09: 4000 [IU] via INTRAVENOUS

## 2022-12-09 MED ORDER — MELATONIN 3 MG PO TABS
3.0000 mg | ORAL_TABLET | Freq: Every day | ORAL | Status: DC
  Administered 2022-12-09: 3 mg via ORAL
  Filled 2022-12-09: qty 1

## 2022-12-09 MED ORDER — ONDANSETRON HCL 4 MG/2ML IJ SOLN
INTRAMUSCULAR | Status: DC | PRN
  Administered 2022-12-09: 4 mg via INTRAVENOUS

## 2022-12-09 MED ORDER — ACETAMINOPHEN 325 MG PO TABS: 650.0000 mg | ORAL_TABLET | ORAL | Status: DC | PRN

## 2022-12-09 MED ORDER — SODIUM CHLORIDE 0.9 % IV BOLUS
500.0000 mL | Freq: Once | INTRAVENOUS | Status: AC
  Administered 2022-12-09: 500 mL via INTRAVENOUS

## 2022-12-09 MED ORDER — ALBUTEROL SULFATE (2.5 MG/3ML) 0.083% IN NEBU: 2.5000 mg | INHALATION_SOLUTION | Freq: Four times a day (QID) | RESPIRATORY_TRACT | Status: DC | PRN

## 2022-12-09 MED ORDER — PHENYLEPHRINE HCL (PRESSORS) 10 MG/ML IV SOLN: 25.0000 ug/kg | INTRAVENOUS | Status: DC | PRN

## 2022-12-09 MED ORDER — APIXABAN 5 MG PO TABS
5.0000 mg | ORAL_TABLET | Freq: Two times a day (BID) | ORAL | Status: DC
  Administered 2022-12-09 – 2022-12-10 (×3): 5 mg via ORAL
  Filled 2022-12-09 (×3): qty 1

## 2022-12-09 MED ORDER — LIDOCAINE 2% (20 MG/ML) 5 ML SYRINGE
INTRAMUSCULAR | Status: DC | PRN
  Administered 2022-12-09: 20 mg via INTRAVENOUS

## 2022-12-09 MED ORDER — PROTAMINE SULFATE 10 MG/ML IV SOLN
INTRAVENOUS | Status: DC | PRN
  Administered 2022-12-09: 15 mg via INTRAVENOUS

## 2022-12-09 MED ORDER — ALBUTEROL SULFATE HFA 108 (90 BASE) MCG/ACT IN AERS: 1.0000 | INHALATION_SPRAY | Freq: Four times a day (QID) | RESPIRATORY_TRACT | Status: DC | PRN

## 2022-12-09 MED ORDER — PHENYLEPHRINE HCL-NACL 20-0.9 MG/250ML-% IV SOLN
INTRAVENOUS | Status: DC | PRN
  Administered 2022-12-09: 40 ug/min via INTRAVENOUS

## 2022-12-09 MED ORDER — DOCUSATE SODIUM 100 MG PO CAPS: 100.0000 mg | ORAL_CAPSULE | Freq: Every day | ORAL | Status: DC | PRN

## 2022-12-09 MED ORDER — FENTANYL CITRATE (PF) 250 MCG/5ML IJ SOLN
INTRAMUSCULAR | Status: DC | PRN
  Administered 2022-12-09: 100 ug via INTRAVENOUS

## 2022-12-09 MED ORDER — PHENYLEPHRINE HCL (PRESSORS) 10 MG/ML IV SOLN
INTRAVENOUS | Status: DC | PRN
  Administered 2022-12-09: 160 ug via INTRAVENOUS
  Administered 2022-12-09: 120 ug via INTRAVENOUS
  Administered 2022-12-09 (×2): 160 ug via INTRAVENOUS

## 2022-12-09 MED ORDER — ACETAMINOPHEN 500 MG PO TABS
1000.0000 mg | ORAL_TABLET | Freq: Once | ORAL | Status: AC
  Administered 2022-12-09: 1000 mg via ORAL
  Filled 2022-12-09: qty 2

## 2022-12-09 SURGICAL SUPPLY — 19 items
BAG SNAP BAND KOVER 36X36 (MISCELLANEOUS) IMPLANT
CATH ABLAT QDOT MICRO BI TC DF (CATHETERS) IMPLANT
CATH OCTARAY 2.0 F 3-3-3-3-3 (CATHETERS) IMPLANT
CATH S-M CIRCA TEMP PROBE (CATHETERS) IMPLANT
CATH SOUNDSTAR ECO 8FR (CATHETERS) IMPLANT
CATH WEBSTER BI DIR CS D-F CRV (CATHETERS) IMPLANT
CLOSURE PERCLOSE PROSTYLE (VASCULAR PRODUCTS) IMPLANT
COVER SWIFTLINK CONNECTOR (BAG) ×1 IMPLANT
MAT PREVALON FULL STRYKER (MISCELLANEOUS) IMPLANT
PACK EP LATEX FREE (CUSTOM PROCEDURE TRAY) ×1
PACK EP LF (CUSTOM PROCEDURE TRAY) ×1 IMPLANT
PAD DEFIB RADIO PHYSIO CONN (PAD) ×1 IMPLANT
PATCH CARTO3 (PAD) IMPLANT
SHEATH BAYLIS TRANSSEPTAL 98CM (NEEDLE) IMPLANT
SHEATH CARTO VIZIGO SM CVD (SHEATH) IMPLANT
SHEATH PINNACLE 8F 10CM (SHEATH) IMPLANT
SHEATH PINNACLE 9F 10CM (SHEATH) IMPLANT
SHEATH PROBE COVER 6X72 (BAG) IMPLANT
TUBING SMART ABLATE COOLFLOW (TUBING) IMPLANT

## 2022-12-09 NOTE — H&P (Signed)
Electrophysiology Office Follow up Visit Note:     Date:  12/09/2022    ID:  Alexander Carey, DOB [birth year], MRN [id number]   PCP:  Valera Castle, MD     [facility] Cardiologist:  Virl Axe, MD  [facility] Electrophysiologist:  Vickie Epley, MD      Interval History:     Alexander Carey is a 72 y.o. male who presents for a follow up visit.  The patient was previously followed by Dr. Rayann Heman and underwent an A-fib ablation July 27, 2018 with Dr. Rayann Heman.  The patient was seen by the [facility] clinic cardiology team in October 2023.  At his visit with Clabe Seal, he reported several episodes of breakthrough atrial fibrillation.  He is maintained on Eliquis for stroke prophylaxis   A heart monitor was ordered for 7 days in October which showed no evidence of atrial fibrillation.  There were some PACs.   He has atrial fibrillation he feels very poorly.  He is dizzy and fatigued.  His most recent episode was November 23 and lasted 26 to 27 hours.  They are occurring at least every 2 weeks.  He also had some chest pressure during the most recent episode.   Presents for redo PVI today. Procedure reviewed.    Objective      Past Medical History:  Diagnosis Date   AF (atrial fibrillation) (HCC)     Back pain      lower   Basal cell carcinoma 11/01/2019    nasal tip, right ear concha   ED (erectile dysfunction)     ED (erectile dysfunction)     Enlarged prostate     Essential hypertension     HOH (hard of hearing)     Hypogonadism in male     Hypothyroidism     Paroxysmal A-fib ([place])      a. 07/2018 s/p PVI.           Past Surgical History:  Procedure Laterality Date   ATRIAL FIBRILLATION ABLATION N/A 07/27/2018    Procedure: ATRIAL FIBRILLATION ABLATION;  Surgeon: Thompson Grayer, MD;  Location: [place] [facility];  Service: Cardiovascular;  Laterality: N/A;   CARDIOVERSION N/A 01/13/2018    Procedure: CARDIOVERSION;  Surgeon: Nelva Bush, MD;   Location: [facility] OR[facility];  Service: Cardiovascular;  Laterality: N/A;   CARDIOVERSION N/A 01/26/2018    Procedure: CARDIOVERSION;  Surgeon: Minna Merritts, MD;  Location: [place] [facility];  Service: Cardiovascular;  Laterality: N/A;   CARDIOVERSION N/A 03/01/2018    Procedure: CARDIOVERSION;  Surgeon: Minna Merritts, MD;  Location: [place] [facility];  Service: Cardiovascular;  Laterality: N/A;   CARDIOVERSION N/A 05/21/2018    Procedure: CARDIOVERSION;  Surgeon: Wellington Hampshire, MD;  Location: [facility];  Service: Cardiovascular;  Laterality: N/A;   COLONOSCOPY   04/2005   COLONOSCOPY WITH PROPOFOL N/A 03/28/2020    Procedure: COLONOSCOPY WITH PROPOFOL;  Surgeon: Robert Bellow, MD;  Location: [facility];  Service: Endoscopy;  Laterality: N/A;   INCISION AND DRAINAGE Right 06/26/2021    Procedure: INCISION AND DRAINAGE;  Surgeon: Thornton Park, MD;  Location: [facility];  Service: Orthopedics;  Laterality: Right;   KNEE ARTHROSCOPY Right 06/20/2021    Procedure: RIGHT KNEE DIAGNOSTIC  ARTHROSCOPY;  Surgeon: Thornton Park, MD;  Location: [facility];  Service: Orthopedics;  Laterality: Right;   KNEE ARTHROSCOPY Right 06/26/2021    Procedure: ARTHROSCOPY KNEE;  Surgeon: Thornton Park, MD;  Location: [facility];  Service: Orthopedics;  Laterality: Right;   KNEE ARTHROSCOPY WITH MEDIAL MENISECTOMY Right 05/30/2021    Procedure: KNEE ARTHROSCOPY WITH MEDIAL AND LATERAL MENISECTOMIES AND CHONDROPLASTY OF THE PATELLA;  Surgeon: Thornton Park, MD;  Location: [place];  Service: Orthopedics;  Laterality: Right;   TONSILLECTOMY       TRIGGER FINGER RELEASE Left 12/03/2016    Procedure: 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   albuterol (VENTOLIN HFA) 108 (90 Base) MCG/ACT inhaler Inhale 1-2 puffs into the lungs every 6 (six) hours as needed for wheezing or shortness of breath.   apixaban (ELIQUIS) 5 MG TABS tablet TAKE (1) TABLET BY MOUTH TWICE DAILY   dutasteride (AVODART) 0.5 MG capsule TAKE ONE (1) CAPSULE EACH DAY.   Ipratropium-Albuterol (COMBIVENT) 20-100 MCG/ACT AERS respimat Inhale 1 puff into the lungs every 6 (six) hours as needed for wheezing or shortness of breath.    levothyroxine (SYNTHROID, LEVOTHROID) 25 MCG tablet Take 1 tablet (25 mcg total) by mouth daily before breakfast.   losartan (COZAAR) 100 MG tablet Take 1 tablet (100 mg total) by mouth daily.   Meth-Hyo-M Bl-Na Phos-Ph Sal (URIBEL) 118 MG CAPS Take 1 capsule by mouth every morning.   metoprolol tartrate (LOPRESSOR) 25 MG tablet TAKE (1/2) TABLET BY MOUTH TWICE DAILY   tamsulosin (FLOMAX) 0.4 MG CAPS capsule TAKE (1) CAPSULE BY MOUTH EVERY DAY   triamcinolone ointment (KENALOG) 0.1 % Apply to aa's rash BID up to two weeks. Avoid applying to face, groin, and axilla. Use as directed. Long-term use can cause thinning of the skin.        Allergies:   Statins and Ciprofloxacin    Social History         Socioeconomic History   Marital status: Married      Spouse name: Not on file   Number of children: Not on file   Years of education: Not on file   Highest education level: Not on file  Occupational History   Not on file  Tobacco Use   Smoking status: Former      Packs/day: 0.50      Types: Cigarettes      Quit date: 09/15/2008      Years since quitting: 13.9   Smokeless tobacco: Never  Vaping Use   Vaping Use: Never used  Substance and Sexual Activity   Alcohol use: Yes      Comment: on the weekend- has a few shots   Drug use: No   Sexual activity: Not on file  Other Topics Concern   Not on file  Social History Narrative    Lives in [place] with spouse    Owns rental property     Social Determinants of Health    Financial Resource Strain: Not on file  Food Insecurity: Not on file  Transportation Needs: Not on file  Physical Activity: Not on file  Stress: Not on file  Social Connections: Not on file      Family History: The patient's family history includes Ovarian cancer in his mother; Throat cancer in his father. There is no history of Prostate cancer or Kidney cancer.   ROS:  Please see the history of present illness.    All other systems reviewed and are negative.   EKGs/Labs/Other Studies Reviewed:     The following studies were reviewed today:   2019 A-fib ablation-veins and posterior wall isolation   EKG:  The ekg ordered today demonstrates sinus rhythm.  QTc 402 ms.   Recent Labs: 11/07/2021: BUN 11; Creatinine, Ser 0.81; Potassium 4.5; Sodium 134  Recent Lipid Panel Labs (Brief)  No results found for: "CHOL", "TRIG", "HDL", "CHOLHDL", "VLDL", "LDLCALC", "LDLDIRECT"     Physical Exam:     VS:  BP 120/87   Pulse 100   Ht 6' (1.829 m)   Wt 233 lb (105.7 kg)   SpO2 98%   BMI 31.60 kg/m         Wt Readings from Last 3 Encounters:  08/27/22 233 lb (105.7 kg)  10/29/21 215 lb (97.5 kg)  10/24/21 224 lb 3.2 oz (101.7 kg)      GEN:  Well nourished, well developed in no acute distress HEENT: Normal NECK: No JVD; No carotid bruits LYMPHATICS: No lymphadenopathy CARDIAC: RRR, no murmurs, rubs, gallops RESPIRATORY:  Clear to auscultation without rales, wheezing or rhonchi  ABDOMEN: Soft, non-tender, non-distended MUSCULOSKELETAL:  No edema; No deformity  SKIN: Warm and dry NEUROLOGIC:  Alert and oriented x 3 PSYCHIATRIC:  Normal affect            Assessment ASSESSMENT:     1. Persistent atrial fibrillation ([place])   2. Primary hypertension   3. Chest pressure     PLAN:     In order of problems listed above:   #Persistent atrial fibrillation On Eliquis for stroke prophylaxis I discussed treatment options with the patient  including antiarrhythmic drugs (amiodarone and Tikosyn) versus catheter ablation.  The patient is very clear that he would like to proceed with catheter ablation if he is a candidate.  We discussed the possibility that during a redo ablation would be no significant targets left from the prior ablation attempt.  If this is the case, would favor keeping him in the hospital the day of the procedure for Tikosyn loading.  I told him that this would be a procedure day decision based on what the initial electroanatomic map shows.  They are willing to proceed with that if necessary.  We will get him scheduled for a catheter ablation.  He will need a CT scan prior to the procedure.  He will also need a stress test done prior to the procedure given the chest discomfort experienced during the most recent episode of atrial fibrillation.  Will plan for nuclear stress.   Discussed treatment options today for their AF including antiarrhythmic drug therapy and ablation. Discussed risks, recovery and likelihood of success. Discussed potential need for repeat ablation procedures and antiarrhythmic drugs after an initial ablation. They wish to proceed with scheduling.   Risk, benefits, and alternatives to EP study and radiofrequency ablation for afib were also discussed in detail today. These risks include but are not limited to stroke, bleeding, vascular damage, tamponade, perforation, damage to the esophagus, lungs, and other structures, pulmonary vein stenosis, worsening renal function, and death. The patient understands these risk and wishes to proceed.  We will therefore proceed with catheter ablation at the next available time.  Carto, ICE, anesthesia are requested for the procedure.  Will also obtain CT PV protocol prior to the procedure to exclude LAA thrombus and further evaluate atrial anatomy.    Presents for PVI today.  Procedure reviewed.      Signed, Lars Mage, MD, [facility],  [facility] 12/09/2022 Electrophysiology [facility]

## 2022-12-09 NOTE — Progress Notes (Signed)
  Paged with concerns over hypotension and ongoing fluid resuscitation.   Discussed with Dr. Quentin Ore and pt had hypotensive reaction to protamine.   Continue to wean neosynephrine. Currently on low dose and BPs have improved to 100/70 range.   Has received total of 1500 cc of fluid, would defer further for now.   Will observe overnight, and made pts wife aware of same.   Legrand Como [address]" [place], PA-C  12/09/2022 11:39 AM

## 2022-12-09 NOTE — Anesthesia Postprocedure Evaluation (Signed)
Anesthesia Post Note  Patient: Alexander Carey  Procedure(s) Performed: ATRIAL FIBRILLATION ABLATION     Patient location during evaluation: PACU Anesthesia Type: General Level of consciousness: awake and alert, patient cooperative and oriented Pain management: pain level controlled Vital Signs Assessment: post-procedure vital signs reviewed and stable Respiratory status: spontaneous breathing, nonlabored ventilation, respiratory function stable and patient connected to nasal cannula oxygen Cardiovascular status: blood pressure returned to baseline and stable Postop Assessment: no apparent nausea or vomiting Anesthetic complications: no   There were no known notable events for this encounter.  Last Vitals:  Vitals:   12/09/22 1318 12/09/22 1346  BP:  126/84  Pulse:  79  Resp:  16  Temp:  36.5 C  SpO2: 95% 96%    Last Pain:  Vitals:   12/09/22 1346  TempSrc: Oral  PainSc:                  Vi Whitesel,E. Ellie Bryand

## 2022-12-09 NOTE — Transfer of Care (Signed)
Immediate Anesthesia Transfer of Care Note  Patient: Alexander Carey  Procedure(s) Performed: ATRIAL FIBRILLATION ABLATION  Patient Location: Cath Lab  Anesthesia Type:General  Level of Consciousness: awake, alert , patient cooperative, and responds to stimulation  Airway & Oxygen Therapy: Patient Spontanous Breathing and Patient connected to nasal cannula oxygen  Post-op Assessment: Report given to RN and Post -op Vital signs reviewed and stable  Post vital signs: Reviewed and stable  Last Vitals:  Vitals Value Taken Time  BP 116/79 12/09/22 1010  Temp 36.5 C 12/09/22 1008  Pulse 79 12/09/22 1010  Resp 14 12/09/22 1010  SpO2 93 % 12/09/22 1010  Vitals shown include unvalidated device data.  Last Pain:  Vitals:   12/09/22 1008  TempSrc:   PainSc: 0-No pain         Complications: There were no known notable events for this encounter.

## 2022-12-10 ENCOUNTER — Encounter (HOSPITAL_COMMUNITY): Payer: Self-pay | Admitting: Cardiology

## 2022-12-10 DIAGNOSIS — I4819 Other persistent atrial fibrillation: Secondary | ICD-10-CM | POA: Diagnosis not present

## 2022-12-10 DIAGNOSIS — I10 Essential (primary) hypertension: Secondary | ICD-10-CM | POA: Diagnosis not present

## 2022-12-10 DIAGNOSIS — Z87891 Personal history of nicotine dependence: Secondary | ICD-10-CM | POA: Diagnosis not present

## 2022-12-10 DIAGNOSIS — Z7901 Long term (current) use of anticoagulants: Secondary | ICD-10-CM | POA: Diagnosis not present

## 2022-12-10 NOTE — Discharge Instructions (Signed)

## 2022-12-10 NOTE — Discharge Summary (Signed)
ELECTROPHYSIOLOGY PROCEDURE DISCHARGE SUMMARY    Patient ID: Alexander Carey,  MRN: [id number], DOB/AGE: 04/13/1951 72 y.o.  Admit date: 12/09/2022 Discharge date: 12/10/2022  Primary Care Physician: Valera Castle, MD  Primary Cardiologist: Alexander Axe, MD  Electrophysiologist: Dr. Quentin Ore   Primary Discharge Diagnosis:  Atrial Fibrillation, persistent  Secondary Discharge Diagnosis:  HTN  Procedures This Admission:  1.  Electrophysiology study and radiofrequency catheter ablation of Atrial Fibrillation on 12/09/22 by Dr. Quentin Ore .  This study demonstrated ; 1. Successful redo PVI (reisolation of bilateral pulmonary veins) 2. Successful ablation/isolation of the posterior wall 3. Intracardiac echo reveals trivial pericardial effusion, dilated LA 4. No early apparent complications.       Brief HPI: Alexander Carey is a 72 y.o. male with a history of Atrial Fibrillation.  They have failed prior ablation. Risks, benefits, and alternatives to catheter ablation of Atrial Fibrillation were reviewed with the patient who wished to proceed.   The patient has been on uninterrupted anticoagulation for more than 3 weeks and did not require TEE.  Hospital Course:  The patient was admitted and underwent EPS/RFCA of Atrial Fibrillation with details as outlined above.  They were monitored on telemetry overnight which demonstrated NSR.  Groin was without complication on the day of discharge.  The patient was examined and considered to be stable for discharge.  Wound care and restrictions were reviewed with the patient.  The patient will be seen back by Afib Clinic in 4 weeks and Dr. Quentin Ore in 12 weeks for post ablation follow up.   CHA2DS2VASC is at least 2  Physical Exam: Vitals:   12/09/22 1346 12/09/22 1700 12/09/22 1925 12/10/22 0430  BP: 126/84 (!) 129/90 111/83 112/76  Pulse: 79 78 94 66  Resp: 16 16 18 15   Temp: 97.7 F (36.5 C)  97.7 F (36.5 C) 97.7 F (36.5 C)   TempSrc: Oral  Oral Oral  SpO2: 96% 97% 98% 98%  Weight:      Height:        GEN- NAD. A&O x 3.  HEENT: Normocephalic, atraumatic Lungs- CTAB, Normal effort.  Heart- RRR, No M/G/R.  GI- Soft, NT, ND.  Extremities- No clubbing, cyanosis, or edema;  Skin- warm and dry, no rash or lesion, left chest without hematoma/ecchymosis  Discharge Medications:  Allergies as of 12/10/2022       Reactions   Protamine Other (See Comments)   Hypotension    Statins Other (See Comments)   Patient declines any statins   Ciprofloxacin Rash        Medication List     TAKE these medications    acetaminophen 650 MG CR tablet Commonly known as: TYLENOL Take 1 tablet (650 mg total) by mouth as needed for pain (mild). What changed:  how much to take when to take this   albuterol 108 (90 Base) MCG/ACT inhaler Commonly known as: VENTOLIN HFA Inhale 1-2 puffs into the lungs every 6 (six) hours as needed for wheezing or shortness of breath.   Colace 100 MG capsule Generic drug: docusate sodium Take 100 mg by mouth daily as needed for mild constipation or moderate constipation.   colchicine 0.6 MG tablet Take 1 tablet (0.6 mg total) by mouth 2 (two) times daily for 5 days.   diltiazem 30 MG tablet Commonly known as: Cardizem Take 1 tablet every 4 hours AS NEEDED for AFIB heart rate >110 as long as top BP >100.   dutasteride 0.5 MG capsule Commonly  known as: AVODART TAKE ONE (1) CAPSULE EACH DAY.   Eliquis 5 MG Tabs tablet Generic drug: apixaban TAKE (1) TABLET BY MOUTH TWICE DAILY   fluorouracil 5 % cream Commonly known as: EFUDEX Apply to affected areas of the face BID x 4 days and the ears BID x 7 days.   Ipratropium-Albuterol 20-100 MCG/ACT Aers respimat Commonly known as: COMBIVENT Inhale 1 puff into the lungs every 6 (six) hours as needed for wheezing or shortness of breath.   levothyroxine 25 MCG tablet Commonly known as: SYNTHROID Take 1 tablet (25 mcg total) by mouth  daily before breakfast.   metoprolol tartrate 25 MG tablet Commonly known as: LOPRESSOR Take 1.5 tablets (37.5 mg total) by mouth 2 (two) times daily.   pantoprazole 40 MG tablet Commonly known as: Protonix Take 1 tablet (40 mg total) by mouth daily.   tamsulosin 0.4 MG Caps capsule Commonly known as: FLOMAX TAKE (1) CAPSULE BY MOUTH EVERY DAY   triamcinolone ointment 0.1 % Commonly known as: KENALOG Apply to aa's rash BID up to two weeks. Avoid applying to face, groin, and axilla. Use as directed. Long-term use can cause thinning of the skin. What changed:  how much to take how to take this when to take this reasons to take this        Disposition:    [facility] Follow up.   Specialty: Cardiology Why: on 4/23 at 130 for post ablation follow up. Located on the 6th floor of [place] information: [id number] [place] [address] [phone]                Duration of Discharge Encounter: Greater than 30 minutes including physician time.  Jacalyn Lefevre, PA-C  12/10/2022 7:35 AM

## 2022-12-10 NOTE — Progress Notes (Signed)
PIV removed. Discharge instructions completed. Patient verbalized understanding of medication regimen, follow up appointments and discharge instructions. Patient belongings gathered and packed to discharge with wife,

## 2022-12-11 ENCOUNTER — Encounter: Payer: Self-pay | Admitting: Cardiology

## 2022-12-11 ENCOUNTER — Telehealth: Payer: Self-pay | Admitting: Cardiology

## 2022-12-11 MED ORDER — FUROSEMIDE 20 MG PO TABS: 20.0000 mg | ORAL_TABLET | Freq: Every day | ORAL | 0 refills | Status: DC

## 2022-12-11 MED ORDER — POTASSIUM CHLORIDE ER 10 MEQ PO TBCR: 10.0000 meq | EXTENDED_RELEASE_TABLET | Freq: Every day | ORAL | 0 refills | Status: DC

## 2022-12-11 NOTE — Telephone Encounter (Signed)
Patient wife states pt with orthopnea overnight, weight is up 10lbs, lower extremity swelling. Pt did receive increase IVF post ablation due to hypotension. BP currently 140/86 in NSR. Oxygen sats 95-97%. Discussed with Adline Peals PA will give lasix 20mg  and Kdur 68meq now - call in AM with response. Pt wife in agreement.

## 2022-12-11 NOTE — Telephone Encounter (Signed)
Addressed in separate telephone note.

## 2022-12-11 NOTE — Telephone Encounter (Signed)
Addressed in separate telephone encounter.

## 2022-12-11 NOTE — Telephone Encounter (Signed)
Pt c/o Shortness Of Breath: STAT if SOB developed within the last 24 hours or pt is noticeably SOB on the phone  1. Are you currently SOB (can you hear that pt is SOB on the phone)? No  2. How long have you been experiencing SOB? Yesterday afternoon  3. Are you SOB when sitting or when up moving around? More so when moving, but also with sitting  4. Are you currently experiencing any other symptoms? Light headache  Pt stated that they had their ablation on Tuesday. When he got home from the hospital yesterday afternoon, the pt started to become out of breath. Please advise

## 2022-12-12 NOTE — Telephone Encounter (Signed)
Weight down 2lbs was able to sleep in the bed last night but still with some shortness of breath. Discussed with Alexander Palau NP will continue lasix 20mg  and Kdur 69meq daily through weekend call with update on Monday. Pt in agreement.

## 2022-12-15 ENCOUNTER — Telehealth (HOSPITAL_COMMUNITY): Payer: Self-pay

## 2022-12-15 DIAGNOSIS — I4891 Unspecified atrial fibrillation: Secondary | ICD-10-CM

## 2022-12-15 NOTE — Telephone Encounter (Signed)
Patient called and states he still has some swelling. He has lost 13 pounds since last Thursday. Consulted with [employer] patient should continue taking the Lasix 20 mg along with his Potassium 20 meq. He will need to have a BMET performed next week to check his kidney function. Patient is aware I will mail him a lab requisition to have his blood work performed at [facility]. He was told to have them to fax [place] results. Consulted with patient and he verbalized understanding.

## 2022-12-22 ENCOUNTER — Other Ambulatory Visit (HOSPITAL_COMMUNITY): Payer: Self-pay | Admitting: Physician Assistant

## 2022-12-22 ENCOUNTER — Other Ambulatory Visit (HOSPITAL_COMMUNITY): Payer: Self-pay | Admitting: *Deleted

## 2022-12-22 MED ORDER — METOPROLOL TARTRATE 25 MG PO TABS: 37.5000 mg | ORAL_TABLET | Freq: Two times a day (BID) | ORAL | 3 refills | Status: DC

## 2022-12-23 LAB — BASIC METABOLIC PANEL
BUN/Creatinine Ratio: 9 — ABNORMAL LOW (ref 10–24)
BUN: 8 mg/dL (ref 8–27)
CO2: 21 mmol/L (ref 20–29)
Calcium: 9.1 mg/dL (ref 8.6–10.2)
Chloride: 100 mmol/L (ref 96–106)
Creatinine, Ser: 0.86 mg/dL (ref 0.76–1.27)
Glucose: 133 mg/dL — ABNORMAL HIGH (ref 70–99)
Potassium: 4.5 mmol/L (ref 3.5–5.2)
Sodium: 135 mmol/L (ref 134–144)
eGFR: 92 mL/min/{1.73_m2} (ref >59)

## 2022-12-25 ENCOUNTER — Other Ambulatory Visit (HOSPITAL_COMMUNITY): Payer: Self-pay | Admitting: *Deleted

## 2022-12-25 MED ORDER — FUROSEMIDE 20 MG PO TABS: 20.0000 mg | ORAL_TABLET | Freq: Every day | ORAL | 3 refills | Status: DC

## 2022-12-25 MED ORDER — POTASSIUM CHLORIDE ER 10 MEQ PO TBCR: 10.0000 meq | EXTENDED_RELEASE_TABLET | Freq: Every day | ORAL | 3 refills | Status: DC

## 2022-12-29 ENCOUNTER — Other Ambulatory Visit: Payer: Self-pay | Admitting: Urology

## 2023-01-05 ENCOUNTER — Other Ambulatory Visit: Payer: Self-pay | Admitting: Internal Medicine

## 2023-01-05 DIAGNOSIS — R1032 Left lower quadrant pain: Secondary | ICD-10-CM

## 2023-01-06 ENCOUNTER — Encounter (HOSPITAL_COMMUNITY): Payer: Self-pay | Admitting: Physician Assistant

## 2023-01-06 ENCOUNTER — Ambulatory Visit (HOSPITAL_COMMUNITY)
Admission: RE | Admit: 2023-01-06 | Discharge: 2023-01-06 | Disposition: A | Discharge status: Home / Self Care | Payer: PPO | Source: Ambulatory Visit | Attending: Physician Assistant | Admitting: Physician Assistant

## 2023-01-06 VITALS — BP 134/82 | HR 60 | Ht 72.0 in | Wt 229.8 lb

## 2023-01-06 DIAGNOSIS — Z79899 Other long term (current) drug therapy: Secondary | ICD-10-CM | POA: Insufficient documentation

## 2023-01-06 DIAGNOSIS — D6869 Other thrombophilia: Secondary | ICD-10-CM | POA: Insufficient documentation

## 2023-01-06 DIAGNOSIS — Z7901 Long term (current) use of anticoagulants: Secondary | ICD-10-CM | POA: Diagnosis not present

## 2023-01-06 DIAGNOSIS — E8770 Fluid overload, unspecified: Secondary | ICD-10-CM | POA: Diagnosis not present

## 2023-01-06 DIAGNOSIS — I10 Essential (primary) hypertension: Secondary | ICD-10-CM | POA: Insufficient documentation

## 2023-01-06 DIAGNOSIS — I4819 Other persistent atrial fibrillation: Secondary | ICD-10-CM | POA: Diagnosis not present

## 2023-01-06 DIAGNOSIS — I440 Atrioventricular block, first degree: Secondary | ICD-10-CM | POA: Diagnosis not present

## 2023-01-06 MED ORDER — METOPROLOL TARTRATE 25 MG PO TABS: 25.0000 mg | ORAL_TABLET | Freq: Two times a day (BID) | ORAL | 3 refills | Status: DC

## 2023-01-06 MED ORDER — POTASSIUM CHLORIDE ER 10 MEQ PO TBCR: 10.0000 meq | EXTENDED_RELEASE_TABLET | Freq: Every day | ORAL | 3 refills | Status: AC | PRN

## 2023-01-06 MED ORDER — FUROSEMIDE 20 MG PO TABS: 20.0000 mg | ORAL_TABLET | Freq: Every day | ORAL | 3 refills | Status: DC | PRN

## 2023-01-06 NOTE — Progress Notes (Signed)
Primary Care Physician: Dione Housekeeper, MD Referring Physician: Dr. Johney Frame Primary EP: Dr Garen Grams is a 72 y.o. male with a h/o persistent afib with termination pauses, failing flecainide, Rythmol and currently on  Amiodarone. He is in the afib clinic for one month follow up from ablation. He is in SR. Has not noted any afib since the procedure.  No swallowing or groin  issues.  F/u in afib clinic 10/24/21. He is here for one year f/u. He has been off amiodarone since shortly after ablation and is staying in SR. However, he has noted BP's at hone running above 150 sys. It is 160/86 in the clinic today.  F/u in afib clinic 10/01/22. He recently saw Dr. Lalla Brothers and is pending an ablation in March. He is now in the afib clinic as he went back into afib on Saturday and missed a dose of his anticoagulation on Sunday. He feels weak and dizzy in afib. He has resumed SR spontaneously in the past after 1-2 days. He has tried an occasional extra 1/2 metoprolol since Saturday.Marland Kitchen His v rate today is 104 bpm. Dr. Lalla Brothers  plans to admit him for Tikosyn after the ablation if he does not see any good targets to ablate.  F/u in  afib clinic, 11/12/22, as pt did not require cardioversion on last visit as he went back into SR so it was cancelled. However, he went back into afib within 3 days and is uncomfortable with fatigue, shortness of breath and dizziness. He is pending ablation end of March but wants to see if he can resume SR  for a few weeks prior to ablation so he can feel rested prior to going into procedure.  EKG today shows afib at 96 bpm. No missed anticoagulation.   Follow up in the AF clinic 01/06/23. Patient is s/p repeat afib ablation with Dr Lalla Brothers on 12/10/22. He has done well from an afib standpoint with no symptoms. However, he did have SOB, lower extremity edema, and orthopnea the day after ablation. He was started on Lasix and lost ~20 lbs. He feels he is at his dry weight now.  He denies chest pain, swallowing pain, or groin issues.   Today, he denies symptoms of palpitations, chest pain, shortness of breath, orthopnea, PND, lower extremity edema, dizziness, presyncope, syncope, or neurologic sequela. The patient is tolerating medications without difficulties and is otherwise without complaint today.   Past Medical History:  Diagnosis Date   AF (atrial fibrillation)    Back pain    lower   Basal cell carcinoma 11/01/2019   nasal tip, right ear concha   Basal cell carcinoma 11/06/2022   Left lower cutaneous lip superior. Nodular. Mohs pending   Basal cell carcinoma 11/06/2022   Left lower cutaneous lip inferior. Nodular. Mohs pending.   ED (erectile dysfunction)    ED (erectile dysfunction)    Enlarged prostate    Essential hypertension    HOH (hard of hearing)    Hypogonadism in male    Hypothyroidism    Paroxysmal A-fib    a. 07/2018 s/p PVI.   Past Surgical History:  Procedure Laterality Date   ATRIAL FIBRILLATION ABLATION N/A 07/27/2018   Procedure: ATRIAL FIBRILLATION ABLATION;  Surgeon: Hillis Range, MD;  Location: [facility];  Service: Cardiovascular;  Laterality: N/A;   ATRIAL FIBRILLATION ABLATION N/A 12/09/2022   Procedure: ATRIAL FIBRILLATION ABLATION;  Surgeon: Lanier Prude, MD;  Location: [facility];  Service: Cardiovascular;  Laterality: N/A;   CARDIOVERSION N/A 01/13/2018   Procedure: CARDIOVERSION;  Surgeon: Yvonne Kendall, MD;  Location: [facility];  Service: Cardiovascular;  Laterality: N/A;   CARDIOVERSION N/A 01/26/2018   Procedure: CARDIOVERSION;  Surgeon: Antonieta Iba, MD;  Location: [facility];  Service: Cardiovascular;  Laterality: N/A;   CARDIOVERSION N/A 03/01/2018   Procedure: CARDIOVERSION;  Surgeon: Antonieta Iba, MD;  Location: [facility];  Service: Cardiovascular;  Laterality: N/A;   CARDIOVERSION N/A 05/21/2018   Procedure: CARDIOVERSION;  Surgeon: Iran Ouch, MD;  Location: [facility];  Service:  Cardiovascular;  Laterality: N/A;   CARDIOVERSION N/A 11/18/2022   Procedure: CARDIOVERSION;  Surgeon: Lewayne Bunting, MD;  Location: [facility];  Service: Cardiovascular;  Laterality: N/A;   COLONOSCOPY  04/2005   COLONOSCOPY WITH PROPOFOL N/A 03/28/2020   Procedure: COLONOSCOPY WITH PROPOFOL;  Surgeon: Earline Mayotte, MD;  Location: [facility];  Service: Endoscopy;  Laterality: N/A;   INCISION AND DRAINAGE Right 06/26/2021   Procedure: INCISION AND DRAINAGE;  Surgeon: Juanell Fairly, MD;  Location: [facility];  Service: Orthopedics;  Laterality: Right;   KNEE ARTHROSCOPY Right 06/20/2021   Procedure: RIGHT KNEE DIAGNOSTIC  ARTHROSCOPY;  Surgeon: Juanell Fairly, MD;  Location: [facility];  Service: Orthopedics;  Laterality: Right;   KNEE ARTHROSCOPY Right 06/26/2021   Procedure: ARTHROSCOPY KNEE;  Surgeon: Juanell Fairly, MD;  Location: [facility];  Service: Orthopedics;  Laterality: Right;   KNEE ARTHROSCOPY WITH MEDIAL MENISECTOMY Right 05/30/2021   Procedure: KNEE ARTHROSCOPY WITH MEDIAL AND LATERAL MENISECTOMIES AND CHONDROPLASTY OF THE PATELLA;  Surgeon: Juanell Fairly, MD;  Location: [facility];  Service: Orthopedics;  Laterality: Right;   TONSILLECTOMY     TRIGGER FINGER RELEASE Left 12/03/2016   Procedure: 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 (Patient taking differently: Take 1,300 mg by mouth every 8 (eight) hours as needed for pain (mild).)     albuterol (VENTOLIN HFA) 108 (90 Base) MCG/ACT inhaler Inhale 1-2 puffs into the lungs every 6 (six) hours as needed for wheezing or shortness of breath.     apixaban (ELIQUIS) 5 MG TABS tablet TAKE (1) TABLET BY MOUTH  TWICE DAILY 180 tablet 2   diltiazem (CARDIZEM) 30 MG tablet Take 1 tablet every 4 hours AS NEEDED for AFIB heart rate >110 as long as top BP >100. 30 tablet 1   docusate sodium (COLACE) 100 MG capsule Take 100 mg by mouth daily as needed for mild constipation or moderate constipation.     dutasteride (AVODART) 0.5 MG capsule TAKE ONE (1) CAPSULE EACH DAY. 90 capsule 3   fluorouracil (EFUDEX) 5 % cream Apply to affected areas of the face BID x 4 days and the ears BID x 7 days. 30 g 1   Ipratropium-Albuterol (COMBIVENT) 20-100 MCG/ACT AERS respimat Inhale 1 puff into the lungs every 6 (six) hours as needed for wheezing or shortness of breath.     levothyroxine (SYNTHROID, LEVOTHROID) 25 MCG tablet Take 1 tablet (25 mcg total) by mouth daily before breakfast. 30 tablet 1   tamsulosin (FLOMAX) 0.4 MG CAPS capsule TAKE (1) CAPSULE BY MOUTH EVERY DAY 90 capsule 0   triamcinolone ointment (KENALOG)  0.1 % Apply to aa's rash BID up to two weeks. Avoid applying to face, groin, and axilla. Use as directed. Long-term use can cause thinning of the skin. (Patient taking differently: Apply 1 Application topically 2 (two) times daily as needed (irritation). Apply to aa's rash BID up to two weeks. Avoid applying to face, groin, and axilla. Use as directed. Long-term use can cause thinning of the skin.) 80 g 0   furosemide (LASIX) 20 MG tablet Take 1 tablet (20 mg total) by mouth daily as needed for fluid. 30 tablet 3   metoprolol tartrate (LOPRESSOR) 25 MG tablet Take 1 tablet (25 mg total) by mouth 2 (two) times daily. 90 tablet 3   potassium chloride (KLOR-CON) 10 MEQ tablet Take 1 tablet (10 mEq total) by mouth daily as needed (on days lasix is taken). 30 tablet 3   No current facility-administered medications for this encounter.    Allergies  Allergen Reactions   Protamine Other (See Comments)    Hypotension    Statins Other (See Comments)    Patient declines any statins   Ciprofloxacin Rash    Social  History   Socioeconomic History   Marital status: Married    Spouse name: Not on file   Number of children: Not on file   Years of education: Not on file   Highest education level: Not on file  Occupational History   Not on file  Tobacco Use   Smoking status: Former    Packs/day: .5    Types: Cigarettes    Quit date: 09/15/2008    Years since quitting: 14.3   Smokeless tobacco: Never   Tobacco comments:    Former smoker 01/06/23  Vaping Use   Vaping Use: Never used  Substance and Sexual Activity   Alcohol use: Yes    Alcohol/week: 8.0 standard drinks of alcohol    Types: 8 Shots of liquor per week    Comment: on the weekend- has a few shots 01/06/23   Drug use: No   Sexual activity: Not on file  Other Topics Concern   Not on file  Social History Narrative   Lives in [place] with spouse   Owns rental property   Social Determinants of Health   Financial Resource Strain: Not on file  Food Insecurity: Not on file  Transportation Needs: Not on file  Physical Activity: Not on file  Stress: Not on file  Social Connections: Not on file  Intimate Partner Violence: Not on file    Family History  Problem Relation Age of Onset   Ovarian cancer Mother    Throat cancer Father    Prostate cancer Neg Hx    Kidney cancer Neg Hx     ROS- All systems are reviewed and negative except as per the HPI above  Physical Exam: Vitals:   01/06/23 1327  BP: 134/82  Pulse: 60  Weight: 104.2 kg  Height: 6' (1.829 m)    Wt Readings from Last 3 Encounters:  01/06/23 104.2 kg  12/09/22 102.1 kg  11/18/22 102.1 kg    Labs: Lab Results  Component Value Date   NA 135 12/22/2022   K 4.5 12/22/2022   CL 100 12/22/2022   CO2 21 12/22/2022   GLUCOSE 133 (H) 12/22/2022   BUN 8 12/22/2022   CREATININE 0.86 12/22/2022   CALCIUM 9.1 12/22/2022   Lab Results  Component Value Date   INR 0.97 10/29/2017   No results found for: "CHOL", "HDL", "LDLCALC", "TRIG"  GEN- The  patient is a well appearing male, alert and oriented x 3 today.   HEENT-head normocephalic, atraumatic, sclera clear, conjunctiva pink, hearing intact, trachea midline. Lungs- Clear to ausculation bilaterally, normal work of breathing Heart- Regular rate and rhythm, no murmurs, rubs or gallops  GI- soft, NT, ND, + BS Extremities- no clubbing, cyanosis, or edema MS- no significant deformity or atrophy Skin- no rash or lesion Psych- euthymic mood, full affect Neuro- strength and sensation are intact   EKG today demonstrates  SR, 1st degree AV block Vent. rate 60 BPM PR interval 208 ms QRS duration 84 ms QT/QTcB 416/416 ms   CHA2DS2-VASc Score = 2  The patient's score is based upon: CHF History: 0 HTN History: 1 Diabetes History: 0 Stroke History: 0 Vascular Disease History: 0 Age Score: 1 Gender Score: 0       ASSESSMENT AND PLAN: 1. Persistent Atrial Fibrillation (ICD10:  I48.19) The patient's CHA2DS2-VASc score is 2, indicating a 2.2% annual risk of stroke.   Previously on amiodarone, flecainide, and propafenone.  S/p afib ablation 2021 and 12/09/22 Patient appears to be maintaining SR. Continue Lopressor 25 mg BID (did not tolerate 37.5 mg) Continue Eliquis 5 mg BID with no missed doses for 3 months post ablation.  Continue diltiazem 30 mg q 4 hours PRN for heart racing.   2. Secondary Hypercoagulable State (ICD10:  D68.69) The patient is at significant risk for stroke/thromboembolism based upon his CHA2DS2-VASc Score of 2.  Continue Apixaban (Eliquis).   3. HTN Stable, no changes today.   4. Fluid overload Post ablation, resolved with Lasix. Will decrease Lasix to 20 mg PRN daily for 2-3 lbs weight gain overnight or 5-6 lbs in one week. KCL 10 meq PRN daily with Lasix.    Follow up with Dr Lalla Brothers as scheduled.    Jorja Loa PA-C Afib Clinic [facility] [address] [place], [place] [address] [phone]

## 2023-01-07 ENCOUNTER — Ambulatory Visit
Admission: RE | Admit: 2023-01-07 | Discharge: 2023-01-07 | Disposition: A | Discharge status: Home / Self Care | Payer: PPO | Source: Ambulatory Visit | Attending: Internal Medicine | Admitting: Internal Medicine

## 2023-01-07 DIAGNOSIS — R1032 Left lower quadrant pain: Secondary | ICD-10-CM | POA: Insufficient documentation

## 2023-01-07 MED ORDER — IOHEXOL 300 MG/ML  SOLN
100.0000 mL | Freq: Once | INTRAMUSCULAR | Status: AC | PRN
  Administered 2023-01-07: 100 mL via INTRAVENOUS

## 2023-01-08 ENCOUNTER — Telehealth (HOSPITAL_COMMUNITY): Payer: Self-pay | Admitting: *Deleted

## 2023-01-08 NOTE — Telephone Encounter (Signed)
Patient called in stating this morning he returned to Af. Heart rates in the 150s initially but now in the 130s. He has taken his morning medications - pt will increase metoprolol to 37.5mg  twice a day until return to normal rhythm.  If AF persists he will call back for further recommendations.

## 2023-02-05 ENCOUNTER — Ambulatory Visit: Payer: PPO | Admitting: Dermatology

## 2023-02-05 VITALS — BP 134/82

## 2023-02-05 DIAGNOSIS — L814 Other melanin hyperpigmentation: Secondary | ICD-10-CM

## 2023-02-05 DIAGNOSIS — L578 Other skin changes due to chronic exposure to nonionizing radiation: Secondary | ICD-10-CM

## 2023-02-05 DIAGNOSIS — D1801 Hemangioma of skin and subcutaneous tissue: Secondary | ICD-10-CM

## 2023-02-05 DIAGNOSIS — L821 Other seborrheic keratosis: Secondary | ICD-10-CM | POA: Diagnosis not present

## 2023-02-05 DIAGNOSIS — L570 Actinic keratosis: Secondary | ICD-10-CM | POA: Diagnosis not present

## 2023-02-05 DIAGNOSIS — Z5111 Encounter for antineoplastic chemotherapy: Secondary | ICD-10-CM

## 2023-02-05 DIAGNOSIS — Z85828 Personal history of other malignant neoplasm of skin: Secondary | ICD-10-CM

## 2023-02-05 DIAGNOSIS — W908XXA Exposure to other nonionizing radiation, initial encounter: Secondary | ICD-10-CM

## 2023-02-05 DIAGNOSIS — B353 Tinea pedis: Secondary | ICD-10-CM

## 2023-02-05 DIAGNOSIS — Z1283 Encounter for screening for malignant neoplasm of skin: Secondary | ICD-10-CM | POA: Diagnosis not present

## 2023-02-05 DIAGNOSIS — X32XXXA Exposure to sunlight, initial encounter: Secondary | ICD-10-CM

## 2023-02-05 NOTE — Progress Notes (Signed)
Follow-Up Visit   Subjective  Alexander Carey is a 72 y.o. male who presents for the following: Skin Cancer Screening and Full Body Skin Exam  The patient presents for Total-Body Skin Exam (TBSE) for skin cancer screening and mole check. The patient has spots, moles and lesions to be evaluated, some may be new or changing and the patient has concerns that these could be cancer.  Hx BCC. Patient used Skin Medicinals 5FU/calcipotriene to treat face, nose and ears with good reaction per patient.   The following portions of the chart were reviewed this encounter and updated as appropriate: medications, allergies, medical history  Review of Systems:  No other skin or systemic complaints except as noted in HPI or Assessment and Plan.  Objective  Well appearing patient in no apparent distress; mood and affect are within normal limits.  A full examination was performed including scalp, head, eyes, ears, nose, lips, neck, chest, axillae, abdomen, back, buttocks, bilateral upper extremities, bilateral lower extremities, hands, feet, fingers, toes, fingernails, and toenails. All findings within normal limits unless otherwise noted below.   Relevant physical exam findings are noted in the Assessment and Plan.  hypertrophic at L elbow x 1 Erythematous thin papules/macules with gritty scale.   Left Upper Arm 0.9 cm brown macule    Assessment & Plan   LENTIGINES, SEBORRHEIC KERATOSES, HEMANGIOMAS - Benign normal skin lesions - Benign-appearing - Call for any changes  MELANOCYTIC NEVI - Tan-brown and/or pink-flesh-colored symmetric macules and papules - Benign appearing on exam today - Observation - Call clinic for new or changing moles - Recommend daily use of broad spectrum spf 30+ sunscreen to sun-exposed areas.   ACTINIC DAMAGE WITH PRECANCEROUS ACTINIC KERATOSES Counseling for Topical Chemotherapy Management: Patient exhibits: - Severe, confluent actinic changes with pre-cancerous  actinic keratoses that is secondary to cumulative UV radiation exposure over time - Condition that is severe; chronic, not at goal. - diffuse scaly erythematous macules and papules with underlying dyspigmentation - Discussed Prescription "Field Treatment" topical Chemotherapy for Severe, Chronic Confluent Actinic Changes with Pre-Cancerous Actinic Keratoses Field treatment involves treatment of an entire area of skin that has confluent Actinic Changes (Sun/ Ultraviolet light damage) and PreCancerous Actinic Keratoses by method of PhotoDynamic Therapy (PDT) and/or prescription Topical Chemotherapy agents such as 5-fluorouracil, 5-fluorouracil/calcipotriene, and/or imiquimod.  The purpose is to decrease the number of clinically evident and subclinical PreCancerous lesions to prevent progression to development of skin cancer by chemically destroying early precancer changes that may or may not be visible.  It has been shown to reduce the risk of developing skin cancer in the treated area. As a result of treatment, redness, scaling, crusting, and open sores may occur during treatment course. One or more than one of these methods may be used and may have to be used several times to control, suppress and eliminate the PreCancerous changes. Discussed treatment course, expected reaction, and possible side effects. - Recommend daily broad spectrum sunscreen SPF 30+ to sun-exposed areas, reapply every 2 hours as needed.  - Staying in the shade or wearing long sleeves, sun glasses (UVA+UVB protection) and wide brim hats (4-inch brim around the entire circumference of the hat) are also recommended. - Call for new or changing lesions. - Recommend taking Heliocare sun protection supplement daily in sunny weather for additional sun protection. For maximum protection on the sunniest days, you can take up to 2 capsules of regular Heliocare OR take 1 capsule of Heliocare Ultra.   - Start 5-fluorouracil/calcipotriene  cream  twice a day for 7 - 14 days to affected areas including ears, left medial ankle and twice daily for 4 - 8 days at nose, right cheek. Stop once getting red and crusty. Patient provided with handout reviewing treatment course and side effects and advised to call or message [place] on MyChart with any concerns.  Reviewed course of treatment and expected reaction.  Patient advised to expect inflammation and crusting and advised that erosions are possible.  Patient advised to be diligent with sun protection during and after treatment. Counseled to keep medication out of reach of children and pets.   SKIN CANCER SCREENING PERFORMED TODAY.  HISTORY OF BASAL CELL CARCINOMA OF THE SKIN - No evidence of recurrence today - Recommend regular full body skin exams - Recommend daily broad spectrum sunscreen SPF 30+ to sun-exposed areas, reapply every 2 hours as needed.  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 They are chronic with expected duration over 1 year. A portion of actinic keratoses will progress to squamous cell carcinoma of the skin. It is not possible to reliably predict which spots will progress to skin cancer and so treatment is recommended to prevent development of skin cancer.  Recommend daily broad spectrum sunscreen SPF 30+ to sun-exposed areas, reapply every 2 hours as needed.  Recommend staying in the shade or wearing long sleeves, sun glasses (UVA+UVB protection) and wide brim hats (4-inch brim around the entire circumference of the hat). Call for new or changing lesions.  Prior to procedure, discussed risks of blister formation, small wound, skin dyspigmentation, or rare scar following cryotherapy. Recommend Vaseline ointment to treated areas while healing.   Destruction of lesion -  hypertrophic at L elbow x 1  Destruction method: cryotherapy   Informed consent: discussed and consent obtained   Lesion destroyed using liquid nitrogen: Yes   Cryotherapy cycles:  2 Outcome: patient tolerated procedure well with no complications   Post-procedure details: wound care instructions given    Related Medications fluorouracil (EFUDEX) 5 % cream Apply to affected areas of the face BID x 4 days and the ears BID x 7 days.  Lentigines Left Upper Arm  Vs macular SK  Benign-appearing.  Recheck at f/u.  Call clinic for new or changing lesions.    TINEA PEDIS Exam: Scaling and maceration web spaces and over distal and lateral soles.  Treatment Plan: Start over the counter Terbinafine cream to the feet and between the toes twice daily for one month. Then apply once a week to help prevent recurrence.    Return in about 6 months (around 08/08/2023) for TBSE, Hx BCC, AK follow up.  Anise Salvo, RMA, am acting as scribe for Darden Dates, MD .   Documentation: I have reviewed the above documentation for accuracy and completeness, and I agree with the above.  Darden Dates, MD

## 2023-02-05 NOTE — Patient Instructions (Addendum)
- Start 5-fluorouracil/calcipotriene cream twice a day for 7 - 14 days to affected areas including ears, left ankle and twice daily for 4 - 8 days at nose, right cheek. Stop once getting red and crusty. Patient provided with handout reviewing treatment course and side effects and advised to call or message [place] on MyChart with any concerns.  5-Fluorouracil/Calcipotriene Patient Education   Actinic keratoses are the dry, red scaly spots on the skin caused by sun damage. A portion of these spots can turn into skin cancer with time, and treating them can help prevent development of skin cancer.   Treatment of these spots requires removal of the defective skin cells. There are various ways to remove actinic keratoses, including freezing with liquid nitrogen, treatment with creams, or treatment with a blue light procedure in the office.   5-fluorouracil cream is a topical cream used to treat actinic keratoses. It works by interfering with the growth of abnormal fast-growing skin cells, such as actinic keratoses. These cells peel off and are replaced by healthy ones. THIS CREAM SHOULD BE KEPT OUT OF REACH OF CHILDREN AND PETS AND SHOULD NOT BE USED BY PREGNANT WOMEN.  5-fluorouracil/calcipotriene is a combination of the 5-fluorouracil cream with a vitamin D analog cream called calcipotriene. The calcipotriene alone does not treat actinic keratoses. However, when it is combined with 5-fluorouracil, it helps the 5-fluorouracil treat the actinic keratoses much faster so that the same results can be achieved with a much shorter treatment time.  INSTRUCTIONS FOR 5-FLUOROURACIL/CALCIPOTRIENE CREAM:   5-fluorouracil/calcipotriene cream typically only needs to be used for 4-7 days. A thin layer should be applied twice a day to the treatment areas recommended by your physician.   If your physician prescribed you separate tubes of 5-fluourouracil and calcipotriene, apply a thin layer of 5-fluorouracil followed by a  thin layer of calcipotriene.   Avoid contact with your eyes or nostrils. Avoid applying the cream to your eyelids or lips unless directed to apply there by your physician. Do not use 5-fluorouracil/calcipotriene cream on infected or open wounds.   You will develop redness, irritation and some crusting at areas where you have pre-cancer damage/actinic keratoses. IF YOU DEVELOP PAIN, BLEEDING, OR SIGNIFICANT CRUSTING, STOP THE TREATMENT EARLY - you have already gotten a good response and the actinic keratoses should clear up well.  Wash your hands after applying 5-fluorouracil 5% cream on your skin.   A moisturizer or sunscreen with a minimum SPF 30 should be applied each morning.   Once you have finished the treatment, you can apply a thin layer of Vaseline twice a day to irritated areas to soothe and calm the areas more quickly. If you experience significant discomfort, contact your physician.  For some patients it is necessary to repeat the treatment for best results.  SIDE EFFECTS: When using 5-fluorouracil/calcipotriene cream, you may have mild irritation, such as redness, dryness, swelling, or a mild burning sensation. This usually resolves within 2 weeks. The more actinic keratoses you have, the more redness and inflammation you can expect during treatment. Eye irritation has been reported rarely. If this occurs, please let us know.   If you have any trouble using this cream, please send [place] a MyChart message or call the office. If you have any other questions about this information, please do not hesitate to ask me before you leave the office or contact me on MyChart or by phone.  Cryotherapy Aftercare  Wash gently with soap and water everyday.   Apply  Vaseline and Band-Aid daily until healed.   Start over the counter Terbinafine cream to the feet and between the toes twice daily for one month. Then apply once a week to help prevent recurrence.   Recommend taking Heliocare sun  protection supplement daily in sunny weather for additional sun protection. For maximum protection on the sunniest days, you can take up to 2 capsules of regular Heliocare OR take 1 capsule of Heliocare Ultra. For prolonged exposure (such as a full day in the sun), you can repeat your dose of the supplement 4 hours after your first dose. Heliocare can be purchased at [employer], at some [facility] or at [email or web address].    Melanoma ABCDEs  Melanoma is the most dangerous type of skin cancer, and is the leading cause of death from skin disease.  You are more likely to develop melanoma if you: Have light-colored skin, light-colored eyes, or red or blond hair Spend a lot of time in the sun Tan regularly, either outdoors or in a tanning bed Have had blistering sunburns, especially during childhood Have a close family member who has had a melanoma Have atypical moles or large birthmarks  Early detection of melanoma is key since treatment is typically straightforward and cure rates are extremely high if we catch it early.   The first sign of melanoma is often a change in a mole or a new dark spot.  The ABCDE system is a way of remembering the signs of melanoma.  A for asymmetry:  The two halves do not match. B for border:  The edges of the growth are irregular. C for color:  A mixture of colors are present instead of an even brown color. D for diameter:  Melanomas are usually (but not always) greater than 6mm - the size of a pencil eraser. E for evolution:  The spot keeps changing in size, shape, and color.  Please check your skin once per month between visits. You can use a small mirror in front and a large mirror behind you to keep an eye on the back side or your body.   If you see any new or changing lesions before your next follow-up, please call to schedule a visit.  Please continue daily skin protection including broad spectrum sunscreen SPF 30+ to sun-exposed areas, reapplying  every 2 hours as needed when you're outdoors.    Due to recent changes in healthcare laws, you may see results of your pathology and/or laboratory studies on MyChart before the doctors have had a chance to review them. We understand that in some cases there may be results that are confusing or concerning to you. Please understand that not all results are received at the same time and often the doctors may need to interpret multiple results in order to provide you with the best plan of care or course of treatment. Therefore, we ask that you please give [place] 2 business days to thoroughly review all your results before contacting the office for clarification. Should we see a critical lab result, you will be contacted sooner.   If You Need Anything After Your Visit  If you have any questions or concerns for your doctor, please call our main line at [phone] and press option 4 to reach your doctor's medical assistant. If no one answers, please leave a voicemail as directed and we will return your call as soon as possible. Messages left after 4 pm will be answered the following business day.   You may  also send [place] a message via MyChart. We typically respond to MyChart messages within 1-2 business days.  For prescription refills, please ask your pharmacy to contact our office. Our fax number is [phone].  If you have an urgent issue when the clinic is closed that cannot wait until the next business day, you can page your doctor at the number below.    Please note that while we do our best to be available for urgent issues outside of office hours, we are not available 24/7.   If you have an urgent issue and are unable to reach [place], you may choose to seek medical care at your doctor's office, retail clinic, urgent care center, or emergency room.  If you have a medical emergency, please immediately call 911 or go to the emergency department.  Pager Numbers  - Dr. Gwen Pounds: [phone]  - Dr.  Neale Burly: [phone]  - Dr. Roseanne Reno: [phone]  In the event of inclement weather, please call our main line at [phone] for an update on the status of any delays or closures.  Dermatology Medication Tips: Please keep the boxes that topical medications come in in order to help keep track of the instructions about where and how to use these. Pharmacies typically print the medication instructions only on the boxes and not directly on the medication tubes.   If your medication is too expensive, please contact our office at [phone] option 4 or send [place] a message through MyChart.   We are unable to tell what your co-pay for medications will be in advance as this is different depending on your insurance coverage. However, we may be able to find a substitute medication at lower cost or fill out paperwork to get insurance to cover a needed medication.   If a prior authorization is required to get your medication covered by your insurance company, please allow [place] 1-2 business days to complete this process.  Drug prices often vary depending on where the prescription is filled and some pharmacies may offer cheaper prices.  The website www.goodrx.com contains coupons for medications through different pharmacies. The prices here do not account for what the cost may be with help from insurance (it may be cheaper with your insurance), but the website can give you the price if you did not use any insurance.  - You can print the associated coupon and take it with your prescription to the pharmacy.  - You may also stop by our office during regular business hours and pick up a GoodRx coupon card.  - If you need your prescription sent electronically to a different pharmacy, notify our office through [facility] or by phone at [phone] option 4.

## 2023-03-11 ENCOUNTER — Ambulatory Visit: Payer: PPO | Admitting: Cardiology

## 2023-03-11 NOTE — Progress Notes (Unsigned)
  Electrophysiology Office Follow up Visit Note:    Date:  03/12/2023   ID:  [name], DOB 01/26/1951, MRN [id number]  PCP:  Dione Housekeeper, MD  [facility] Cardiologist:  Sherryl Manges, MD  [facility] Electrophysiologist:  Lanier Prude, MD    Interval History:    Alexander Carey is a 72 y.o. male who presents for a follow up visit.   He had a redo catheter ablation on December 09, 2022.  During the ablation, bilateral pulmonary vein sets were reisolated.  The posterior wall was also isolated.  The patient saw Clide Cliff in the A-fib clinic January 06, 2023.  The patient has not had recurrent arrhythmia after his ablation.  He did have some shortness of breath and lower extremity edema following the ablation that resolved with diuretics.  Today he is doing well. No sustained recurrence of arrhythmia.       Past medical, surgical, social and family history were reviewed.  ROS:   Please see the history of present illness.    All other systems reviewed and are negative.  EKGs/Labs/Other Studies Reviewed:    The following studies were reviewed today:    EKG Interpretation Date/Time:  Thursday March 12 2023 11:48:34 EDT Ventricular Rate:  59 PR Interval:  200 QRS Duration:  88 QT Interval:  440 QTC Calculation: 435 R Axis:   41  Text Interpretation: Sinus bradycardia Confirmed by Steffanie Dunn [phone]) on 03/12/2023 10:25:54 PM    Physical Exam:    VS:  BP 120/78   Pulse (!) 59   Ht 6' (1.829 m)   Wt 221 lb 9.6 oz (100.5 kg)   SpO2 99%   BMI 30.05 kg/m     Wt Readings from Last 3 Encounters:  03/12/23 221 lb 9.6 oz (100.5 kg)  01/06/23 229 lb 12.8 oz (104.2 kg)  12/09/22 225 lb (102.1 kg)     GEN:  Well nourished, well developed in no acute distress CARDIAC: RRR, no murmurs, rubs, gallops RESPIRATORY:  Clear to auscultation without rales, wheezing or rhonchi       ASSESSMENT:    1. Persistent atrial fibrillation (HCC)   2. Primary  hypertension   3. Chronic diastolic heart failure (HCC)    PLAN:    In order of problems listed above:  #Persistent atrial fibrillation Doing well after his redo catheter ablation on December 09, 2022.  Previously failed amiodarone, flecainide and propafenone.  Continue Eliquis.  #Hypertension At goal today.  Recommend checking blood pressures 1-2 times per week at home and recording the values.  Recommend bringing these recordings to the primary care physician.  #Chronic diastolic heart failure NYHA class I-II.  Rhythm control indicated.  Had significant lower extremity swelling after his catheter ablation that resolved with diuretics.  #Coronary artery disease Elevated CAC.  Start Atorvastatin 20mg  PO daily I did discuss mediterranean diet and exercise during today's appointment.  The patient should follow up in the next 6-8 weeks with Dr Briscoe Burns clinic for [facility] after starting statin.   Follow-up 12 months with EP APP or sooner as needed.       Signed, Steffanie Dunn, MD, [facility], [facility] 03/12/2023 10:34 PM    Electrophysiology [facility]

## 2023-03-12 ENCOUNTER — Encounter: Payer: Self-pay | Admitting: Cardiology

## 2023-03-12 ENCOUNTER — Ambulatory Visit: Payer: PPO | Attending: Cardiology | Admitting: Cardiology

## 2023-03-12 VITALS — BP 120/78 | HR 59 | Ht 72.0 in | Wt 221.6 lb

## 2023-03-12 DIAGNOSIS — I4819 Other persistent atrial fibrillation: Secondary | ICD-10-CM

## 2023-03-12 DIAGNOSIS — I5032 Chronic diastolic (congestive) heart failure: Secondary | ICD-10-CM | POA: Diagnosis not present

## 2023-03-12 DIAGNOSIS — I10 Essential (primary) hypertension: Secondary | ICD-10-CM

## 2023-03-12 MED ORDER — ATORVASTATIN CALCIUM 20 MG PO TABS: 20.0000 mg | ORAL_TABLET | Freq: Every day | ORAL | 3 refills | Status: DC

## 2023-03-12 NOTE — Patient Instructions (Addendum)
Call Dr. Darrold Junker office to schedule appointment in 8 weeks with labs for statin start.   Medication Instructions:  Your physician has recommended you make the following change in your medication:   START Atorvastatin 20 mg once daily   *If you need a refill on your cardiac medications before your next appointment, please call your pharmacy*   Lab Work: None  If you have labs (blood work) drawn today and your tests are completely normal, you will receive your results only by: MyChart Message (if you have MyChart) OR A paper copy in the mail If you have any lab test that is abnormal or we need to change your treatment, we will call you to review the results.   Testing/Procedures: None   Follow-Up: At [facility], you and your health needs are our priority.  As part of our continuing mission to provide you with exceptional heart care, we have created designated Provider Care Teams.  These Care Teams include your primary Cardiologist (physician) and Advanced Practice Providers (APPs -  Physician Assistants and Nurse Practitioners) who all work together to provide you with the care you need, when you need it.   Your next appointment:   1 year(s)  Provider:   Sherie Don, NP

## 2023-03-18 ENCOUNTER — Telehealth (HOSPITAL_COMMUNITY): Payer: Self-pay | Admitting: *Deleted

## 2023-03-18 MED ORDER — METOPROLOL SUCCINATE ER 25 MG PO TB24: 25.0000 mg | ORAL_TABLET | Freq: Every day | ORAL | 3 refills | Status: DC

## 2023-03-18 NOTE — Telephone Encounter (Signed)
Pt states at office visit with Dr. Lalla Brothers it was mentioned he could switch to succinate instead of tartrate. Pt would like to do this. Discussed with Jorja Loa PA will stop metoprolol tartrate and start metoprolol succinate 25mg  once a day. Pt verbalized understanding.

## 2023-03-30 ENCOUNTER — Other Ambulatory Visit: Payer: Self-pay | Admitting: Urology

## 2023-05-13 ENCOUNTER — Other Ambulatory Visit (HOSPITAL_COMMUNITY): Payer: Self-pay | Admitting: Physician Assistant

## 2023-05-15 ENCOUNTER — Telehealth (HOSPITAL_COMMUNITY): Payer: Self-pay | Admitting: *Deleted

## 2023-05-15 MED ORDER — METOPROLOL TARTRATE 25 MG PO TABS: 37.5000 mg | ORAL_TABLET | Freq: Two times a day (BID) | ORAL | 2 refills | Status: DC

## 2023-05-15 NOTE — Telephone Encounter (Signed)
Patient decided against changing formulation of metoprolol since he is stable on metoprolol tartrate 37.5mg  twice a day. Updated prescription sent to pharmacy.

## 2023-06-29 ENCOUNTER — Other Ambulatory Visit: Payer: Self-pay | Admitting: Urology

## 2023-08-06 ENCOUNTER — Encounter: Payer: PPO | Admitting: Dermatology

## 2023-09-22 ENCOUNTER — Other Ambulatory Visit: Payer: Self-pay | Admitting: Urology

## 2023-09-27 ENCOUNTER — Other Ambulatory Visit: Payer: Self-pay | Admitting: Urology

## 2023-09-28 ENCOUNTER — Other Ambulatory Visit: Payer: Self-pay | Admitting: *Deleted

## 2023-09-28 ENCOUNTER — Telehealth: Payer: Self-pay | Admitting: Urology

## 2023-09-28 MED ORDER — TAMSULOSIN HCL 0.4 MG PO CAPS: 0.4000 mg | ORAL_CAPSULE | Freq: Every day | ORAL | 0 refills | Status: DC

## 2023-09-28 NOTE — Telephone Encounter (Signed)
 Sent in 30 day of medication until appt,

## 2023-09-28 NOTE — Telephone Encounter (Signed)
 Patient's wife called to request refill for Tamsulosin. Patient has an appointment scheduled on 11/02/23, but he will run out of medication before then. Pharmacy is [facility].

## 2023-09-29 ENCOUNTER — Other Ambulatory Visit: Payer: Self-pay | Admitting: Urology

## 2023-10-20 ENCOUNTER — Encounter: Payer: PPO | Admitting: Dermatology

## 2023-10-26 ENCOUNTER — Other Ambulatory Visit: Payer: Self-pay | Admitting: Urology

## 2023-10-29 NOTE — Progress Notes (Signed)
02/14/20 10:27 AM   Alexander Carey 09/13/1951 [id number]  Referring provider: Dione Housekeeper, MD [address] [place],  [place] [address]  Urological History: 1. BPH with LU TS -PSA (10/2023) 0.13 -dutasteride 0.5 mg daily and tamsulosin 0.4 mg daily  2. ED -Risk factors: former smoker, sleep apnea, BPH, age and anticoagulants  -tadalafil 20 mg, on-demand-dosing  Chief Complaint  Patient presents with   Benign Prostatic Hypertrophy     HPI: Alexander Carey is a 73 y.o. male who presents today for a yearly follow up.   Previous records reviewed.     He has been having issues with gaining weight recently.  His diet consists mostly of vegetables and meats.  He does not eat fast food.  He is also exercising.  I PSS 7/2  He has no urinary complaints.  Patient denies any modifying or aggravating factors.  Patient denies any recent UTI's, gross hematuria, dysuria or suprapubic/flank pain.  Patient denies any fevers, chills, nausea or vomiting.     IPSS     Row Name 11/02/23 1000         International Prostate Symptom Score   How often have you had the sensation of not emptying your bladder? Less than 1 in 5     How often have you had to urinate less than every two hours? Less than half the time     How often have you found you stopped and started again several times when you urinated? Less than 1 in 5 times     How often have you found it difficult to postpone urination? Not at All     How often have you had a weak urinary stream? Less than 1 in 5 times     How often have you had to strain to start urination? Less than 1 in 5 times     How many times did you typically get up at night to urinate? 1 Time     Total IPSS Score 7       Quality of Life due to urinary symptoms   If you were to spend the rest of your life with your urinary condition just the way it is now how would you feel about that? Mostly Satisfied             Score:  1-7 Mild 8-19  Moderate 20-35 Severe   PMH: Past Medical History:  Diagnosis Date   AF (atrial fibrillation) (HCC)    Back pain    lower   Basal cell carcinoma 11/01/2019   nasal tip, right ear concha   Basal cell carcinoma 11/06/2022   Left lower cutaneous lip superior. Nodular. Mohs 12/24/22   Basal cell carcinoma 11/06/2022   Left lower cutaneous lip inferior. Nodular. Mohs 12/24/22   ED (erectile dysfunction)    ED (erectile dysfunction)    Enlarged prostate    Essential hypertension    HOH (hard of hearing)    Hypogonadism in male    Hypothyroidism    Paroxysmal A-fib (HCC)    a. 07/2018 s/p PVI.    Surgical History: Past Surgical History:  Procedure Laterality Date   ATRIAL FIBRILLATION ABLATION N/A 07/27/2018   Procedure: ATRIAL FIBRILLATION ABLATION;  Surgeon: Hillis Range, MD;  [facility]: [facility];  Service: Cardiovascular;  Laterality: N/A;   ATRIAL FIBRILLATION ABLATION N/A 12/09/2022   Procedure: ATRIAL FIBRILLATION ABLATION;  Surgeon: Lanier Prude, MD;  Location: [facility];  Service:  Cardiovascular;  Laterality: N/A;   CARDIOVERSION N/A 01/13/2018   Procedure: CARDIOVERSION;  Surgeon: Yvonne Kendall, MD;  Location: [facility];  Service: Cardiovascular;  Laterality: N/A;   CARDIOVERSION N/A 01/26/2018   Procedure: CARDIOVERSION;  Surgeon: Antonieta Iba, MD;  Location: [facility];  Service: Cardiovascular;  Laterality: N/A;   CARDIOVERSION N/A 03/01/2018   Procedure: CARDIOVERSION;  Surgeon: Antonieta Iba, MD;  Location: [facility];  Service: Cardiovascular;  Laterality: N/A;   CARDIOVERSION N/A 05/21/2018   Procedure: CARDIOVERSION;  Surgeon: Iran Ouch, MD;  Location: [facility];  Service: Cardiovascular;  Laterality: N/A;   CARDIOVERSION N/A 11/18/2022   Procedure: CARDIOVERSION;  Surgeon: Lewayne Bunting, MD;  Location: [facility] END[facility]COPY;  Service: Cardiovascular;  Laterality: N/A;   COLONOSCOPY  04/2005   COLONOSCOPY WITH PROPOFOL N/A 03/28/2020    Procedure: COLONOSCOPY WITH PROPOFOL;  Surgeon: Earline Mayotte, MD;  Location: [facility] ENDOSCOPY;  Service: Endoscopy;  Laterality: N/A;   INCISION AND DRAINAGE Right 06/26/2021   Procedure: INCISION AND DRAINAGE;  Surgeon: Juanell Fairly, MD;  Location: [facility];  Service: Orthopedics;  Laterality: Right;   KNEE ARTHROSCOPY Right 06/20/2021   Procedure: RIGHT KNEE DIAGNOSTIC  ARTHROSCOPY;  Surgeon: Juanell Fairly, MD;  Location: [facility];  Service: Orthopedics;  Laterality: Right;   KNEE ARTHROSCOPY Right 06/26/2021   Procedure: ARTHROSCOPY KNEE;  Surgeon: Juanell Fairly, MD;  Location: [facility];  Service: Orthopedics;  Laterality: Right;   KNEE ARTHROSCOPY WITH MEDIAL MENISECTOMY Right 05/30/2021   Procedure: KNEE ARTHROSCOPY WITH MEDIAL AND LATERAL MENISECTOMIES AND CHONDROPLASTY OF THE PATELLA;  Surgeon: Juanell Fairly, MD;  Location: [facility];  Service: Orthopedics;  Laterality: Right;   TONSILLECTOMY     TRIGGER FINGER RELEASE Left 12/03/2016   Procedure: 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 If you have any questions, ask your nurse or doctor.          STOP taking these medications    fluorouracil 5 % cream Commonly known as: EFUDEX   triamcinolone ointment 0.1 % Commonly known as: KENALOG       TAKE these medications    acetaminophen 650 MG CR tablet Commonly known as: TYLENOL Take 1 tablet (650 mg total) by mouth as needed for pain (mild). What changed:  how much to take when to take this   albuterol 108 (90 Base) MCG/ACT  inhaler Commonly known as: VENTOLIN HFA Inhale 1-2 puffs into the lungs every 6 (six) hours as needed for wheezing or shortness of breath.   atorvastatin 20 MG tablet Commonly known as: LIPITOR Take 1 tablet (20 mg total) by mouth daily.   Colace 100 MG capsule Generic drug: docusate sodium Take 100 mg by mouth daily as needed for mild constipation or moderate constipation.   diltiazem 30 MG tablet Commonly known as: Cardizem Take 1 tablet every 4 hours AS NEEDED for AFIB heart rate >110 as long as top BP >100.   dutasteride 0.5 MG capsule Commonly known as: AVODART TAKE  ONE (1) CAPSULE EACH DAY.   Eliquis 5 MG Tabs tablet Generic drug: apixaban TAKE (1) TABLET BY MOUTH TWICE DAILY   furosemide 20 MG tablet Commonly known as: Lasix Take 1 tablet (20 mg total) by mouth daily as needed for fluid.   Ipratropium-Albuterol 20-100 MCG/ACT Aers respimat Commonly known as: COMBIVENT Inhale 1 puff into the lungs every 6 (six) hours as needed for wheezing or shortness of breath.   levothyroxine 25 MCG tablet Commonly known as: SYNTHROID Take 1 tablet (25 mcg total) by mouth daily before breakfast.   metoprolol tartrate 25 MG tablet Commonly known as: LOPRESSOR Take 1 tablet by mouth 2 (two) times daily. What changed: Another medication with the same name was removed. Continue taking this medication, and follow the directions you see here.   potassium chloride 10 MEQ tablet Commonly known as: KLOR-CON Take 1 tablet (10 mEq total) by mouth daily as needed (on days lasix is taken).   tamsulosin 0.4 MG Caps capsule Commonly known as: FLOMAX Take 1 capsule (0.4 mg total) by mouth daily.        Allergies:  Allergies  Allergen Reactions   Protamine Other (See Comments)    Hypotension    Statins Other (See Comments)    Patient declines any statins   Ciprofloxacin Rash    Family History: Family History  Problem Relation Age of Onset   Ovarian cancer Mother    Throat  cancer Father    Prostate cancer Neg Hx    Kidney cancer Neg Hx     Social History:  reports that he quit smoking about 15 years ago. His smoking use included cigarettes. He has never used smokeless tobacco. He reports current alcohol use of about 8.0 standard drinks of alcohol per week. He reports that he does not use drugs.   Physical Exam: BP (!) 169/92 (BP Location: Left Arm, Patient Position: Sitting, Cuff Size: Large)   Pulse (!) 59   Ht 6' (1.829 m)   Wt 231 lb 3.2 oz (104.9 kg)   BMI 31.36 kg/m   Constitutional:  Well nourished. Alert and oriented, No acute distress. HEENT: [place] AT, moist mucus membranes.  Trachea midline, no masses. Cardiovascular: No clubbing, cyanosis, or edema. Respiratory: Normal respiratory effort, no increased work of breathing. Neurologic: Grossly intact, no focal deficits, moving all 4 extremities. Psychiatric: Normal mood and affect.   Laboratory Data: Prostate Specific Antigen (PSA), Screen Order: [id number] Component Ref Range & Units 10 d ago  PSA (Prostate Specific Antigen), Total <=6.49 ng/mL 0.13  Comment: [facility] PSA Screening algorithm, based on a multi-disciplinary consensus panel review of best reported practice in the literature. All recommendations and treatment decisions should be made in conjunction with the patient after discussion and counseling.  If PSA >= 6.5 ng/ml, consider referral to Urology If PSA <  6.5 ng/ml, consider screening every two years  [employer] Total-PSA Method:  The measured value of this analyte can vary depending upon the testing procedure used. Values determined on patient samples by differing testing procedures cannot be directly compared with one another, and could be cause of erroneous medical interpretation.  Resulting Agency [facility]   Specimen Collected: 10/19/23 10:38   Performed by: [facility] Last Resulted: 10/19/23 18:56  Received  From: [facility]  Result Received: 10/29/23 09:20   Hemoglobin A1C Order: [id number] Component Ref Range & Units 10 d ago  Hemoglobin A1C <5.7 % 5.6  Average Blood  Glucose (Calculated From HgBA1c Level) mg/dL 161  Resulting Agency [facility]  Narrative Performed by [facility] Between 5.7% and 6.4% is suggestive of Pre-Diabetes or controlled Diabetes. Greater than or equal to 6.5% is suggestive of Diabetes, and if more than one value, diagnostic.  Accuracy may be reduced by anemia, hemoglobinopathy, recent transfusion, sickle cell, artificial heart valve, dialysis, TIPS, severe hyperglycemia, etc.  Specimen Collected: 10/19/23 10:38   Performed by: [facility] Last Resulted: 10/19/23 20:27  Received From: [facility]  Result Received: 10/29/23 09:20   Comprehensive Metabolic Panel (CMP) Order: [id number] Component Ref Range & Units 10 d ago  Sodium 135 - 145 mmol/L 136  Potassium 3.5 - 5.0 mmol/L 4.5  Chloride 98 - 108 mmol/L 102  Carbon Dioxide (CO2) 21 - 30 mmol/L 25  Urea Nitrogen (BUN) 7 - 20 mg/dL 10  Creatinine 0.6 - 1.3 mg/dL 0.9  Glucose 70 - 811 mg/dL 96  Comment: Interpretive Data: Above is the NONFASTING reference range.  Below are the FASTING reference ranges: NORMAL:      70-99 mg/dL PREDIABETES: 914-782 mg/dL DIABETES:    > 956 mg/dL  Calcium 8.7 - 21.3 mg/dL 9.3  AST (Aspartate Aminotransferase) 15 - 41 U/L 21  ALT (Alanine Aminotransferase) 15 - 50 U/L 25  Bilirubin, Total 0.4 - 1.5 mg/dL 0.9  Alk Phos (Alkaline Phosphatase) 24 - 110 U/L 60  Albumin 3.5 - 4.8 g/dL 3.9  Protein, Total 6.2 - 8.1 g/dL 6.4  Anion Gap 3 - 12 mmol/L 9  BUN/CREA Ratio 6 - 27 11  Glomerular Filtration Rate (eGFR) mL/min/1.73sq m 90  Comment: CKD-EPI (2021) does not include patient's race in the calculation of eGFR. Monitoring changes of plasma creatinine and  eGFR over time is useful for monitoring kidney function.  This change was made on 11/13/2020.  Interpretive Ranges for eGFR(CKD-EPI 2021):  eGFR:              > 60 mL/min/1.73 sq m - Normal eGFR:              30 - 59 mL/min/1.73 sq m - Moderately Decreased eGFR:              15 - 29 mL/min/1.73 sq m - Severely Decreased eGFR:              < 15 mL/min/1.73 sq m -  Kidney Failure   Note: These eGFR calculations do not apply in acute situations when eGFR is changing rapidly or in patients on dialysis.  Resulting Agency [facility]   Specimen Collected: 10/19/23 10:38   Performed by: [facility] Last Resulted: 10/19/23 19:40  Received From: [facility]  Result Received: 10/29/23 09:20   Thyroid Stimulating Hormone (TSH) Order: [id number] Component Ref Range & Units 10 d ago  Thyroid Stimulating Hormone (TSH) 0.34 - 5.66 IU/mL 2.72  Resulting Agency [facility]   Specimen Collected: 10/19/23 10:38   Performed by: [facility] Last Resulted: 10/19/23 19:37  Received From: [facility]  Result Received: 10/29/23 09:20  I have reviewed the labs.  Pertinent Imaging: No recent imaging   Assessment & Plan:    1. BPH with LUTS -PSA stable -continue conservative management, avoiding bladder irritants and timed voiding's -Continue tamsulosin 0.4 mg daily and dutasteride 0.5 mg daily    2. Erectile dysfunction -Not a sexually active as in the past -No  need for medication refills at this visit  3. Hypogonadism -Patient having difficulty with weight loss despite diet and exercise, we will check a serum testosterone level to see if low testosterone is contributing to his weight gain  Return in about 1 year (around 11/01/2024) for PSA, I PSS .   Cloretta Ned   [facility] [address], [address] [place], [place] [address] [phone]

## 2023-11-02 ENCOUNTER — Encounter: Payer: Self-pay | Admitting: Urology

## 2023-11-02 ENCOUNTER — Other Ambulatory Visit
Admission: RE | Admit: 2023-11-02 | Discharge: 2023-11-02 | Disposition: A | Discharge status: Home / Self Care | Payer: PPO | Attending: Urology | Admitting: Urology

## 2023-11-02 ENCOUNTER — Ambulatory Visit: Payer: PPO | Admitting: Urology

## 2023-11-02 VITALS — BP 169/92 | HR 59 | Ht 72.0 in | Wt 231.2 lb

## 2023-11-02 DIAGNOSIS — N529 Male erectile dysfunction, unspecified: Secondary | ICD-10-CM | POA: Insufficient documentation

## 2023-11-02 DIAGNOSIS — E291 Testicular hypofunction: Secondary | ICD-10-CM | POA: Insufficient documentation

## 2023-11-02 DIAGNOSIS — N401 Enlarged prostate with lower urinary tract symptoms: Secondary | ICD-10-CM | POA: Diagnosis not present

## 2023-11-02 MED ORDER — TAMSULOSIN HCL 0.4 MG PO CAPS: 0.4000 mg | ORAL_CAPSULE | Freq: Every day | ORAL | 3 refills | Status: DC

## 2023-11-02 MED ORDER — DUTASTERIDE 0.5 MG PO CAPS: ORAL_CAPSULE | ORAL | 3 refills | Status: DC

## 2023-11-02 NOTE — Addendum Note (Signed)
Addended by: Frankey Shown on: 11/02/2023 10:37 AM   Modules accepted: Orders

## 2023-11-03 LAB — TESTOSTERONE: Testosterone: 616 ng/dL (ref 264–916)

## 2023-12-08 ENCOUNTER — Encounter: Payer: Self-pay | Admitting: Dermatology

## 2023-12-08 ENCOUNTER — Ambulatory Visit: Payer: PPO | Admitting: Dermatology

## 2023-12-08 DIAGNOSIS — L578 Other skin changes due to chronic exposure to nonionizing radiation: Secondary | ICD-10-CM | POA: Diagnosis not present

## 2023-12-08 DIAGNOSIS — C44219 Basal cell carcinoma of skin of left ear and external auricular canal: Secondary | ICD-10-CM | POA: Diagnosis not present

## 2023-12-08 DIAGNOSIS — L570 Actinic keratosis: Secondary | ICD-10-CM | POA: Diagnosis not present

## 2023-12-08 DIAGNOSIS — Z1283 Encounter for screening for malignant neoplasm of skin: Secondary | ICD-10-CM

## 2023-12-08 DIAGNOSIS — L814 Other melanin hyperpigmentation: Secondary | ICD-10-CM

## 2023-12-08 DIAGNOSIS — I878 Other specified disorders of veins: Secondary | ICD-10-CM

## 2023-12-08 DIAGNOSIS — L821 Other seborrheic keratosis: Secondary | ICD-10-CM

## 2023-12-08 DIAGNOSIS — D229 Melanocytic nevi, unspecified: Secondary | ICD-10-CM

## 2023-12-08 DIAGNOSIS — D1801 Hemangioma of skin and subcutaneous tissue: Secondary | ICD-10-CM

## 2023-12-08 DIAGNOSIS — D492 Neoplasm of unspecified behavior of bone, soft tissue, and skin: Secondary | ICD-10-CM | POA: Diagnosis not present

## 2023-12-08 DIAGNOSIS — D235 Other benign neoplasm of skin of trunk: Secondary | ICD-10-CM

## 2023-12-08 DIAGNOSIS — L738 Other specified follicular disorders: Secondary | ICD-10-CM

## 2023-12-08 DIAGNOSIS — T148XXA Other injury of unspecified body region, initial encounter: Secondary | ICD-10-CM

## 2023-12-08 DIAGNOSIS — W908XXA Exposure to other nonionizing radiation, initial encounter: Secondary | ICD-10-CM | POA: Diagnosis not present

## 2023-12-08 DIAGNOSIS — S50811A Abrasion of right forearm, initial encounter: Secondary | ICD-10-CM

## 2023-12-08 NOTE — Progress Notes (Signed)
 Follow-Up Visit   Subjective  Alexander Carey is a 73 y.o. male who presents for the following: Skin Cancer Screening and Full Body Skin Exam. Patient with history of BCC, Ak f/u. Patient did use 5FU cream given for Aks and did have a good reaction.  Patient accompanied by spouse.   The patient presents for Total-Body Skin Exam (TBSE) for skin cancer screening and mole check. The patient has spots, moles and lesions to be evaluated, some may be new or changing and the patient may have concern these could be cancer.   The following portions of the chart were reviewed this encounter and updated as appropriate: medications, allergies, medical history  Review of Systems:  No other skin or systemic complaints except as noted in HPI or Assessment and Plan.  Objective  Well appearing patient in no apparent distress; mood and affect are within normal limits.  A full examination was performed including scalp, head, eyes, ears, nose, lips, neck, chest, axillae, abdomen, back, buttocks, bilateral upper extremities, bilateral lower extremities, hands, feet, fingers, toes, fingernails, and toenails. All findings within normal limits unless otherwise noted below.   Relevant physical exam findings are noted in the Assessment and Plan.  L helix 9 mm scaly papule  L upper extensor arm 8mm hyperpigmented macule  Nasal dorsum x1, R earlobe x1, R post helix x1, R zygoma x2, L dorsal hand x1, L forearm x4 (10) Pink scaly macules  Assessment & Plan   SKIN CANCER SCREENING PERFORMED TODAY.  ACTINIC DAMAGE - Chronic condition, secondary to cumulative UV/sun exposure - diffuse scaly erythematous macules with underlying dyspigmentation - Recommend daily broad spectrum sunscreen SPF 30+ to sun-exposed areas, reapply every 2 hours as needed.  - Staying in the shade or wearing long sleeves, sun glasses (UVA+UVB protection) and wide brim hats (4-inch brim around the entire circumference of the hat) are  also recommended for sun protection.  - Call for new or changing lesions.  LENTIGINES, SEBORRHEIC KERATOSES, HEMANGIOMAS - Benign normal skin lesions - Benign-appearing - Call for any changes  MELANOCYTIC NEVI - Tan-brown and/or pink-flesh-colored symmetric macules and papules - Benign appearing on exam today - Observation - Call clinic for new or changing moles - Recommend daily use of broad spectrum spf 30+ sunscreen to sun-exposed areas.   HISTORY OF PRECANCEROUS ACTINIC KERATOSIS - site(s) of PreCancerous Actinic Keratosis clear today. - these may recur and new lesions may form requiring treatment to prevent transformation into skin cancer - observe for new or changing spots and contact [facility] for appointment if occur - photoprotection with sun protective clothing; sunglasses and broad spectrum sunscreen with SPF of at least 30 + and frequent self skin exams recommended - yearly exams by a dermatologist recommended for persons with history of PreCancerous Actinic Keratoses  HISTORY OF BASAL CELL CARCINOMA OF THE SKIN Multiple see history - No evidence of recurrence today - Recommend regular full body skin exams - Recommend daily broad spectrum sunscreen SPF 30+ to sun-exposed areas, reapply every 2 hours as needed.  - Call if any new or changing lesions are noted between office visits   EXCORIATION Exam: Excoriation at R forearm  Treatment Plan: Recommend vaseline. Call if not resolving.   Venous stasis  Exam: red brown discoloration of distal lower legs  Treatment plan: Recommend wearing compression socks daily.    Dilated pore of winter left upper back  Treatment plan: Benign-appearing.  Observation.  Call clinic for new or changing lesions.  Recommend daily use of broad spectrum spf 30+ sunscreen to sun-exposed areas.    NEOPLASM OF SKIN (2) L helix Skin / nail biopsy Type of biopsy: tangential   Informed consent: discussed and consent  obtained   Timeout: patient name, date of birth, surgical site, and procedure verified   Procedure prep:  Patient was prepped and draped in usual sterile fashion Prep type:  Isopropyl alcohol Anesthesia: the lesion was anesthetized in a standard fashion   Anesthetic:  1% lidocaine w/ epinephrine 1-100,000 buffered w/ 8.4% NaHCO3 Instrument used: DermaBlade   Hemostasis achieved with: pressure and aluminum chloride   Outcome: patient tolerated procedure well   Post-procedure details: sterile dressing applied and wound care instructions given   Dressing type: bandage and petrolatum   Specimen 1 - Surgical pathology Differential Diagnosis: R/o SCC  Check Margins: No 9 mm scaly papule L upper extensor arm Skin / nail biopsy Type of biopsy: tangential   Informed consent: discussed and consent obtained   Timeout: patient name, date of birth, surgical site, and procedure verified   Procedure prep:  Patient was prepped and draped in usual sterile fashion Prep type:  Isopropyl alcohol Anesthesia: the lesion was anesthetized in a standard fashion   Anesthetic:  1% lidocaine w/ epinephrine 1-100,000 buffered w/ 8.4% NaHCO3 Instrument used: DermaBlade   Hemostasis achieved with: pressure and aluminum chloride   Outcome: patient tolerated procedure well   Post-procedure details: sterile dressing applied and wound care instructions given   Dressing type: bandage and petrolatum   Specimen 2 - Surgical pathology Differential Diagnosis: R/o melanoma  Check Margins: No 8mm hyperpigmented macule AK (ACTINIC KERATOSIS) (10) Nasal dorsum x1, R earlobe x1, R post helix x1, R zygoma x2, L dorsal hand x1, L forearm x4 (10) Actinic keratoses are precancerous spots that appear secondary to cumulative UV radiation exposure/sun exposure over time. They are chronic with expected duration over 1 year. A portion of actinic keratoses will progress to squamous cell carcinoma of the skin. It is not possible to  reliably predict which spots will progress to skin cancer and so treatment is recommended to prevent development of skin cancer.  Recommend daily broad spectrum sunscreen SPF 30+ to sun-exposed areas, reapply every 2 hours as needed.  Recommend staying in the shade or wearing long sleeves, sun glasses (UVA+UVB protection) and wide brim hats (4-inch brim around the entire circumference of the hat). Call for new or changing lesions. Destruction of lesion - Nasal dorsum x1, R earlobe x1, R post helix x1, R zygoma x2, L dorsal hand x1, L forearm x4 (10) Complexity: simple   Destruction method: cryotherapy   Informed consent: discussed and consent obtained   Timeout:  patient name, date of birth, surgical site, and procedure verified Lesion destroyed using liquid nitrogen: Yes   Region frozen until ice ball extended beyond lesion: Yes   Cryo cycles: 1 or 2. Outcome: patient tolerated procedure well with no complications   Post-procedure details: wound care instructions given   MULTIPLE BENIGN NEVI   ACTINIC ELASTOSIS   SEBORRHEIC KERATOSES   CHERRY ANGIOMA   LENTIGINES   DILATED PORE OF Francee Piccolo OF BACK   EXCORIATION   Return in about 6 months (around 06/09/2024) for w/ Dr. Katrinka Blazing, AK follow-up, TBSE, Hx BCC.  Wynonia Lawman, CMA, am acting as scribe for Elie Goody, MD .   Documentation: I have reviewed the above documentation for accuracy and completeness, and I agree with the above.  Elie Goody, MD

## 2023-12-08 NOTE — Patient Instructions (Addendum)

## 2023-12-09 LAB — SURGICAL PATHOLOGY

## 2023-12-10 ENCOUNTER — Telehealth: Payer: Self-pay

## 2023-12-10 DIAGNOSIS — C44219 Basal cell carcinoma of skin of left ear and external auricular canal: Secondary | ICD-10-CM

## 2023-12-10 NOTE — Telephone Encounter (Signed)
-----   Message from [facility] sent at 12/09/2023  6:18 PM EDT ----- Diagnosis: 1. Skin, L helix :       BASAL CELL CARCINOMA, NODULAR PATTERN        2. Skin, L upper extensor arm :       SOLAR LENTIGO    Please call with diagnosis and determine where the patient would like to have Mohs surgery.  LEFT EAR Explanation: your biopsy shows a basal cell skin cancer in the second layer of the skin. This is the most common kind of skin cancer and is caused by damage from sun exposure. Basal cell skin cancers almost never spread beyond the skin, so they are not dangerous to your overall health. However, they will continue to grow, can bleed, cause nonhealing wounds, and disrupt nearby structures unless fully treated.  Treatment: Given the location and type of skin cancer, I recommend Mohs surgery. Mohs surgery involves cutting out the skin cancer and then checking under the microscope to ensure the whole skin cancer was removed. If any skin cancer remains, the surgeon will cut out more until it is fully removed. The cure rate is about 98-99%. Once the Mohs surgeon confirms the skin cancer is out, they will discuss the options to repair or heal the area. You must take it easy for about two weeks after surgery (no lifting over 10-15 lbs, avoid activity to get your heart rate and blood pressure up). It is done at another office outside of [place] ([place], [place], or [place]).  LEFT ARM Biopsy shows a benign sun spot (lentigo). No treatment needed

## 2023-12-10 NOTE — Telephone Encounter (Signed)
 Advised pt of bx results.  Discussed mohs.  Pt prefers referral sent to [facility] since that is where he has had previous mohs treatment.

## 2024-01-07 ENCOUNTER — Ambulatory Visit
Admission: RE | Admit: 2024-01-07 | Discharge: 2024-01-07 | Disposition: A | Discharge status: Home / Self Care | Attending: Family Medicine | Admitting: Family Medicine

## 2024-01-07 ENCOUNTER — Ambulatory Visit
Admission: RE | Admit: 2024-01-07 | Discharge: 2024-01-07 | Disposition: A | Discharge status: Home / Self Care | Source: Ambulatory Visit | Attending: Family Medicine | Admitting: Family Medicine

## 2024-01-07 ENCOUNTER — Other Ambulatory Visit: Payer: Self-pay | Admitting: Family Medicine

## 2024-01-07 DIAGNOSIS — R0989 Other specified symptoms and signs involving the circulatory and respiratory systems: Secondary | ICD-10-CM

## 2024-02-29 ENCOUNTER — Other Ambulatory Visit: Payer: Self-pay | Admitting: Cardiology

## 2024-03-02 MED ORDER — ATORVASTATIN CALCIUM 20 MG PO TABS: 20.0000 mg | ORAL_TABLET | Freq: Every day | ORAL | 0 refills | Status: AC

## 2024-05-11 ENCOUNTER — Other Ambulatory Visit (HOSPITAL_COMMUNITY): Payer: Self-pay | Admitting: Physician Assistant

## 2024-06-09 ENCOUNTER — Ambulatory Visit: Admitting: Dermatology

## 2024-06-09 ENCOUNTER — Encounter: Payer: Self-pay | Admitting: Dermatology

## 2024-06-09 DIAGNOSIS — S0992XA Unspecified injury of nose, initial encounter: Secondary | ICD-10-CM

## 2024-06-09 DIAGNOSIS — L821 Other seborrheic keratosis: Secondary | ICD-10-CM | POA: Diagnosis not present

## 2024-06-09 DIAGNOSIS — L578 Other skin changes due to chronic exposure to nonionizing radiation: Secondary | ICD-10-CM

## 2024-06-09 DIAGNOSIS — Z85828 Personal history of other malignant neoplasm of skin: Secondary | ICD-10-CM

## 2024-06-09 DIAGNOSIS — Z1283 Encounter for screening for malignant neoplasm of skin: Secondary | ICD-10-CM | POA: Diagnosis not present

## 2024-06-09 DIAGNOSIS — W908XXA Exposure to other nonionizing radiation, initial encounter: Secondary | ICD-10-CM

## 2024-06-09 DIAGNOSIS — L570 Actinic keratosis: Secondary | ICD-10-CM

## 2024-06-09 DIAGNOSIS — T148XXA Other injury of unspecified body region, initial encounter: Secondary | ICD-10-CM

## 2024-06-09 DIAGNOSIS — D1801 Hemangioma of skin and subcutaneous tissue: Secondary | ICD-10-CM

## 2024-06-09 DIAGNOSIS — B353 Tinea pedis: Secondary | ICD-10-CM

## 2024-06-09 DIAGNOSIS — D229 Melanocytic nevi, unspecified: Secondary | ICD-10-CM

## 2024-06-09 DIAGNOSIS — L814 Other melanin hyperpigmentation: Secondary | ICD-10-CM | POA: Diagnosis not present

## 2024-06-09 DIAGNOSIS — B354 Tinea corporis: Secondary | ICD-10-CM

## 2024-06-09 NOTE — Patient Instructions (Addendum)
 Start over the counter Lamisil cream 2 times a day to buttocks, feet, in between toes for at least 6 weeks for fungus   Cryotherapy Aftercare  Wash gently with soap and water everyday.   Apply Vaseline and Band-Aid daily until healed.    Recommend daily broad spectrum sunscreen SPF 30+ to sun-exposed areas, reapply every 2 hours as needed. Call for new or changing lesions.  Staying in the shade or wearing long sleeves, sun glasses (UVA+UVB protection) and wide brim hats (4-inch brim around the entire circumference of the hat) are also recommended for sun protection.    Due to recent changes in healthcare laws, you may see results of your pathology and/or laboratory studies on MyChart before the doctors have had a chance to review them. We understand that in some cases there may be results that are confusing or concerning to you. Please understand that not all results are received at the same time and often the doctors may need to interpret multiple results in order to provide you with the best plan of care or course of treatment. Therefore, we ask that you please give us  2 business days to thoroughly review all your results before contacting the office for clarification. Should we see a critical lab result, you will be contacted sooner.   If You Need Anything After Your Visit  If you have any questions or concerns for your doctor, please call our main line at [phone] and press option 4 to reach your doctor's medical assistant. If no one answers, please leave a voicemail as directed and we will return your call as soon as possible. Messages left after 4 pm will be answered the following business day.   You may also send us  a message via MyChart. We typically respond to MyChart messages within 1-2 business days.  For prescription refills, please ask your pharmacy to contact our office. Our fax number is [phone].  If you have an urgent issue when the clinic is closed that cannot wait  until the next business day, you can page your doctor at the number below.    Please note that while we do our best to be available for urgent issues outside of office hours, we are not available 24/7.   If you have an urgent issue and are unable to reach us , you may choose to seek medical care at your doctor's office, retail clinic, urgent care center, or emergency room.  If you have a medical emergency, please immediately call 911 or go to the emergency department.  Pager Numbers  - Dr. Hester: [phone]  - Dr. Jackquline: [phone]  - Dr. Claudene: [phone]   - Dr. Raymund: [phone]  In the event of inclement weather, please call our main line at [phone] for an update on the status of any delays or closures.  Dermatology Medication Tips: Please keep the boxes that topical medications come in in order to help keep track of the instructions about where and how to use these. Pharmacies typically print the medication instructions only on the boxes and not directly on the medication tubes.   If your medication is too expensive, please contact our office at [phone] option 4 or send us  a message through MyChart.   We are unable to tell what your co-pay for medications will be in advance as this is different depending on your insurance coverage. However, we may be able to find a substitute medication at lower cost or fill out paperwork to get insurance to cover  a needed medication.   If a prior authorization is required to get your medication covered by your insurance company, please allow us  1-2 business days to complete this process.  Drug prices often vary depending on where the prescription is filled and some pharmacies may offer cheaper prices.  The website www.goodrx.com contains coupons for medications through different pharmacies. The prices here do not account for what the cost may be with help from insurance (it may be cheaper with your insurance), but the  website can give you the price if you did not use any insurance.  - You can print the associated coupon and take it with your prescription to the pharmacy.  - You may also stop by our office during regular business hours and pick up a GoodRx coupon card.  - If you need your prescription sent electronically to a different pharmacy, notify our office through [facility] or by phone at [phone] option 4.     Si Usted Necesita Algo Despus de Su Visita  Tambin puede enviarnos un mensaje a travs de Clinical cytogeneticist. Por lo general respondemos a los mensajes de MyChart en el transcurso de 1 a 2 das hbiles.  Para renovar recetas, por favor pida a su farmacia que se ponga en contacto con nuestra oficina. Randi lakes de fax es [place] [phone].  Si tiene un asunto urgente cuando la clnica est cerrada y que no puede esperar hasta el siguiente da hbil, puede llamar/localizar a su doctor(a) al nmero que aparece a continuacin.   Por favor, tenga en cuenta que aunque hacemos todo lo posible para estar disponibles para asuntos urgentes fuera del horario de [place], no estamos disponibles las 24 horas del da, los 7 [address]  [address] de la [place].   Si tiene un problema urgente y no puede comunicarse con nosotros, puede optar por buscar atencin mdica  en el consultorio de su doctor(a), en una clnica privada, en un centro de atencin urgente o en una sala de emergencias.  Si tiene Engineer, drilling, por favor llame inmediatamente al 911 o vaya a la sala de emergencias.  Nmeros de bper  - Dr. Hester: [phone]  - Dra. Jackquline: [phone]  - Dr. Claudene: [phone]  - Dra. Kitts: [phone]  En caso de inclemencias del [place], por favor llame a nuestra lnea principal al [phone] para una actualizacin sobre el estado de cualquier retraso o cierre.  Consejos para la medicacin en dermatologa: Por favor, guarde las cajas en las que vienen los medicamentos de uso tpico para  ayudarle a seguir las instrucciones sobre dnde y cmo usarlos. Las farmacias generalmente imprimen las instrucciones del medicamento slo en las cajas y no directamente en los tubos del [place].   Si su medicamento es muy caro, por favor, pngase en contacto con landry rieger llamando al [phone] y presione la opcin 4 o envenos un mensaje a travs de Clinical cytogeneticist.   No podemos decirle cul ser su copago por los medicamentos por adelantado ya que esto es diferente dependiendo de la cobertura de su seguro. Sin embargo, es posible que podamos encontrar un medicamento sustituto a Audiological scientist un formulario para que el seguro cubra el medicamento que se considera necesario.   Si se requiere una autorizacin previa para que su compaa de seguros malta su medicamento, por favor permtanos de 1 a 2 das hbiles para completar este proceso.  Los precios de los medicamentos varan con frecuencia dependiendo del Environmental consultant de dnde se surte la receta y iraq  pueden ofrecer precios ms baratos.  El sitio web www.goodrx.com tiene cupones para medicamentos de Health and safety inspector. Los precios aqu no tienen en cuenta lo que podra costar con la ayuda del seguro (puede ser ms barato con su seguro), pero el sitio web puede darle el precio si no utiliz Tourist information centre manager.  - Puede imprimir el cupn correspondiente y llevarlo con su receta a la farmacia.  - Tambin puede pasar por nuestra oficina durante el horario de atencin regular y Education officer, museum una tarjeta de cupones de GoodRx.  - Si necesita que su receta se enve electrnicamente a una farmacia diferente, informe a nuestra oficina a travs de MyChart de [place] o por telfono llamando al [phone] y presione la opcin 4.

## 2024-06-09 NOTE — Progress Notes (Signed)
 Follow-Up Visit   Subjective  Alexander Carey is a 73 y.o. male who presents for the following: Skin Cancer Screening and Full Body Skin Exam hx of BCCs, AKs  Patient accompanied by wife.  The patient presents for Total-Body Skin Exam (TBSE) for skin cancer screening and mole check. The patient has spots, moles and lesions to be evaluated, some may be new or changing and the patient may have concern these could be cancer.    The following portions of the chart were reviewed this encounter and updated as appropriate: medications, allergies, medical history  Review of Systems:  No other skin or systemic complaints except as noted in HPI or Assessment and Plan.  Objective  Well appearing patient in no apparent distress; mood and affect are within normal limits.  A full examination was performed including scalp, head, eyes, ears, nose, lips, neck, chest, axillae, abdomen, back, buttocks, bilateral upper extremities, bilateral lower extremities, hands, feet, fingers, toes, fingernails, and toenails. All findings within normal limits unless otherwise noted below.   Relevant physical exam findings are noted in the Assessment and Plan.  R helix x 3, R antehelix x 1, R preauricular x 1, R temple x 2, R lat brow x 1, L temple x 1, L sideburn x 2, L preauricular x 1 (12) Pink scaly macules  Assessment & Plan   SKIN CANCER SCREENING PERFORMED TODAY.  ACTINIC DAMAGE - Chronic condition, secondary to cumulative UV/sun exposure - diffuse scaly erythematous macules with underlying dyspigmentation - Recommend daily broad spectrum sunscreen SPF 30+ to sun-exposed areas, reapply every 2 hours as needed.  - Staying in the shade or wearing long sleeves, sun glasses (UVA+UVB protection) and wide brim hats (4-inch brim around the entire circumference of the hat) are also recommended for sun protection.  - Call for new or changing lesions.  LENTIGINES, SEBORRHEIC KERATOSES, HEMANGIOMAS - Benign normal  skin lesions - Benign-appearing - Call for any changes  MELANOCYTIC NEVI - Tan-brown and/or pink-flesh-colored symmetric macules and papules - Benign appearing on exam today - Observation - Call clinic for new or changing moles - Recommend daily use of broad spectrum spf 30+ sunscreen to sun-exposed areas.   HISTORY OF BASAL CELL CARCINOMA OF THE SKIN - No evidence of recurrence today - Recommend regular full body skin exams - Recommend daily broad spectrum sunscreen SPF 30+ to sun-exposed areas, reapply every 2 hours as needed.  - Call if any new or changing lesions are noted between office visits  - Nasal tip, R ear concha, L lower cutaneous lip sup, L lower cutaneous lip inferior, L helix mohs 03/2024 clear today  TRAUMATIC INJURY Nasal tip Exam: excoriation nasal tip  Treatment Plan: Observe, advised pt if does not heal rtc for re-evaluation  TINEA PEDIS / TINEA CORPORIS Bil feet, buttocks Exam: Scaling and maceration web spaces and over distal and lateral soles. Annular red scaly plaques buttocks b/l  Treatment Plan: Start otc Terbinafine cream bid for at least 6 wks to feet and buttocks AK (ACTINIC KERATOSIS) (12) R helix x 3, R antehelix x 1, R preauricular x 1, R temple x 2, R lat brow x 1, L temple x 1, L sideburn x 2, L preauricular x 1 (12) AK distal dorsal nose observe, pt declines LN2 today  Actinic keratoses are precancerous spots that appear secondary to cumulative UV radiation exposure/sun exposure over time. They are chronic with expected duration over 1 year. A portion of actinic keratoses will progress to squamous  cell carcinoma of the skin. It is not possible to reliably predict which spots will progress to skin cancer and so treatment is recommended to prevent development of skin cancer.  Recommend daily broad spectrum sunscreen SPF 30+ to sun-exposed areas, reapply every 2 hours as needed.  Recommend staying in the shade or wearing long sleeves, sun glasses  (UVA+UVB protection) and wide brim hats (4-inch brim around the entire circumference of the hat). Call for new or changing lesions. Destruction of lesion - R helix x 3, R antehelix x 1, R preauricular x 1, R temple x 2, R lat brow x 1, L temple x 1, L sideburn x 2, L preauricular x 1 (12) Complexity: simple   Destruction method: cryotherapy   Informed consent: discussed and consent obtained   Timeout:  patient name, date of birth, surgical site, and procedure verified Lesion destroyed using liquid nitrogen: Yes   Region frozen until ice ball extended beyond lesion: Yes   Cryo cycles: 1 or 2. Outcome: patient tolerated procedure well with no complications   Post-procedure details: wound care instructions given    MULTIPLE BENIGN NEVI   LENTIGINES   ACTINIC ELASTOSIS   SEBORRHEIC KERATOSES   CHERRY ANGIOMA   TINEA PEDIS OF BOTH FEET   TINEA CORPORIS   EXCORIATION   Return in about 1 year (around 06/09/2025) for TBSE, Hx of BCC, Hx of AKs.  I, Grayce Saunas, RMA, am acting as scribe for Boneta Sharps, MD .   Documentation: I have reviewed the above documentation for accuracy and completeness, and I agree with the above.  Boneta Sharps, MD

## 2024-07-18 ENCOUNTER — Other Ambulatory Visit: Payer: Self-pay | Admitting: Urology

## 2024-07-18 DIAGNOSIS — N138 Other obstructive and reflux uropathy: Secondary | ICD-10-CM

## 2024-07-25 ENCOUNTER — Other Ambulatory Visit: Payer: Self-pay | Admitting: Urology

## 2024-07-25 DIAGNOSIS — N138 Other obstructive and reflux uropathy: Secondary | ICD-10-CM

## 2024-09-01 ENCOUNTER — Encounter: Payer: Self-pay | Admitting: Urology

## 2024-10-31 ENCOUNTER — Ambulatory Visit: Payer: PPO | Admitting: Urology

## 2025-06-12 ENCOUNTER — Ambulatory Visit: Admitting: Dermatology
